# Patient Record
Sex: Male | Born: 1945 | Race: Black or African American | Hispanic: No | Marital: Single | Smoking: Former smoker
Health system: Southern US, Community
[De-identification: ages and names within clinical notes are randomized; demographics above are authoritative.]

## PROBLEM LIST (undated history)

## (undated) DIAGNOSIS — K219 Gastro-esophageal reflux disease without esophagitis: Secondary | ICD-10-CM

## (undated) DIAGNOSIS — N400 Enlarged prostate without lower urinary tract symptoms: Secondary | ICD-10-CM

## (undated) DIAGNOSIS — I10 Essential (primary) hypertension: Secondary | ICD-10-CM

## (undated) DIAGNOSIS — Z860100 Personal history of colon polyps, unspecified: Secondary | ICD-10-CM

## (undated) DIAGNOSIS — J449 Chronic obstructive pulmonary disease, unspecified: Secondary | ICD-10-CM

## (undated) DIAGNOSIS — E7800 Pure hypercholesterolemia, unspecified: Secondary | ICD-10-CM

## (undated) DIAGNOSIS — Z809 Family history of malignant neoplasm, unspecified: Secondary | ICD-10-CM

## (undated) HISTORY — PX: CHOLECYSTECTOMY: SHX55

## (undated) HISTORY — DX: Personal history of colon polyps, unspecified: Z86.0100

## (undated) HISTORY — DX: Personal history of colonic polyps: Z86.010

## (undated) HISTORY — DX: Family history of malignant neoplasm, unspecified: Z80.9

---

## 2005-02-22 ENCOUNTER — Inpatient Hospital Stay: Payer: Self-pay

## 2005-02-22 ENCOUNTER — Other Ambulatory Visit: Payer: Self-pay

## 2005-05-20 ENCOUNTER — Ambulatory Visit: Payer: Self-pay | Admitting: General Practice

## 2013-05-01 ENCOUNTER — Ambulatory Visit: Payer: Self-pay | Admitting: Family Medicine

## 2014-08-12 DIAGNOSIS — N400 Enlarged prostate without lower urinary tract symptoms: Secondary | ICD-10-CM | POA: Insufficient documentation

## 2014-08-12 DIAGNOSIS — N401 Enlarged prostate with lower urinary tract symptoms: Secondary | ICD-10-CM | POA: Insufficient documentation

## 2017-10-03 ENCOUNTER — Other Ambulatory Visit: Payer: Self-pay | Admitting: Nurse Practitioner

## 2017-10-03 DIAGNOSIS — R05 Cough: Secondary | ICD-10-CM

## 2017-10-03 DIAGNOSIS — R053 Chronic cough: Secondary | ICD-10-CM

## 2017-10-10 ENCOUNTER — Ambulatory Visit
Admission: RE | Admit: 2017-10-10 | Discharge: 2017-10-10 | Disposition: A | Discharge status: Home / Self Care | Payer: Commercial Managed Care - HMO | Source: Ambulatory Visit | Attending: Nurse Practitioner | Admitting: Nurse Practitioner

## 2017-10-10 DIAGNOSIS — R05 Cough: Secondary | ICD-10-CM | POA: Diagnosis present

## 2017-10-10 DIAGNOSIS — J449 Chronic obstructive pulmonary disease, unspecified: Secondary | ICD-10-CM | POA: Diagnosis not present

## 2017-10-10 DIAGNOSIS — R053 Chronic cough: Secondary | ICD-10-CM

## 2017-10-10 DIAGNOSIS — I700 Atherosclerosis of aorta: Secondary | ICD-10-CM | POA: Insufficient documentation

## 2018-05-17 ENCOUNTER — Encounter: Payer: Self-pay | Admitting: *Deleted

## 2018-05-17 ENCOUNTER — Telehealth: Payer: Self-pay | Admitting: *Deleted

## 2018-05-17 DIAGNOSIS — Z122 Encounter for screening for malignant neoplasm of respiratory organs: Secondary | ICD-10-CM

## 2018-05-17 NOTE — Telephone Encounter (Signed)
Received a referral for initial lung cancer screening scan.  Contacted the patient and obtained their smoking history, former smoker quit 3 months ago with 42.75pkyr history  as well as answering questions related to screening process.  Patient denies signs of lung cancer such as weight loss or hemoptysis at this time.  Patient denies comorbidity that would prevent curative treatment if lung cancer were found.  Patient is scheduled for the Shared Decision Making Visit and CT scan on 06-15-18@1400 .

## 2018-05-19 ENCOUNTER — Emergency Department
Admission: EM | Admit: 2018-05-19 | Discharge: 2018-05-19 | Disposition: A | Discharge status: Home / Self Care | Payer: Medicare Other | Attending: Emergency Medicine | Admitting: Emergency Medicine

## 2018-05-19 ENCOUNTER — Other Ambulatory Visit: Payer: Self-pay

## 2018-05-19 ENCOUNTER — Emergency Department: Payer: Medicare Other

## 2018-05-19 ENCOUNTER — Encounter: Payer: Self-pay | Admitting: Emergency Medicine

## 2018-05-19 DIAGNOSIS — R0789 Other chest pain: Secondary | ICD-10-CM | POA: Insufficient documentation

## 2018-05-19 DIAGNOSIS — Z87891 Personal history of nicotine dependence: Secondary | ICD-10-CM | POA: Insufficient documentation

## 2018-05-19 DIAGNOSIS — I10 Essential (primary) hypertension: Secondary | ICD-10-CM | POA: Diagnosis not present

## 2018-05-19 HISTORY — DX: Essential (primary) hypertension: I10

## 2018-05-19 HISTORY — DX: Gastro-esophageal reflux disease without esophagitis: K21.9

## 2018-05-19 HISTORY — DX: Pure hypercholesterolemia, unspecified: E78.00

## 2018-05-19 LAB — CBC
HCT: 40.7 % (ref 39.0–52.0)
Hemoglobin: 12.6 g/dL — ABNORMAL LOW (ref 13.0–17.0)
MCH: 23.4 pg — AB (ref 26.0–34.0)
MCHC: 31 g/dL (ref 30.0–36.0)
MCV: 75.5 fL — ABNORMAL LOW (ref 80.0–100.0)
NRBC: 0 % (ref 0.0–0.2)
Platelets: 247 10*3/uL (ref 150–400)
RBC: 5.39 MIL/uL (ref 4.22–5.81)
RDW: 21.3 % — ABNORMAL HIGH (ref 11.5–15.5)
WBC: 4.2 10*3/uL (ref 4.0–10.5)

## 2018-05-19 LAB — BASIC METABOLIC PANEL
ANION GAP: 7 (ref 5–15)
BUN: 22 mg/dL (ref 8–23)
CO2: 24 mmol/L (ref 22–32)
Calcium: 9.1 mg/dL (ref 8.9–10.3)
Chloride: 109 mmol/L (ref 98–111)
Creatinine, Ser: 1.74 mg/dL — ABNORMAL HIGH (ref 0.61–1.24)
GFR calc Af Amer: 44 mL/min — ABNORMAL LOW (ref >60)
GFR, EST NON AFRICAN AMERICAN: 38 mL/min — AB (ref >60)
GLUCOSE: 144 mg/dL — AB (ref 70–99)
POTASSIUM: 3.8 mmol/L (ref 3.5–5.1)
Sodium: 140 mmol/L (ref 135–145)

## 2018-05-19 LAB — TROPONIN I

## 2018-05-19 MED ORDER — HYOSCYAMINE SULFATE 0.125 MG SL SUBL
0.2500 mg | SUBLINGUAL_TABLET | Freq: Once | SUBLINGUAL | Status: AC
  Administered 2018-05-19: 0.25 mg via SUBLINGUAL
  Filled 2018-05-19: qty 2

## 2018-05-19 MED ORDER — ALUM HYDROXIDE-MAG CARBONATE 95-358 MG/15ML PO SUSP: 15.0000 mL | Freq: Three times a day (TID) | ORAL | 2 refills | Status: AC

## 2018-05-19 MED ORDER — LIDOCAINE VISCOUS HCL 2 % MT SOLN
15.0000 mL | Freq: Once | OROMUCOSAL | Status: AC
  Administered 2018-05-19: 15 mL via ORAL
  Filled 2018-05-19: qty 15

## 2018-05-19 MED ORDER — SUCRALFATE 1 GM/10ML PO SUSP: 1.0000 g | Freq: Four times a day (QID) | ORAL | 1 refills | Status: DC

## 2018-05-19 MED ORDER — ALUM & MAG HYDROXIDE-SIMETH 200-200-20 MG/5ML PO SUSP
30.0000 mL | Freq: Once | ORAL | Status: AC
  Administered 2018-05-19: 30 mL via ORAL
  Filled 2018-05-19: qty 30

## 2018-05-19 NOTE — ED Provider Notes (Signed)
[facility] Emergency Department Provider Note   ____________________________________________   First MD Initiated Contact with Patient 05/19/18 [phone]     (approximate)  I have reviewed the triage vital signs and the nursing notes.   HISTORY  Chief Complaint Chest Pain   HPI Donald Foster is a 72 y.o. male patient reports center chest pain which is sharp and burning.  It is woken him up at night several times in the last few nights.  He gets better if he sits up.  It was getting better if he belches before but it is not anymore.  It is not worse or brought on at all by exercise or deep breathing.  In fact deep breathing makes the pain better.  Pain was moderately severe when it was there.  It went away with GI cocktail.  Is not associated with nausea vomiting sweatiness or shortness of breath.  It does not radiate.   Past Medical History:  Diagnosis Date  . GERD (gastroesophageal reflux disease)   . Hypercholesteremia   . Hypertension     There are no active problems to display for this patient.   Past Surgical History:  Procedure Laterality Date  . CHOLECYSTECTOMY      Prior to Admission medications   Medication Sig Start Date End Date Taking? Authorizing Provider  aluminum hydroxide-magnesium carbonate (GAVISCON) 95-358 MG/15ML SUSP Take 15 mLs by mouth 4 (four) times daily - after meals and at bedtime. 05/19/18   [name], MD  sucralfate (CARAFATE) 1 GM/10ML suspension Take 10 mLs (1 g total) by mouth 4 (four) times daily. 05/19/18 05/19/19  [name], MD    Allergies Patient has no known allergies.  No family history on file.  Social History Social History   Tobacco Use  . Smoking status: Former Smoker    Packs/day: 0.75    Years: 57.00    Pack years: 42.75    Types: Cigarettes    Start date: 05/03/2018  . Smokeless tobacco: Never Used  Substance Use Topics  . Alcohol use: Not Currently  . Drug use: Not Currently     Review of Systems  Constitutional: No fever/chills Eyes: No visual changes. ENT: No sore throat. Cardiovascular: See HPI Respiratory: Denies shortness of breath. Gastrointestinal: No abdominal pain.  No nausea, no vomiting.  No diarrhea.  No constipation. Genitourinary: Negative for dysuria. Musculoskeletal: Negative for back pain. Skin: Negative for rash. Neurological: Negative for headaches, focal weakness  ____________________________________________   PHYSICAL EXAM:  VITAL SIGNS: ED Triage Vitals  Enc Vitals Group     BP 05/19/18 0914 (!) 144/76     Pulse Rate 05/19/18 0914 95     Resp 05/19/18 0914 16     Temp 05/19/18 0914 98.1 F (36.7 C)     Temp Source 05/19/18 0914 Oral     SpO2 05/19/18 0914 95 %     Weight 05/19/18 0915 144 lb (65.3 kg)     Height 05/19/18 0915 5\' 8"  (1.727 m)     Head Circumference --      Peak Flow --      Pain Score 05/19/18 0914 8     Pain Loc --      Pain Edu? --      Excl. in [place]? --     Constitutional: Alert and oriented. Well appearing and in no acute distress. Eyes: Conjunctivae are normal.  Head: Atraumatic. Nose: No congestion/rhinnorhea. Mouth/Throat: Mucous membranes are moist.  Oropharynx non-erythematous.  Neck: No stridor.   Cardiovascular: Normal rate, regular rhythm. Grossly normal heart sounds.  Good peripheral circulation.  Pain is partly reproduced by palpation over the costochondral junctions in the mid chest Respiratory: Normal respiratory effort.  No retractions. Lungs CTAB. Gastrointestinal: Soft and nontender. No distention. No abdominal bruits. No CVA tenderness. Musculoskeletal: No lower extremity tenderness nor edema.   Neurologic:  Normal speech and language. No gross focal neurologic deficits are appreciated. No gait instability. Skin:  Skin is warm, dry and intact. No rash noted. Psychiatric: Mood and affect are normal. Speech and behavior are normal.  ____________________________________________    LABS (all labs ordered are listed, but only abnormal results are displayed)  Labs Reviewed  BASIC METABOLIC PANEL - Abnormal; Notable for the following components:      Result Value   Glucose, Bld 144 (*)    Creatinine, Ser 1.74 (*)    GFR calc non Af Amer 38 (*)    GFR calc Af Amer 44 (*)    All other components within normal limits  CBC - Abnormal; Notable for the following components:   Hemoglobin 12.6 (*)    MCV 75.5 (*)    MCH 23.4 (*)    RDW 21.3 (*)    All other components within normal limits  TROPONIN I  HEPATIC FUNCTION PANEL  DIFFERENTIAL   ____________________________________________  EKG  EKG read and interpreted by me shows normal sinus rhythm rate of 100 normal axis no acute ST-T wave changes are 4 PVCs present. ____________________________________________  RADIOLOGY  ED MD interpretation: Chest x-ray read by radiology reviewed by me shows minimal atelectasis  Official radiology report(s): Dg Chest Portable 1 View  Result Date: 05/19/2018 CLINICAL DATA:  Chest pain.  Chronic shortness of breath. EXAM: PORTABLE CHEST 1 VIEW COMPARISON:  10/10/2017 and 05/01/2013 FINDINGS: Heart size and pulmonary vascularity are normal. There is tortuosity and calcification of the brachiocephalic vessels and thoracic aorta. There is slight atelectasis at both lung bases. No effusions. No acute bone abnormality. IMPRESSION: 1. Minimal atelectasis at the lung bases. 2.  Aortic Atherosclerosis (ICD10-I70.0). 3. No other significant abnormalities. Electronically Signed   By: Lorriane Shire M.D.   On: 05/19/2018 12:12    ____________________________________________   PROCEDURES  Procedure(s) performed:   Procedures  Critical Care performed:   ____________________________________________   INITIAL IMPRESSION / ASSESSMENT AND PLAN / ED COURSE  Patient's pain wakes him up at night but it does not come on with any sort of exertion or deep breathing.  It is burning in  nature often sounds like reflux.  He is already being treated for.  I will add some Gaviscon and if that does not work then some Carafate to it.  He will follow-up with his doctor and the GI doctor.  Return if he is worse.      ____________________________________________   FINAL CLINICAL IMPRESSION(S) / ED DIAGNOSES  Final diagnoses:  Other chest pain     ED Discharge Orders         Ordered    sucralfate (CARAFATE) 1 GM/10ML suspension  4 times daily     05/19/18 1144    aluminum hydroxide-magnesium carbonate (GAVISCON) 95-358 MG/15ML SUSP  3 times daily after meals & bedtime     05/19/18 1148           Note:  This document was prepared using Dragon voice recognition software and may include unintentional dictation errors.    [name], MD 05/19/18 [phone]

## 2018-05-19 NOTE — Discharge Instructions (Addendum)
I think your pain is from reflux. Please continue taking the protonix once a day. Please try Gaviscon after meals and especially before bed. You can also take it once a night if the pain wakes you up from sleep.If this doesn't work after 2-3 days try the Carafate instead. Please make sure to take the carafate 2 hours before or after any other medicine because the carafate will absorb other medications and keep them from working. Please follow  up with your doctor and with the gastroenterologist, Dr Marius Ditch. Please return here if you are worse.

## 2018-05-19 NOTE — ED Triage Notes (Signed)
Pt to ED via POV c/o chest pain on and off x 2 weeks. Pt states that the pain has progressively gotten worse to where it is waking him up at night. Pt denies N/V. Pt states that he is short of breath but that this is baseline for him due to COPD. Pt does not feel like his ShOB has gotten any worse. Pt states that the chest pain is located in the center to right of his chest. Pt denies radiation of the pain. Pt is in NAD at this time.

## 2018-05-23 ENCOUNTER — Telehealth: Payer: Self-pay | Admitting: Gastroenterology

## 2018-05-23 NOTE — Telephone Encounter (Signed)
Left vm for pt to call office and schedule ed f/u with Dr. Marius Ditch

## 2018-06-15 ENCOUNTER — Ambulatory Visit: Admission: RE | Admit: 2018-06-15 | Payer: Medicare Other | Source: Ambulatory Visit

## 2018-06-15 ENCOUNTER — Inpatient Hospital Stay: Payer: Medicare Other | Attending: Nurse Practitioner | Admitting: Nurse Practitioner

## 2018-06-20 ENCOUNTER — Encounter: Payer: Self-pay | Admitting: *Deleted

## 2018-07-21 ENCOUNTER — Ambulatory Visit (INDEPENDENT_AMBULATORY_CARE_PROVIDER_SITE_OTHER): Payer: Medicare HMO | Admitting: Gastroenterology

## 2018-07-21 ENCOUNTER — Other Ambulatory Visit
Admission: RE | Admit: 2018-07-21 | Discharge: 2018-07-21 | Disposition: A | Discharge status: Home / Self Care | Payer: Medicare HMO | Source: Ambulatory Visit | Attending: Gastroenterology | Admitting: Gastroenterology

## 2018-07-21 ENCOUNTER — Other Ambulatory Visit: Payer: Self-pay

## 2018-07-21 ENCOUNTER — Encounter: Payer: Self-pay | Admitting: Gastroenterology

## 2018-07-21 VITALS — BP 161/92 | HR 84 | Resp 17 | Wt 145.0 lb

## 2018-07-21 DIAGNOSIS — K219 Gastro-esophageal reflux disease without esophagitis: Secondary | ICD-10-CM

## 2018-07-21 DIAGNOSIS — R0789 Other chest pain: Secondary | ICD-10-CM | POA: Diagnosis not present

## 2018-07-21 DIAGNOSIS — D509 Iron deficiency anemia, unspecified: Secondary | ICD-10-CM

## 2018-07-21 LAB — CBC
HCT: 43 % (ref 39.0–52.0)
Hemoglobin: 13.3 g/dL (ref 13.0–17.0)
MCH: 23.7 pg — ABNORMAL LOW (ref 26.0–34.0)
MCHC: 30.9 g/dL (ref 30.0–36.0)
MCV: 76.6 fL — ABNORMAL LOW (ref 80.0–100.0)
Platelets: 225 10*3/uL (ref 150–400)
RBC: 5.61 MIL/uL (ref 4.22–5.81)
RDW: 19.3 % — ABNORMAL HIGH (ref 11.5–15.5)
WBC: 5 10*3/uL (ref 4.0–10.5)
nRBC: 0 % (ref 0.0–0.2)

## 2018-07-21 LAB — COMPREHENSIVE METABOLIC PANEL
ALT: 24 U/L (ref 0–44)
AST: 24 U/L (ref 15–41)
Albumin: 4.3 g/dL (ref 3.5–5.0)
Alkaline Phosphatase: 58 U/L (ref 38–126)
Anion gap: 7 (ref 5–15)
BUN: 22 mg/dL (ref 8–23)
CO2: 24 mmol/L (ref 22–32)
Calcium: 9.2 mg/dL (ref 8.9–10.3)
Chloride: 107 mmol/L (ref 98–111)
Creatinine, Ser: 1.64 mg/dL — ABNORMAL HIGH (ref 0.61–1.24)
GFR calc Af Amer: 48 mL/min — ABNORMAL LOW (ref >60)
GFR calc non Af Amer: 42 mL/min — ABNORMAL LOW (ref >60)
Glucose, Bld: 97 mg/dL (ref 70–99)
Potassium: 4.2 mmol/L (ref 3.5–5.1)
Sodium: 138 mmol/L (ref 135–145)
Total Bilirubin: 0.7 mg/dL (ref 0.3–1.2)
Total Protein: 7.8 g/dL (ref 6.5–8.1)

## 2018-07-21 LAB — IRON AND TIBC
Iron: 36 ug/dL — ABNORMAL LOW (ref 45–182)
Saturation Ratios: 10 % — ABNORMAL LOW (ref 17.9–39.5)
TIBC: 371 ug/dL (ref 250–450)
UIBC: 335 ug/dL

## 2018-07-21 LAB — VITAMIN B12: Vitamin B-12: 305 pg/mL (ref 180–914)

## 2018-07-21 LAB — FERRITIN: Ferritin: 14 ng/mL — ABNORMAL LOW (ref 24–336)

## 2018-07-21 LAB — FOLATE: Folate: 17.2 ng/mL (ref >5.9)

## 2018-07-21 NOTE — Progress Notes (Signed)
[name], MD [address]  [place]  [place], [place] [address]  Main: [phone]  Fax: [phone]    Gastroenterology Consultation  Referring Provider:     Elisabeth Cara, NP Primary Care Physician:  Elisabeth Cara, NP Primary Gastroenterologist:  Dr. [name] Reason for Consultation:     Atypical chest pain, heartburn        HPI:   BACH ROCCHI is a 73 y.o. male referred by Dr. Elisabeth Cara, NP  for consultation & management of atypical chest pain and heartburn.  Patient was recently seen in the ER in 05/2018 secondary to chest pain.  EKG, troponins were negative.  Therefore, he was referred to GI for further evaluation.  He was given Gaviscon in the ER. Patient is currently taking omeprazole 20 mg daily.  He also reports heartburn and regurgitation which is relieved by taking PPI daily.  He denies dysphagia, weight loss.  He denies any other GI symptoms  NSAIDs: None  Antiplts/Anticoagulants/Anti thrombotics: None  GI Procedures:  Reports having stool card test annually and has been negative to date  Colonoscopy in 08/1990 at [facility] The procedure impressions are; 6 polyps, 1 mm in diameter to 10 mm in diameter, from the transverse colon, the splenic flexure, and the sigmoid colon. DIAGNOSIS COLON, PROXIMAL TRANSVERSE, BIOPSY - ADENOMATOUS POLYP COLON, SPLENIC FLEXURE, BIOPSY - ADENOMATOUS POLYPS COLON, SIGMOID, BIOPSY - ADENOMATOUS POLYPS  Colonoscopy 09/1991 The patient is a 73 year old black male who undergoes colonoscopy. Snare polypectomy of a 4x4 mm polyp at the hepatic flexure is performed.  DIAGNOSIS COLON, ENDOSCOPIC BIOPSY - ADENOMATOUS POLYP, 4 x 4 millimeter   Past Medical History:  Diagnosis Date  . GERD (gastroesophageal reflux disease)   . Hypercholesteremia   . Hypertension     Past Surgical History:  Procedure Laterality Date  . CHOLECYSTECTOMY      Current Outpatient Medications:  .  aluminum  hydroxide-magnesium carbonate (GAVISCON) 95-358 MG/15ML SUSP, Take 15 mLs by mouth 4 (four) times daily - after meals and at bedtime., Disp: 1 Bottle, Rfl: 2 .  amLODipine (NORVASC) 10 MG tablet, , Disp: , Rfl:  .  atorvastatin (LIPITOR) 20 MG tablet, , Disp: , Rfl:  .  lisinopril (PRINIVIL,ZESTRIL) 20 MG tablet, , Disp: , Rfl:  .  losartan (COZAAR) 50 MG tablet, , Disp: , Rfl:  .  omeprazole (PRILOSEC) 20 MG capsule, , Disp: , Rfl:  .  pantoprazole (PROTONIX) 40 MG tablet, , Disp: , Rfl:  .  SPIRIVA HANDIHALER 18 MCG inhalation capsule, , Disp: , Rfl:  .  tamsulosin (FLOMAX) 0.4 MG CAPS capsule, , Disp: , Rfl:  .  amLODipine (NORVASC) 5 MG tablet, , Disp: , Rfl:  .  sucralfate (CARAFATE) 1 GM/10ML suspension, Take 10 mLs (1 g total) by mouth 4 (four) times daily. (Patient not taking: Reported on 07/21/2018), Disp: 420 mL, Rfl: 1 .  tamsulosin (FLOMAX) 0.4 MG CAPS capsule, Take by mouth., Disp: , Rfl:   No family history on file.   Social History   Tobacco Use  . Smoking status: Former Smoker    Packs/day: 0.75    Years: 57.00    Pack years: 42.75    Types: Cigarettes    Start date: 05/03/2018  . Smokeless tobacco: Never Used  Substance Use Topics  . Alcohol use: Not Currently  . Drug use: Not Currently    Allergies as of 07/21/2018  . (No Known Allergies)    Review of Systems:  All systems reviewed and negative except where noted in HPI.   Physical Exam:  BP (!) 161/92 (BP Location: Left Arm, Patient Position: Sitting, Cuff Size: Large)   Pulse 84   Resp 17   Wt 145 lb (65.8 kg)   BMI 22.05 kg/m  No LMP for male patient.  General:   Alert,  Well-developed, well-nourished, pleasant and cooperative in NAD Head:  Normocephalic and atraumatic. Eyes:  Sclera clear, no icterus.   Conjunctiva pink. Ears:  Normal auditory acuity. Nose:  No deformity, discharge, or lesions. Mouth:  No deformity or lesions,oropharynx pink & moist. Neck:  Supple; no masses or  thyromegaly. Lungs:  Respirations even and unlabored.  Clear throughout to auscultation.   No wheezes, crackles, or rhonchi. No acute distress. Heart:  Regular rate and rhythm; no murmurs, clicks, rubs, or gallops. Abdomen:  Normal bowel sounds. Soft, non-tender and non-distended without masses, hepatosplenomegaly or hernias noted.  No guarding or rebound tenderness.   Rectal: Not performed Msk:  Symmetrical without gross deformities. Good, equal movement & strength bilaterally. Pulses:  Normal pulses noted. Extremities:  No clubbing or edema.  No cyanosis. Neurologic:  Alert and oriented x3;  grossly normal neurologically. Skin:  Intact without significant lesions or rashes. No jaundice. Psych:  Alert and cooperative. Normal mood and affect.  Imaging Studies: No abdominal imaging  Assessment and Plan:   BUBBER ROTHERT is a 73 y.o. African-American male with history of hypertension, BPH, adenomatous polyps of the colon presents with atypical chest pain, heartburn and regurgitation, maintained on daily PPI.  Patient is referred from ER due to atypical chest pain which is ongoing despite being on PPI  Atypical chest pain with GERD: Recommend EGD for further evaluation Continue omeprazole 20mg  daily  Adenomatous polyps of the colon based on colonoscopy from [place] in 1993 at [facility] Patient reports that he has been receiving annual stool card test by mail and has been negative to date.  He just performed it about 1-2 months ago.  I do not have results of the stool card test with me today  I will discuss with patient about surveillance colonoscopy given his history of adenomatous polyps in the past  Microcytic anemia CBC, iron studies, B12 and folate levels He will need colonoscopy along with EGD if he has iron deficiency  Follow up in 2 months   [name], MD

## 2018-07-27 ENCOUNTER — Telehealth: Payer: Self-pay | Admitting: Gastroenterology

## 2018-07-27 NOTE — Telephone Encounter (Signed)
Pt sister is calling to set up colonoscopy for pt while having another procedure  Please call her back

## 2018-08-01 ENCOUNTER — Encounter
Admission: RE | Disposition: A | Discharge status: Home / Self Care | Payer: Self-pay | Source: Home / Self Care | Attending: Gastroenterology

## 2018-08-01 ENCOUNTER — Ambulatory Visit
Admission: RE | Admit: 2018-08-01 | Discharge: 2018-08-01 | Disposition: A | Discharge status: Home / Self Care | Payer: Medicare HMO | Attending: Gastroenterology | Admitting: Gastroenterology

## 2018-08-01 ENCOUNTER — Ambulatory Visit: Payer: Medicare HMO | Admitting: Anesthesiology

## 2018-08-01 ENCOUNTER — Other Ambulatory Visit: Payer: Self-pay

## 2018-08-01 ENCOUNTER — Encounter: Payer: Self-pay | Admitting: *Deleted

## 2018-08-01 DIAGNOSIS — K449 Diaphragmatic hernia without obstruction or gangrene: Secondary | ICD-10-CM | POA: Insufficient documentation

## 2018-08-01 DIAGNOSIS — I10 Essential (primary) hypertension: Secondary | ICD-10-CM | POA: Insufficient documentation

## 2018-08-01 DIAGNOSIS — K31811 Angiodysplasia of stomach and duodenum with bleeding: Secondary | ICD-10-CM | POA: Diagnosis not present

## 2018-08-01 DIAGNOSIS — K219 Gastro-esophageal reflux disease without esophagitis: Secondary | ICD-10-CM | POA: Insufficient documentation

## 2018-08-01 DIAGNOSIS — D509 Iron deficiency anemia, unspecified: Secondary | ICD-10-CM | POA: Diagnosis not present

## 2018-08-01 DIAGNOSIS — E7800 Pure hypercholesterolemia, unspecified: Secondary | ICD-10-CM | POA: Diagnosis not present

## 2018-08-01 DIAGNOSIS — Z79899 Other long term (current) drug therapy: Secondary | ICD-10-CM | POA: Diagnosis not present

## 2018-08-01 DIAGNOSIS — R0789 Other chest pain: Secondary | ICD-10-CM | POA: Insufficient documentation

## 2018-08-01 DIAGNOSIS — K31819 Angiodysplasia of stomach and duodenum without bleeding: Secondary | ICD-10-CM | POA: Diagnosis present

## 2018-08-01 DIAGNOSIS — K222 Esophageal obstruction: Secondary | ICD-10-CM | POA: Insufficient documentation

## 2018-08-01 DIAGNOSIS — Z87891 Personal history of nicotine dependence: Secondary | ICD-10-CM | POA: Insufficient documentation

## 2018-08-01 DIAGNOSIS — D500 Iron deficiency anemia secondary to blood loss (chronic): Secondary | ICD-10-CM | POA: Diagnosis not present

## 2018-08-01 HISTORY — PX: ESOPHAGOGASTRODUODENOSCOPY (EGD) WITH PROPOFOL: SHX5813

## 2018-08-01 SURGERY — ESOPHAGOGASTRODUODENOSCOPY (EGD) WITH PROPOFOL
Anesthesia: General

## 2018-08-01 MED ORDER — LIDOCAINE HCL (CARDIAC) PF 100 MG/5ML IV SOSY
PREFILLED_SYRINGE | INTRAVENOUS | Status: DC | PRN
  Administered 2018-08-01: 50 mg via INTRAVENOUS

## 2018-08-01 MED ORDER — PROPOFOL 10 MG/ML IV BOLUS
INTRAVENOUS | Status: DC | PRN
  Administered 2018-08-01: 80 mg via INTRAVENOUS

## 2018-08-01 MED ORDER — PROPOFOL 500 MG/50ML IV EMUL
INTRAVENOUS | Status: DC | PRN
  Administered 2018-08-01: 130 ug/kg/min via INTRAVENOUS

## 2018-08-01 MED ORDER — SODIUM CHLORIDE 0.9 % IV SOLN
INTRAVENOUS | Status: DC
  Administered 2018-08-01: 13:00:00 via INTRAVENOUS

## 2018-08-01 MED ORDER — MIDAZOLAM HCL 5 MG/5ML IJ SOLN
INTRAMUSCULAR | Status: AC
  Filled 2018-08-01: qty 5

## 2018-08-01 NOTE — Anesthesia Post-op Follow-up Note (Signed)
Anesthesia QCDR form completed.        

## 2018-08-01 NOTE — H&P (Signed)
Donald Darby, MD [address]  [place]  [place], [place] [address]  Main: [phone]  Fax: [phone] Pager: [phone]  Primary Care Physician:  Elisabeth Cara, NP Primary Gastroenterologist:  Dr. Cephas Foster  Pre-Procedure History & Physical: HPI:  Donald Foster is a 73 y.o. male is here for an endoscopy.   Past Medical History:  Diagnosis Date  . GERD (gastroesophageal reflux disease)   . Hypercholesteremia   . Hypertension     Past Surgical History:  Procedure Laterality Date  . CHOLECYSTECTOMY      Prior to Admission medications   Medication Sig Start Date End Date Taking? Authorizing Provider  aluminum hydroxide-magnesium carbonate (GAVISCON) 95-358 MG/15ML SUSP Take 15 mLs by mouth 4 (four) times daily - after meals and at bedtime. 05/19/18  Yes [name], MD  atorvastatin (LIPITOR) 20 MG tablet  05/03/18  Yes [provider]  lisinopril (PRINIVIL,ZESTRIL) 20 MG tablet  05/11/13  Yes [provider]  losartan (COZAAR) 50 MG tablet  05/03/18  Yes [provider]  omeprazole (PRILOSEC) 20 MG capsule  05/03/18  Yes [provider]  SPIRIVA HANDIHALER 18 MCG inhalation capsule  05/05/18  Yes [provider]  tamsulosin (FLOMAX) 0.4 MG CAPS capsule Take by mouth. 09/23/14  Yes [provider]  amLODipine (NORVASC) 10 MG tablet  05/03/18   [provider]  amLODipine (NORVASC) 5 MG tablet  06/04/14   [provider]  pantoprazole (PROTONIX) 40 MG tablet  05/11/13   [provider]  sucralfate (CARAFATE) 1 GM/10ML suspension Take 10 mLs (1 g total) by mouth 4 (four) times daily. Patient not taking: Reported on 07/21/2018 05/19/18 05/19/19  [name], MD  tamsulosin [facility]) 0.4 MG CAPS capsule  05/03/18   [provider]    Allergies as of 07/21/2018  . (No Known Allergies)    History reviewed. No pertinent family history.  Social History    Socioeconomic History  . Marital status: Single    Spouse name: Not on file  . Number of children: Not on file  . Years of education: Not on file  . Highest education level: Not on file  Occupational History  . Not on file  Social Needs  . Financial resource strain: Not on file  . Food insecurity:    Worry: Not on file    Inability: Not on file  . Transportation needs:    Medical: Not on file    Non-medical: Not on file  Tobacco Use  . Smoking status: Former Smoker    Packs/day: 0.75    Years: 57.00    Pack years: 42.75    Types: Cigarettes    Start date: 05/03/2018  . Smokeless tobacco: Never Used  Substance and Sexual Activity  . Alcohol use: Not Currently  . Drug use: Not Currently  . Sexual activity: Not on file  Lifestyle  . Physical activity:    Days per week: Not on file    Minutes per session: Not on file  . Stress: Not on file  Relationships  . Social connections:    Talks on phone: Not on file    Gets together: Not on file    Attends religious service: Not on file    Active member of club or organization: Not on file    Attends meetings of clubs or organizations: Not on file    Relationship status: Not on file  . Intimate partner violence:    Fear  of current or ex partner: Not on file    Emotionally abused: Not on file    Physically abused: Not on file    Forced sexual activity: Not on file  Other Topics Concern  . Not on file  Social History Narrative  . Not on file    Review of Systems: See HPI, otherwise negative ROS  Physical Exam: BP (!) 165/81   Pulse 75   Temp (!) 97.4 F (36.3 C) (Tympanic)   Resp 17   Ht 5\' 8"  (1.727 m)   Wt 65.3 kg   SpO2 98%   BMI 21.90 kg/m  General:   Alert,  pleasant and cooperative in NAD Head:  Normocephalic and atraumatic. Neck:  Supple; no masses or thyromegaly. Lungs:  Clear throughout to auscultation.    Heart:  Regular rate and rhythm. Abdomen:  Soft, nontender and nondistended. Normal bowel  sounds, without guarding, and without rebound.   Neurologic:  Alert and  oriented x4;  grossly normal neurologically.  Impression/Plan: Donald Foster is here for an endoscopy to be performed for atypical chest pain  Risks, benefits, limitations, and alternatives regarding  endoscopy have been reviewed with the patient.  Questions have been answered.  All parties agreeable.   Sherri Sear, MD  08/01/2018, 1:32 PM

## 2018-08-01 NOTE — Anesthesia Postprocedure Evaluation (Signed)
Anesthesia Post Note  Patient: Donald Foster [facility]  Procedure(s) Performed: ESOPHAGOGASTRODUODENOSCOPY (EGD) WITH PROPOFOL (N/A )  Patient location during evaluation: Endoscopy Anesthesia Type: General Level of consciousness: awake and alert and oriented Pain management: pain level controlled Vital Signs Assessment: post-procedure vital signs reviewed and stable Respiratory status: spontaneous breathing, nonlabored ventilation and respiratory function stable Cardiovascular status: blood pressure returned to baseline and stable Postop Assessment: no signs of nausea or vomiting Anesthetic complications: no     Last Vitals:  Vitals:   08/01/18 1419 08/01/18 1429  BP: 130/75 (!) 146/79  Pulse: 62 66  Resp: 17 19  Temp:    SpO2: 95% 97%    Last Pain:  Vitals:   08/01/18 1429  TempSrc:   PainSc: 0-No pain                 [name]

## 2018-08-01 NOTE — Transfer of Care (Signed)
Immediate Anesthesia Transfer of Care Note  Patient: Donald Foster [facility]  Procedure(s) Performed: ESOPHAGOGASTRODUODENOSCOPY (EGD) WITH PROPOFOL (N/A )  Patient Location: PACU and Endoscopy Unit  Anesthesia Type:General  Level of Consciousness: drowsy  Airway & Oxygen Therapy: Patient Spontanous Breathing  Post-op Assessment: Report given to RN and Post -op Vital signs reviewed and stable  Post vital signs: Reviewed and stable  Last Vitals:  Vitals Value Taken Time  BP 98/49 08/01/2018  2:00 PM  Temp 36.2 C 08/01/2018  1:59 PM  Pulse 61 08/01/2018  2:00 PM  Resp 15 08/01/2018  2:00 PM  SpO2 95 % 08/01/2018  2:00 PM  Vitals shown include unvalidated device data.  Last Pain:  Vitals:   08/01/18 1359  TempSrc: Tympanic  PainSc: Asleep         Complications: No apparent anesthesia complications

## 2018-08-01 NOTE — Op Note (Signed)
[facility] Gastroenterology Patient Name: Donald Foster Procedure Date: 08/01/2018 1:33 PM MRN: [id number] Account #: [id number] Date of Birth: 03-19-1946 Admit Type: Outpatient Age: 73 Room: [facility] ENDO ROOM 4 Gender: Male Note Status: Finalized Procedure:            Upper GI endoscopy Indications:          Unexplained iron deficiency anemia, Chest pain (non                        cardiac) Providers:            Lin Landsman MD, MD Medicines:            Monitored Anesthesia Care Complications:        No immediate complications. Estimated blood loss: None. Procedure:            Pre-Anesthesia Assessment:                       - Prior to the procedure, a History and Physical was                        performed, and patient medications and allergies were                        reviewed. The patient is competent. The risks and                        benefits of the procedure and the sedation options and                        risks were discussed with the patient. All questions                        were answered and informed consent was obtained.                        Patient identification and proposed procedure were                        verified by the physician, the nurse, the                        anesthesiologist, the anesthetist and the technician in                        the pre-procedure area in the procedure room in the                        endoscopy suite. Mental Status Examination: alert and                        oriented. Airway Examination: normal oropharyngeal                        airway and neck mobility. Respiratory Examination:                        clear to auscultation. CV Examination: normal.  Prophylactic Antibiotics: The patient does not require                        prophylactic antibiotics. Prior Anticoagulants: The                        patient has taken no previous anticoagulant or        antiplatelet agents. ASA Grade Assessment: II - A                        patient with mild systemic disease. After reviewing the                        risks and benefits, the patient was deemed in                        satisfactory condition to undergo the procedure. The                        anesthesia plan was to use monitored anesthesia care                        (MAC). Immediately prior to administration of                        medications, the patient was re-assessed for adequacy                        to receive sedatives. The heart rate, respiratory rate,                        oxygen saturations, blood pressure, adequacy of                        pulmonary ventilation, and response to care were                        monitored throughout the procedure. The physical status                        of the patient was re-assessed after the procedure.                       After obtaining informed consent, the endoscope was                        passed under direct vision. Throughout the procedure,                        the patient's blood pressure, pulse, and oxygen                        saturations were monitored continuously. The Endoscope                        was introduced through the mouth, and advanced to the                        second part of duodenum. The upper GI endoscopy was  accomplished without difficulty. The patient tolerated                        the procedure well. Findings:      A single 6 mm angioectasia with bleeding on contact was found in the       second portion of the duodenum. Coagulation for hemostasis using argon       plasma was successful. For hemostasis, two hemostatic clips were       successfully placed (MR conditional). There was no bleeding at the end       of the procedure.      The entire examined stomach was normal.      The cardia and gastric fundus were normal on retroflexion.      A 1 cm hiatal hernia was  present.      A widely patent Schatzki ring was found at the gastroesophageal junction.      The examined esophagus was normal. Impression:           - A single angioectasia in the duodenum. Treated with                        argon plasma coagulation (APC). Clips (MR conditional)                        were placed.                       - Normal stomach.                       - 1 cm hiatal hernia.                       - Widely patent Schatzki ring.                       - Normal esophagus.                       - No specimens collected. Recommendation:       - Discharge patient to home (with escort).                       - Resume previous diet today.                       - Continue present medications.                       - Perform a colonoscopy at appointment to be scheduled.                       - Ferrous sulfate at 325 mg orally daily for 6 months.                       - Return to my office as previously scheduled. Procedure Code(s):    --- Professional ---                       [phone], Esophagogastroduodenoscopy, flexible, transoral;                        with control of bleeding, any  method Diagnosis Code(s):    --- Professional ---                       M75.449, Angiodysplasia of stomach and duodenum without                        bleeding                       K44.9, Diaphragmatic hernia without obstruction or                        gangrene                       K22.2, Esophageal obstruction                       D50.9, Iron deficiency anemia, unspecified                       R07.89, Other chest pain CPT copyright 2018 American Medical Association. All rights reserved. The codes documented in this report are preliminary and upon coder review may  be revised to meet current compliance requirements. Dr. Ulyess Mort Lin Landsman MD, MD 08/01/2018 2:00:36 PM This report has been signed electronically. Number of Addenda: 0 Note Initiated On: 08/01/2018 1:33 PM       [facility]

## 2018-08-01 NOTE — Anesthesia Preprocedure Evaluation (Signed)
Anesthesia Evaluation  Patient identified by MRN, date of birth, ID band Patient awake    Reviewed: Allergy & Precautions, NPO status , Patient's Chart, lab work & pertinent test results  History of Anesthesia Complications Negative for: history of anesthetic complications  Airway Mallampati: II  TM Distance: >3 FB Neck ROM: Full    Dental  (+) Lower Dentures, Upper Dentures   Pulmonary neg sleep apnea, COPD,  COPD inhaler, former smoker,    breath sounds clear to auscultation- rhonchi (-) wheezing      Cardiovascular Exercise Tolerance: Good hypertension, Pt. on medications (-) CAD, (-) Past MI, (-) Cardiac Stents and (-) CABG  Rhythm:Regular Rate:Normal - Systolic murmurs and - Diastolic murmurs    Neuro/Psych neg Seizures negative neurological ROS  negative psych ROS   GI/Hepatic Neg liver ROS, GERD  ,  Endo/Other  negative endocrine ROSneg diabetes  Renal/GU Renal InsufficiencyRenal disease     Musculoskeletal negative musculoskeletal ROS (+)   Abdominal (+) - obese,   Peds  Hematology negative hematology ROS (+)   Anesthesia Other Findings Past Medical History: No date: GERD (gastroesophageal reflux disease) No date: Hypercholesteremia No date: Hypertension   Reproductive/Obstetrics                             Anesthesia Physical Anesthesia Plan  ASA: III  Anesthesia Plan: General   Post-op Pain Management:    Induction: Intravenous  PONV Risk Score and Plan: 1 and Propofol infusion  Airway Management Planned: Natural Airway  Additional Equipment:   Intra-op Plan:   Post-operative Plan:   Informed Consent: I have reviewed the patients History and Physical, chart, labs and discussed the procedure including the risks, benefits and alternatives for the proposed anesthesia with the patient or authorized representative who has indicated his/her understanding and  acceptance.     Dental advisory given  Plan Discussed with: CRNA and Anesthesiologist  Anesthesia Plan Comments:         Anesthesia Quick Evaluation

## 2018-08-02 ENCOUNTER — Encounter: Payer: Self-pay | Admitting: Gastroenterology

## 2018-08-02 ENCOUNTER — Other Ambulatory Visit: Payer: Self-pay

## 2018-08-02 DIAGNOSIS — D509 Iron deficiency anemia, unspecified: Secondary | ICD-10-CM

## 2018-08-02 NOTE — Telephone Encounter (Signed)
Procedure has been scheduled for 08/17/2018 and pt has been notified and verbalized understanding

## 2018-08-17 ENCOUNTER — Encounter
Admission: RE | Disposition: A | Discharge status: Home / Self Care | Payer: Self-pay | Source: Home / Self Care | Attending: Gastroenterology

## 2018-08-17 ENCOUNTER — Ambulatory Visit
Admission: RE | Admit: 2018-08-17 | Discharge: 2018-08-17 | Disposition: A | Discharge status: Home / Self Care | Payer: Medicare HMO | Attending: Gastroenterology | Admitting: Gastroenterology

## 2018-08-17 ENCOUNTER — Encounter: Payer: Self-pay | Admitting: Certified Registered Nurse Anesthetist

## 2018-08-17 ENCOUNTER — Other Ambulatory Visit: Payer: Self-pay

## 2018-08-17 ENCOUNTER — Ambulatory Visit: Payer: Medicare HMO | Admitting: Certified Registered Nurse Anesthetist

## 2018-08-17 DIAGNOSIS — K219 Gastro-esophageal reflux disease without esophagitis: Secondary | ICD-10-CM | POA: Diagnosis not present

## 2018-08-17 DIAGNOSIS — K644 Residual hemorrhoidal skin tags: Secondary | ICD-10-CM | POA: Diagnosis not present

## 2018-08-17 DIAGNOSIS — K6289 Other specified diseases of anus and rectum: Secondary | ICD-10-CM

## 2018-08-17 DIAGNOSIS — K3189 Other diseases of stomach and duodenum: Secondary | ICD-10-CM | POA: Insufficient documentation

## 2018-08-17 DIAGNOSIS — K629 Disease of anus and rectum, unspecified: Secondary | ICD-10-CM

## 2018-08-17 DIAGNOSIS — I10 Essential (primary) hypertension: Secondary | ICD-10-CM | POA: Insufficient documentation

## 2018-08-17 DIAGNOSIS — K449 Diaphragmatic hernia without obstruction or gangrene: Secondary | ICD-10-CM | POA: Insufficient documentation

## 2018-08-17 DIAGNOSIS — K5730 Diverticulosis of large intestine without perforation or abscess without bleeding: Secondary | ICD-10-CM | POA: Insufficient documentation

## 2018-08-17 DIAGNOSIS — Z79899 Other long term (current) drug therapy: Secondary | ICD-10-CM | POA: Diagnosis not present

## 2018-08-17 DIAGNOSIS — D125 Benign neoplasm of sigmoid colon: Secondary | ICD-10-CM | POA: Insufficient documentation

## 2018-08-17 DIAGNOSIS — Z87891 Personal history of nicotine dependence: Secondary | ICD-10-CM | POA: Diagnosis not present

## 2018-08-17 DIAGNOSIS — E7800 Pure hypercholesterolemia, unspecified: Secondary | ICD-10-CM | POA: Insufficient documentation

## 2018-08-17 DIAGNOSIS — D123 Benign neoplasm of transverse colon: Secondary | ICD-10-CM | POA: Diagnosis not present

## 2018-08-17 DIAGNOSIS — D509 Iron deficiency anemia, unspecified: Secondary | ICD-10-CM | POA: Insufficient documentation

## 2018-08-17 DIAGNOSIS — R0789 Other chest pain: Secondary | ICD-10-CM | POA: Diagnosis not present

## 2018-08-17 DIAGNOSIS — D122 Benign neoplasm of ascending colon: Secondary | ICD-10-CM | POA: Insufficient documentation

## 2018-08-17 DIAGNOSIS — K269 Duodenal ulcer, unspecified as acute or chronic, without hemorrhage or perforation: Secondary | ICD-10-CM | POA: Diagnosis not present

## 2018-08-17 DIAGNOSIS — K2980 Duodenitis without bleeding: Secondary | ICD-10-CM | POA: Diagnosis not present

## 2018-08-17 DIAGNOSIS — R12 Heartburn: Secondary | ICD-10-CM

## 2018-08-17 HISTORY — PX: COLONOSCOPY WITH PROPOFOL: SHX5780

## 2018-08-17 HISTORY — PX: UPPER GI ENDOSCOPY: SHX6162

## 2018-08-17 SURGERY — COLONOSCOPY WITH PROPOFOL
Anesthesia: General

## 2018-08-17 MED ORDER — LIDOCAINE HCL (CARDIAC) PF 100 MG/5ML IV SOSY
PREFILLED_SYRINGE | INTRAVENOUS | Status: DC | PRN
  Administered 2018-08-17: 50 mg via INTRAVENOUS

## 2018-08-17 MED ORDER — PROPOFOL 500 MG/50ML IV EMUL
INTRAVENOUS | Status: AC
  Filled 2018-08-17: qty 50

## 2018-08-17 MED ORDER — SODIUM CHLORIDE 0.9 % IV SOLN
INTRAVENOUS | Status: DC
  Administered 2018-08-17: 1000 mL via INTRAVENOUS

## 2018-08-17 MED ORDER — OMEPRAZOLE 40 MG PO CPDR: 40.0000 mg | DELAYED_RELEASE_CAPSULE | Freq: Two times a day (BID) | ORAL | 0 refills | Status: DC

## 2018-08-17 MED ORDER — PROPOFOL 500 MG/50ML IV EMUL
INTRAVENOUS | Status: DC | PRN
  Administered 2018-08-17: 135 ug/kg/min via INTRAVENOUS

## 2018-08-17 MED ORDER — LIDOCAINE HCL (PF) 2 % IJ SOLN
INTRAMUSCULAR | Status: AC
  Filled 2018-08-17: qty 10

## 2018-08-17 MED ORDER — PROPOFOL 10 MG/ML IV BOLUS
INTRAVENOUS | Status: DC | PRN
  Administered 2018-08-17: 20 mg via INTRAVENOUS
  Administered 2018-08-17: 80 mg via INTRAVENOUS

## 2018-08-17 MED ORDER — PHENYLEPHRINE HCL 10 MG/ML IJ SOLN
INTRAMUSCULAR | Status: DC | PRN
  Administered 2018-08-17 (×2): 100 ug via INTRAVENOUS

## 2018-08-17 NOTE — Progress Notes (Signed)
Referral to general surgery has been placed

## 2018-08-17 NOTE — Anesthesia Preprocedure Evaluation (Signed)
Anesthesia Evaluation  Patient identified by MRN, date of birth, ID band Patient awake    Reviewed: Allergy & Precautions, H&P , NPO status , Patient's Chart, lab work & pertinent test results, reviewed documented beta blocker date and time   Airway Mallampati: II   Neck ROM: full    Dental  (+) Poor Dentition   Pulmonary neg pulmonary ROS, former smoker,    Pulmonary exam normal        Cardiovascular Exercise Tolerance: Good hypertension, On Medications negative cardio ROS Normal cardiovascular exam Rhythm:regular Rate:Normal     Neuro/Psych negative neurological ROS  negative psych ROS   GI/Hepatic Neg liver ROS, GERD  Medicated,  Endo/Other  negative endocrine ROS  Renal/GU negative Renal ROS  negative genitourinary   Musculoskeletal   Abdominal   Peds  Hematology negative hematology ROS (+)   Anesthesia Other Findings Past Medical History: No date: GERD (gastroesophageal reflux disease) No date: Hypercholesteremia No date: Hypertension Past Surgical History: No date: CHOLECYSTECTOMY 08/01/2018: ESOPHAGOGASTRODUODENOSCOPY (EGD) WITH PROPOFOL; N/A     Comment:  Procedure: ESOPHAGOGASTRODUODENOSCOPY (EGD) WITH               PROPOFOL;  Surgeon: Lin Landsman, MD;  Location:               [facility] ENDOSCOPY;  Service: Gastroenterology;  Laterality:               N/A; BMI    Body Mass Index:  22.05 kg/m     Reproductive/Obstetrics negative OB ROS                             Anesthesia Physical Anesthesia Plan  ASA: II  Anesthesia Plan: General   Post-op Pain Management:    Induction:   PONV Risk Score and Plan:   Airway Management Planned:   Additional Equipment:   Intra-op Plan:   Post-operative Plan:   Informed Consent: I have reviewed the patients History and Physical, chart, labs and discussed the procedure including the risks, benefits and alternatives for  the proposed anesthesia with the patient or authorized representative who has indicated his/her understanding and acceptance.     Dental Advisory Given  Plan Discussed with: CRNA  Anesthesia Plan Comments:         Anesthesia Quick Evaluation

## 2018-08-17 NOTE — Op Note (Signed)
[facility] Gastroenterology Patient Name: Donald Foster Procedure Date: 08/17/2018 10:15 AM MRN: [id number] Account #: [id number] Date of Birth: [birth year] Admit Type: Outpatient Age: 73 Room: [facility] ENDO ROOM 1 Gender: Male Note Status: Finalized Procedure:            Upper GI endoscopy Indications:          Unexplained iron deficiency anemia, Heartburn, Chest                        pain (non cardiac) Providers:            Lin Landsman MD, MD Referring MD:         Elisabeth Cara Medicines:            Monitored Anesthesia Care Complications:        No immediate complications. Estimated blood loss:                        Minimal. Procedure:            Pre-Anesthesia Assessment:                       - Prior to the procedure, a History and Physical was                        performed, and patient medications and allergies were                        reviewed. The patient is competent. The risks and                        benefits of the procedure and the sedation options and                        risks were discussed with the patient. All questions                        were answered and informed consent was obtained.                        Patient identification and proposed procedure were                        verified by the physician, the nurse, the                        anesthesiologist, the anesthetist and the technician in                        the pre-procedure area in the procedure room in the                        endoscopy suite. Mental Status Examination: alert and                        oriented. Airway Examination: normal oropharyngeal                        airway and neck mobility. Respiratory Examination:  clear to auscultation. CV Examination: normal.                        Prophylactic Antibiotics: The patient does not require                        prophylactic antibiotics. Prior Anticoagulants: The                         patient has taken no previous anticoagulant or                        antiplatelet agents. ASA Grade Assessment: II - A                        patient with mild systemic disease. After reviewing the                        risks and benefits, the patient was deemed in                        satisfactory condition to undergo the procedure. The                        anesthesia plan was to use monitored anesthesia care                        (MAC). Immediately prior to administration of                        medications, the patient was re-assessed for adequacy                        to receive sedatives. The heart rate, respiratory rate,                        oxygen saturations, blood pressure, adequacy of                        pulmonary ventilation, and response to care were                        monitored throughout the procedure. The physical status                        of the patient was re-assessed after the procedure.                       After obtaining informed consent, the endoscope was                        passed under direct vision. Throughout the procedure,                        the patient's blood pressure, pulse, and oxygen                        saturations were monitored continuously. The Endoscope  was introduced through the mouth, and advanced to the                        second part of duodenum. The upper GI endoscopy was                        accomplished without difficulty. The patient tolerated                        the procedure well. Findings:      One non-bleeding cratered duodenal ulcer with a clean ulcer base       (Forrest Class III) was found in the second portion of the duodenum. The       lesion was 6 mm in largest dimension. Biopsies were taken with a cold       forceps for histology.      The duodenal bulb was normal.      Diffuse mildly erythematous mucosa without bleeding was found in the       gastric body.  Biopsies were taken with a cold forceps for Helicobacter       pylori testing.      The gastric antrum was normal. Biopsies were taken with a cold forceps       for Helicobacter pylori testing.      A 1 cm hiatal hernia was present.      The gastroesophageal junction and examined esophagus were normal. Impression:           - One non-bleeding duodenal ulcer with a clean ulcer                        base (Forrest Class III), likely source of iron                        deficiency anemia. Biopsied.                       - Normal duodenal bulb.                       - Erythematous mucosa in the gastric body. Biopsied.                       - Normal antrum. Biopsied.                       - 1 cm hiatal hernia.                       - Normal gastroesophageal junction and esophagus. Recommendation:       - Await pathology results.                       - Use Prilosec (omeprazole) 40 mg PO BID for 3 months.                       - Proceed with colonoscopy as scheduled                       See colonoscopy report Procedure Code(s):    --- Professional ---  67672, Esophagogastroduodenoscopy, flexible, transoral;                        with biopsy, single or multiple Diagnosis Code(s):    --- Professional ---                       K26.9, Duodenal ulcer, unspecified as acute or chronic,                        without hemorrhage or perforation                       K31.89, Other diseases of stomach and duodenum                       K44.9, Diaphragmatic hernia without obstruction or                        gangrene                       D50.9, Iron deficiency anemia, unspecified                       R12, Heartburn                       R07.89, Other chest pain CPT copyright 2018 American Medical Association. All rights reserved. The codes documented in this report are preliminary and upon coder review may  be revised to meet current compliance requirements. Dr. Ulyess Mort Lin Landsman MD, MD 08/17/2018 10:49:52 AM This report has been signed electronically. Number of Addenda: 0 Note Initiated On: 08/17/2018 10:15 AM      [facility] [place]

## 2018-08-17 NOTE — Transfer of Care (Signed)
Immediate Anesthesia Transfer of Care Note  Patient: Donald Foster [facility]  Procedure(s) Performed: COLONOSCOPY WITH PROPOFOL (N/A ) UPPER GI ENDOSCOPY  Patient Location: PACU and Endoscopy Unit  Anesthesia Type:General  Level of Consciousness: drowsy  Airway & Oxygen Therapy: Patient Spontanous Breathing and Patient connected to nasal cannula oxygen  Post-op Assessment: Report given to RN and Post -op Vital signs reviewed and stable  Post vital signs: Reviewed and stable  Last Vitals:  Vitals Value Taken Time  BP 108/63 08/17/2018 11:24 AM  Temp 36.1 C 08/17/2018 11:23 AM  Pulse 74 08/17/2018 11:25 AM  Resp 24 08/17/2018 11:25 AM  SpO2 97 % 08/17/2018 11:25 AM  Vitals shown include unvalidated device data.  Last Pain:  Vitals:   08/17/18 1123  TempSrc: Tympanic  PainSc: Asleep         Complications: No apparent anesthesia complications

## 2018-08-17 NOTE — Anesthesia Post-op Follow-up Note (Signed)
Anesthesia QCDR form completed.        

## 2018-08-17 NOTE — H&P (Addendum)
[name], MD [address]  [place]  [place], [place] [address]  Main: [phone]  Fax: [phone] Pager: [phone]  Primary Care Physician:  Elisabeth Cara, NP Primary Gastroenterologist:  Dr. [name]  Pre-Procedure History & Physical: HPI:  Donald Foster is a 73 y.o. male is here for an endoscopy and colonoscopy.   Past Medical History:  Diagnosis Date  . GERD (gastroesophageal reflux disease)   . Hypercholesteremia   . Hypertension     Past Surgical History:  Procedure Laterality Date  . CHOLECYSTECTOMY    . ESOPHAGOGASTRODUODENOSCOPY (EGD) WITH PROPOFOL N/A 08/01/2018   Procedure: ESOPHAGOGASTRODUODENOSCOPY (EGD) WITH PROPOFOL;  Surgeon: Lin Landsman, MD;  Location: [place];  Service: Gastroenterology;  Laterality: N/A;    Prior to Admission medications   Medication Sig Start Date End Date Taking? Authorizing Provider  aluminum hydroxide-magnesium carbonate (GAVISCON) 95-358 MG/15ML SUSP Take 15 mLs by mouth 4 (four) times daily - after meals and at bedtime. 05/19/18   [name], MD  amLODipine (NORVASC) 10 MG tablet  05/03/18   [provider]  amLODipine (NORVASC) 5 MG tablet  06/04/14   [provider]  atorvastatin (LIPITOR) 20 MG tablet  05/03/18   [provider]  lisinopril (PRINIVIL,ZESTRIL) 20 MG tablet  05/11/13   [provider]  losartan (COZAAR) 50 MG tablet  05/03/18   [provider]  omeprazole (PRILOSEC) 20 MG capsule  05/03/18   [provider]  SPIRIVA HANDIHALER 18 MCG inhalation capsule  05/05/18   [provider]  tamsulosin (FLOMAX) 0.4 MG CAPS capsule Take by mouth. 09/23/14   [provider]  tamsulosin (FLOMAX) 0.4 MG CAPS capsule  05/03/18   [provider]    Allergies as of 08/03/2018  . (No Known Allergies)    History reviewed. No pertinent family history.  Social History   Socioeconomic History  . Marital  status: Single    Spouse name: Not on file  . Number of children: Not on file  . Years of education: Not on file  . Highest education level: Not on file  Occupational History  . Not on file  Social Needs  . Financial resource strain: Not on file  . Food insecurity:    Worry: Not on file    Inability: Not on file  . Transportation needs:    Medical: Not on file    Non-medical: Not on file  Tobacco Use  . Smoking status: Former Smoker    Packs/day: 0.75    Years: 57.00    Pack years: 42.75    Types: Cigarettes    Start date: 05/03/2018  . Smokeless tobacco: Never Used  Substance and Sexual Activity  . Alcohol use: Not Currently  . Drug use: Not Currently  . Sexual activity: Not on file  Lifestyle  . Physical activity:    Days per week: Not on file    Minutes per session: Not on file  . Stress: Not on file  Relationships  . Social connections:    Talks on phone: Not on file    Gets together: Not on file    Attends religious service: Not on file    Active member of club or organization: Not on file    Attends meetings of clubs or organizations: Not on file    Relationship status: Not on file  . Intimate partner violence:    Fear of current or ex partner: Not on file  Emotionally abused: Not on file    Physically abused: Not on file    Forced sexual activity: Not on file  Other Topics Concern  . Not on file  Social History Narrative  . Not on file    Review of Systems: See HPI, otherwise negative ROS  Physical Exam: BP 140/88   Pulse 99   Temp (!) 97 F (36.1 C) (Tympanic)   Resp 20   Ht 5\' 8"  (1.727 m)   Wt 65.8 kg   SpO2 96%   BMI 22.05 kg/m  General:   Alert,  pleasant and cooperative in NAD Head:  Normocephalic and atraumatic. Neck:  Supple; no masses or thyromegaly. Lungs:  Clear throughout to auscultation.    Heart:  Regular rate and rhythm. Abdomen:  Soft, nontender and nondistended. Normal bowel sounds, without guarding, and without rebound.    Neurologic:  Alert and  oriented x4;  grossly normal neurologically.  Impression/Plan: [name] is here for an endoscopy and colonoscopy to be performed for IDA, atypical chest pain, heart burn, h/o colon adenomas  Risks, benefits, limitations, and alternatives regarding  endoscopy and colonoscopy have been reviewed with the patient.  Questions have been answered.  All parties agreeable.   Sherri Sear, MD  08/17/2018, 10:05 AM

## 2018-08-17 NOTE — Op Note (Signed)
[facility] Patient Name: Donald Foster Procedure Date: 08/17/2018 10:13 AM MRN: [id number] Account #: [id number] Date of Birth: [birth year] Admit Type: Outpatient Age: 73 Room: [facility] ENDO ROOM 1 Gender: Male Note Status: Finalized Procedure:            Colonoscopy Indications:          Iron deficiency anemia Providers:            Lin Landsman MD, MD Referring MD:         Elisabeth Cara Medicines:            Monitored Anesthesia Care Complications:        No immediate complications. Estimated blood loss: None. Procedure:            Pre-Anesthesia Assessment:                       - Prior to the procedure, a History and Physical was                        performed, and patient medications and allergies were                        reviewed. The patient is competent. The risks and                        benefits of the procedure and the sedation options and                        risks were discussed with the patient. All questions                        were answered and informed consent was obtained.                        Patient identification and proposed procedure were                        verified by the physician, the nurse, the                        anesthesiologist, the anesthetist and the technician in                        the pre-procedure area in the procedure room in the                        endoscopy suite. Mental Status Examination: alert and                        oriented. Airway Examination: normal oropharyngeal                        airway and neck mobility. Respiratory Examination:                        clear to auscultation. CV Examination: normal.                        Prophylactic Antibiotics: The patient does not require  prophylactic antibiotics. Prior Anticoagulants: The                        patient has taken no previous anticoagulant or                        antiplatelet agents. ASA  Grade Assessment: II - A                        patient with mild systemic disease. After reviewing the                        risks and benefits, the patient was deemed in                        satisfactory condition to undergo the procedure. The                        anesthesia plan was to use monitored anesthesia care                        (MAC). Immediately prior to administration of                        medications, the patient was re-assessed for adequacy                        to receive sedatives. The heart rate, respiratory rate,                        oxygen saturations, blood pressure, adequacy of                        pulmonary ventilation, and response to care were                        monitored throughout the procedure. The physical status                        of the patient was re-assessed after the procedure.                       After obtaining informed consent, the colonoscope was                        passed under direct vision. Throughout the procedure,                        the patient's blood pressure, pulse, and oxygen                        saturations were monitored continuously. The                        Colonoscope was introduced through the anus and                        advanced to the the cecum, identified by appendiceal  orifice and ileocecal valve. The colonoscopy was                        performed without difficulty. The patient tolerated the                        procedure well. The quality of the bowel preparation                        was evaluated using the BBPS [facility] Bowel Preparation                        Scale) with scores of: Right Colon = 3, Transverse                        Colon = 3 and Left Colon = 3 (entire mucosa seen well                        with no residual staining, small fragments of stool or                        opaque liquid). The total BBPS score equals 9. Findings:      The digital  rectal exam revealed a 2 cm (diameter) soft and mobile anal       mass palpated at the anal verge. The mass was non-circumferential.      Hemorrhoids were found on perianal exam.      Ten sessile polyps were found in the sigmoid colon, transverse colon and       ascending colon. The polyps were 6 to 9 mm in size. These polyps were       removed with a hot snare. Resection and retrieval were complete.      Non-bleeding external hemorrhoids were found during retroflexion and       during perianal exam. The hemorrhoids were large.      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 There was no       evidence of diverticular bleeding. Impression:           - Anal mass at the anal verge from the anal verge.                       - Hemorrhoids found on perianal exam.                       - Ten 6 to 9 mm polyps in the sigmoid colon, in the                        transverse colon and in the ascending colon, removed                        with a hot snare. Resected and retrieved.                       - Non-bleeding external hemorrhoids. Recommendation:       - Discharge patient to home (with escort).                       - Resume regular diet today.                       -  Continue present medications.                       - Await pathology results.                       - Repeat colonoscopy in 1 year for surveillance of                        multiple polyps.                       - Refer to a surgeon at appointment to be scheduled for                        excision of anal lesion                       - Return to my office as previously scheduled. Procedure Code(s):    --- Professional ---                       [phone], Colonoscopy, flexible; with removal of tumor(s),                        polyp(s), or other lesion(s) by snare technique Diagnosis Code(s):    --- Professional ---                       K62.89, Other specified diseases of anus and rectum                       K64.4,  Residual hemorrhoidal skin tags                       D12.5, Benign neoplasm of sigmoid colon                       D12.3, Benign neoplasm of transverse colon (hepatic                        flexure or splenic flexure)                       D12.2, Benign neoplasm of ascending colon                       D50.9, Iron deficiency anemia, unspecified CPT copyright 2018 American Medical Association. All rights reserved. The codes documented in this report are preliminary and upon coder review may  be revised to meet current compliance requirements. Dr. Ulyess Mort Lin Landsman MD, MD 08/17/2018 11:25:36 AM This report has been signed electronically. Number of Addenda: 0 Note Initiated On: 08/17/2018 10:13 AM Scope Withdrawal Time: 0 hours 23 minutes 31 seconds  Total Procedure Duration: 0 hours 27 minutes 21 seconds       [facility]

## 2018-08-18 ENCOUNTER — Encounter: Payer: Self-pay | Admitting: Gastroenterology

## 2018-08-18 NOTE — Anesthesia Postprocedure Evaluation (Signed)
Anesthesia Post Note  Patient: Donald Foster [facility]  Procedure(s) Performed: COLONOSCOPY WITH PROPOFOL (N/A ) UPPER GI ENDOSCOPY  Anesthesia Type: General     Last Vitals:  Vitals:   08/17/18 1133 08/17/18 1143  BP: 127/76 101/77  Pulse: 71 70  Resp: 20 13  Temp:    SpO2: 98% 97%    Last Pain:  Vitals:   08/18/18 0745  TempSrc:   PainSc: 0-No pain                 Molli Barrows

## 2018-08-22 LAB — SURGICAL PATHOLOGY

## 2018-08-28 ENCOUNTER — Encounter: Payer: Self-pay | Admitting: *Deleted

## 2018-08-28 ENCOUNTER — Telehealth: Payer: Self-pay | Admitting: *Deleted

## 2018-08-28 ENCOUNTER — Encounter: Payer: Self-pay | Admitting: Surgery

## 2018-08-28 ENCOUNTER — Ambulatory Visit (INDEPENDENT_AMBULATORY_CARE_PROVIDER_SITE_OTHER): Payer: Medicare HMO | Admitting: Surgery

## 2018-08-28 ENCOUNTER — Encounter: Payer: Self-pay | Admitting: Gastroenterology

## 2018-08-28 ENCOUNTER — Other Ambulatory Visit: Payer: Self-pay

## 2018-08-28 VITALS — BP 145/72 | HR 82 | Temp 97.7°F | Ht 68.0 in | Wt 147.2 lb

## 2018-08-28 DIAGNOSIS — K6289 Other specified diseases of anus and rectum: Secondary | ICD-10-CM

## 2018-08-28 NOTE — Telephone Encounter (Signed)
Patient's sister, Hassan Rowan, called the office back and was notified of Pre-admission appointment scheduled for 09-04-18 at 8 am. She verbalizes understanding.

## 2018-08-28 NOTE — Telephone Encounter (Signed)
I did call and try to speak with patient's sister, Hassan Rowan who was unavailable.   I did speak with the patient's other sister who asked that I call back in 30 minutes.   We need to inform patient or sister, Hassan Rowan of patient's Pre-admission Testing appointment that has been scheduled for 09-04-18 at 8 am. Patient states his phone is out of service at present and gave [place] verbal permission to speak with sister, Hassan Rowan about this appointment.

## 2018-08-28 NOTE — Progress Notes (Signed)
Patient's surgery to be scheduled for 09-07-18 at [facility] with Dr. Dahlia Byes.  The patient is aware he will need to Pre-Admit. Patient will check in at the [place], [address] (first floor). Patient to be contacted once date/time arranged.  The patient is aware to call the office should he have further questions.

## 2018-08-28 NOTE — Patient Instructions (Addendum)
Patient is to scheduled for a EUA with Dr.Pabon.  Call the office with any questions or concerns.

## 2018-08-28 NOTE — Progress Notes (Signed)
Patient ID: Donald Foster, male   DOB: 12-24-1945, 73 y.o.   MRN: [id number]  HPI Donald Foster is a 73 y.o. male consultation at the request of Dr. Marius Ditch.  Case discussed with her in detail.  Donald Foster 32-year-old male with a recent colonoscopy showing evidence of a rectal mass protruding through the anus.  No biopsies were obtained.  Please note that I have personally reviewed the images for the colonoscopy.  He reports some intermittent mild hematochezia especially after having a bowel movement.  No fevers no chills no weight loss.  No family history of colon cancer. Polyps c/w TA.  He is able to perform more than 4 METS of activity without any shortness of breath or chest pain.  BC and CMP were unremarkable except some increase in the creatinine up to 1.6  HPI  Past Medical History:  Diagnosis Date  . GERD (gastroesophageal reflux disease)   . Hypercholesteremia   . Hypertension     Past Surgical History:  Procedure Laterality Date  . CHOLECYSTECTOMY    . COLONOSCOPY WITH PROPOFOL N/A 08/17/2018   Procedure: COLONOSCOPY WITH PROPOFOL;  Surgeon: Lin Landsman, MD;  Location: [facility] ENDOSCOPY;  Service: Gastroenterology;  Laterality: N/A;  . ESOPHAGOGASTRODUODENOSCOPY (EGD) WITH PROPOFOL N/A 08/01/2018   Procedure: ESOPHAGOGASTRODUODENOSCOPY (EGD) WITH PROPOFOL;  Surgeon: Lin Landsman, MD;  Location: [facility] ENDOSCOPY;  Service: Gastroenterology;  Laterality: N/A;  . UPPER GI ENDOSCOPY  08/17/2018   Procedure: UPPER GI ENDOSCOPY;  Surgeon: Lin Landsman, MD;  Location: [facility] ENDOSCOPY;  Service: Gastroenterology;;    Family History  Problem Relation Age of Onset  . Cancer Father     Social History Social History   Tobacco Use  . Smoking status: Former Smoker    Packs/day: 0.75    Years: 57.00    Pack years: 42.75    Types: Cigarettes    Start date: 05/03/2018  . Smokeless tobacco: Never Used  Substance Use Topics  . Alcohol use: Not Currently  . Drug use: Not  Currently    No Known Allergies  Current Outpatient Medications  Medication Sig Dispense Refill  . aluminum hydroxide-magnesium carbonate (GAVISCON) 95-358 MG/15ML SUSP Take 15 mLs by mouth 4 (four) times daily - after meals and at bedtime. 1 Bottle 2  . amLODipine (NORVASC) 10 MG tablet     . amLODipine (NORVASC) 5 MG tablet     . atorvastatin (LIPITOR) 20 MG tablet     . lisinopril (PRINIVIL,ZESTRIL) 20 MG tablet     . losartan (COZAAR) 50 MG tablet     . omeprazole (PRILOSEC) 40 MG capsule Take 1 capsule (40 mg total) by mouth 2 (two) times daily before a meal. 180 capsule 0  . SPIRIVA HANDIHALER 18 MCG inhalation capsule     . tamsulosin (FLOMAX) 0.4 MG CAPS capsule Take by mouth.    . tamsulosin (FLOMAX) 0.4 MG CAPS capsule      No current facility-administered medications for this visit.      Review of Systems Full ROS  was asked and was negative except for the information on the HPI  Physical Exam Blood pressure (!) 145/72, pulse 82, temperature 97.7 F (36.5 C), temperature source Temporal, height 5\' 8"  (1.727 m), weight 147 lb 3.2 oz (66.8 kg), SpO2 93 %. CONSTITUTIONAL: NAD EYES: Pupils are equal, round, and reactive to light, Sclera are non-icteric. EARS, NOSE, MOUTH AND THROAT: The oropharynx is clear. The oral mucosa is pink and moist. Hearing is intact  to voice. LYMPH NODES:  Lymph nodes in the neck are normal. RESPIRATORY:  Lungs are clear. There is normal respiratory effort, with equal breath sounds bilaterally, and without pathologic use of accessory muscles. CARDIOVASCULAR: Heart is regular without murmurs, gallops, or rubs. GI: The abdomen is soft, nontender, and nondistended. There are no palpable masses. There is no hepatosplenomegaly. There are normal bowel sounds in all quadrants.  Rectal There is a rectal lesion extending into the anus on the right anterolateral aspect. Mucosa without ulceration. Likely hemorrhoid but can not exclude  malignancy MUSCULOSKELETAL: Normal muscle strength and tone. No cyanosis or edema.   SKIN: Turgor is good and there are no pathologic skin lesions or ulcers. NEUROLOGIC: Motor and sensation is grossly normal. Cranial nerves are grossly intact. PSYCH:  Oriented to person, place and time. Affect is normal.  Data Reviewed  I have personally reviewed the patient's imaging, laboratory findings and medical records.    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  Given the uncertainty of this lesion I do recommend exam under anesthesia and biopsy.  I do Think that the first order of business is to obtain a tissue diagnosis since there is a low but real probability that this may be cancer.  I have discussed with the patient in detail and have also discussed the case with Dr. Marius Ditch.  We will plan for exam under anesthesia and biopsy  .     Caroleen Hamman, MD FACS General Surgeon 08/28/2018, 1:19 PM

## 2018-08-29 ENCOUNTER — Other Ambulatory Visit: Payer: Self-pay

## 2018-08-29 DIAGNOSIS — K635 Polyp of colon: Secondary | ICD-10-CM

## 2018-09-04 ENCOUNTER — Encounter
Admission: RE | Admit: 2018-09-04 | Discharge: 2018-09-04 | Disposition: A | Discharge status: Home / Self Care | Payer: Medicare HMO | Source: Ambulatory Visit | Attending: Surgery | Admitting: Surgery

## 2018-09-04 ENCOUNTER — Other Ambulatory Visit: Payer: Self-pay

## 2018-09-04 DIAGNOSIS — Z01812 Encounter for preprocedural laboratory examination: Secondary | ICD-10-CM

## 2018-09-04 DIAGNOSIS — Z538 Procedure and treatment not carried out for other reasons: Secondary | ICD-10-CM | POA: Diagnosis not present

## 2018-09-04 HISTORY — DX: Chronic obstructive pulmonary disease, unspecified: J44.9

## 2018-09-04 NOTE — Patient Instructions (Signed)
Your procedure is scheduled on: Thursday 09/07/2018 Report to [place]. To find out your arrival time please call [phone] between 1PM - 3PM on Wednesday 09/06/2018.  Remember: Instructions that are not followed completely may result in serious medical risk, up to and including death, or upon the discretion of your surgeon and anesthesiologist your surgery may need to be rescheduled.     _X__ 1. Do not eat food after midnight the night before your procedure.                 No gum chewing or hard candies. You may drink clear liquids up to 2 hours                 before you are scheduled to arrive for your surgery- DO not drink clear                 liquids within 2 hours of the start of your surgery.                 Clear Liquids include:  water, apple juice without pulp, clear carbohydrate                 drink such as Clearfast or Gatorade, Black Coffee or Tea (Do not add                 anything to coffee or tea).  __X__2.  On the morning of surgery brush your teeth with toothpaste and water, you                 may rinse your mouth with mouthwash if you wish.  Do not swallow any              toothpaste of mouthwash.     _X__ 3.  No Alcohol for 24 hours before or after surgery.   _X__ 4.  Do Not Smoke or use e-cigarettes For 24 Hours Prior to Your Surgery.                 Do not use any chewable tobacco products for at least 6 hours prior to                 surgery.  ____  5.  Bring all medications with you on the day of surgery if instructed.   __X__  6.  Notify your doctor if there is any change in your medical condition      (cold, fever, infections).     Do not wear jewelry, make-up, hairpins, clips or nail polish. Do not wear lotions, powders, or perfumes.  Do not shave 48 hours prior to surgery. Men may shave face and neck. Do not bring valuables to the hospital.    [facility] is not responsible for any belongings  or valuables.  Contacts, dentures/partials or body piercings may not be worn into surgery. Bring a case for your contacts, glasses or hearing aids, a denture cup will be supplied. Leave your suitcase in the car. After surgery it may be brought to your room. For patients admitted to the hospital, discharge time is determined by your treatment team.   Patients discharged the day of surgery will not be allowed to drive home.   Please read over the following fact sheets that you were given:   MRSA Information  __X__ Take these medicines the morning of surgery with A SIP OF WATER:  1. amLODipine (NORVASC)  2. omeprazole (PRILOSEC)  3. tamsulosin (FLOMAX)  4.  5.  6.  ____ Fleet Enema (as directed)   __X__ Use CHG Soap/SAGE wipes as directed  __X__ Use inhalers on the day of surgery   USE YOUR SPIRIVA INHALER BEFORE YOU ARRIVE FOR YOUR PROCEDURE  ____ Stop metformin/Janumet/Farxiga 2 days prior to surgery    ____ Take 1/2 of usual insulin dose the night before surgery. No insulin the morning          of surgery.   ____ Stop Blood Thinners Coumadin/Plavix/Xarelto/Pleta/Pradaxa/Eliquis/Effient/Aspirin  on   Or contact your Surgeon, Cardiologist or Medical Doctor regarding  ability to stop your blood thinners  __X__ Stop Anti-inflammatories 7 days before surgery such as Advil, Ibuprofen, Motrin,  BC or Goodies Powder, Naprosyn, Naproxen, Aleve, Aspirin   TYLENOL OK  __X__ Stop all herbal supplements, fish oil or vitamin E until after surgery.    ____ Bring C-Pap to the hospital.

## 2018-09-07 ENCOUNTER — Other Ambulatory Visit: Payer: Self-pay

## 2018-09-07 ENCOUNTER — Ambulatory Visit
Admission: RE | Admit: 2018-09-07 | Discharge: 2018-09-07 | Disposition: A | Discharge status: Home / Self Care | Payer: Medicare HMO | Source: Ambulatory Visit | Attending: Surgery | Admitting: Surgery

## 2018-09-07 ENCOUNTER — Encounter
Admission: RE | Disposition: A | Discharge status: Home / Self Care | Payer: Self-pay | Source: Ambulatory Visit | Attending: Surgery

## 2018-09-07 DIAGNOSIS — K6289 Other specified diseases of anus and rectum: Secondary | ICD-10-CM

## 2018-09-07 DIAGNOSIS — Z538 Procedure and treatment not carried out for other reasons: Secondary | ICD-10-CM | POA: Insufficient documentation

## 2018-09-07 SURGERY — EXAM UNDER ANESTHESIA
Anesthesia: Choice

## 2018-09-07 MED ORDER — ONDANSETRON HCL 4 MG/2ML IJ SOLN
INTRAMUSCULAR | Status: AC
  Filled 2018-09-07: qty 2

## 2018-09-07 MED ORDER — CHLORHEXIDINE GLUCONATE CLOTH 2 % EX PADS: 6.0000 | MEDICATED_PAD | Freq: Once | CUTANEOUS | Status: DC

## 2018-09-07 MED ORDER — LACTATED RINGERS IV SOLN
INTRAVENOUS | Status: DC
  Administered 2018-09-07: 12:00:00 via INTRAVENOUS

## 2018-09-07 MED ORDER — MIDAZOLAM HCL 2 MG/2ML IJ SOLN
INTRAMUSCULAR | Status: AC
  Filled 2018-09-07: qty 2

## 2018-09-07 MED ORDER — PROPOFOL 10 MG/ML IV BOLUS
INTRAVENOUS | Status: AC
  Filled 2018-09-07: qty 20

## 2018-09-07 MED ORDER — FENTANYL CITRATE (PF) 100 MCG/2ML IJ SOLN
INTRAMUSCULAR | Status: AC
  Filled 2018-09-07: qty 2

## 2018-09-07 MED ORDER — LIDOCAINE HCL (PF) 2 % IJ SOLN
INTRAMUSCULAR | Status: AC
  Filled 2018-09-07: qty 10

## 2018-09-07 MED ORDER — SUGAMMADEX SODIUM 200 MG/2ML IV SOLN
INTRAVENOUS | Status: AC
  Filled 2018-09-07: qty 2

## 2018-09-07 SURGICAL SUPPLY — 15 items
CANISTER SUCT 3000ML PPV (MISCELLANEOUS) ×3 IMPLANT
COVER WAND RF STERILE (DRAPES) ×3 IMPLANT
DRAPE LEGGINS SURG 28X43 STRL (DRAPES) ×3 IMPLANT
DRAPE SHEET LG 3/4 BI-LAMINATE (DRAPES) ×3 IMPLANT
DRAPE UNDER BUTTOCK W/FLU (DRAPES) ×3 IMPLANT
ELECT REM PT RETURN 9FT ADLT (ELECTROSURGICAL) ×3
ELECTRODE REM PT RTRN 9FT ADLT (ELECTROSURGICAL) ×1 IMPLANT
GLOVE BIO SURGEON STRL SZ7 (GLOVE) ×3 IMPLANT
GOWN STRL REUS W/ TWL LRG LVL3 (GOWN DISPOSABLE) ×2 IMPLANT
GOWN STRL REUS W/TWL LRG LVL3 (GOWN DISPOSABLE) ×4
NS IRRIG 1000ML POUR BTL (IV SOLUTION) ×3 IMPLANT
PACK BASIN MINOR ARMC (MISCELLANEOUS) ×3 IMPLANT
SOL PREP PVP 2OZ (MISCELLANEOUS) ×3
SOLUTION PREP PVP 2OZ (MISCELLANEOUS) ×1 IMPLANT
SURGILUBE 2OZ TUBE FLIPTOP (MISCELLANEOUS) ×3 IMPLANT

## 2018-09-07 NOTE — OR Nursing (Signed)
Patient opted to cancel surgery and reschedule. Didn't have a ride home after 5pm and surgery was delayed. Was instructed to call Dr Corlis Leak office to reschedule.

## 2018-09-07 NOTE — Anesthesia Preprocedure Evaluation (Deleted)
Anesthesia Evaluation  Patient identified by MRN, date of birth, ID band Patient awake    Reviewed: Allergy & Precautions, H&P , NPO status , Patient's Chart, lab work & pertinent test results  Airway Mallampati: III       Dental  (+) Upper Dentures, Lower Dentures   Pulmonary COPD,  COPD inhaler, neg recent URI, former smoker,           Cardiovascular hypertension, (-) angina(-) Past MI      Neuro/Psych negative neurological ROS  negative psych ROS   GI/Hepatic Neg liver ROS, PUD, GERD  Controlled,Anal mass   Endo/Other  negative endocrine ROS  Renal/GU negative Renal ROS  negative genitourinary   Musculoskeletal   Abdominal   Peds  Hematology  (+) Blood dyscrasia, anemia ,   Anesthesia Other Findings Past Medical History: No date: COPD (chronic obstructive pulmonary disease) (HCC) No date: GERD (gastroesophageal reflux disease) No date: Hypercholesteremia No date: Hypertension  Past Surgical History: No date: CHOLECYSTECTOMY 08/17/2018: COLONOSCOPY WITH PROPOFOL; N/A     Comment:  Procedure: COLONOSCOPY WITH PROPOFOL;  Surgeon: Lin Landsman, MD;  Location: [facility] ENDOSCOPY;  Service:               Gastroenterology;  Laterality: N/A; 08/01/2018: ESOPHAGOGASTRODUODENOSCOPY (EGD) WITH PROPOFOL; N/A     Comment:  Procedure: ESOPHAGOGASTRODUODENOSCOPY (EGD) WITH               PROPOFOL;  Surgeon: Lin Landsman, MD;  Location:               [facility] ENDOSCOPY;  Service: Gastroenterology;  Laterality:               N/A; 08/17/2018: UPPER GI ENDOSCOPY     Comment:  Procedure: UPPER GI ENDOSCOPY;  Surgeon: Lin Landsman, MD;  Location: [facility] ENDOSCOPY;  Service:               Gastroenterology;;     Reproductive/Obstetrics negative OB ROS                            Anesthesia Physical Anesthesia Plan  ASA: III  Anesthesia Plan: General   Post-op  Pain Management:    Induction:   PONV Risk Score and Plan:   Airway Management Planned:   Additional Equipment:   Intra-op Plan:   Post-operative Plan:   Informed Consent: I have reviewed the patients History and Physical, chart, labs and discussed the procedure including the risks, benefits and alternatives for the proposed anesthesia with the patient or authorized representative who has indicated his/her understanding and acceptance.     Dental Advisory Given  Plan Discussed with: Anesthesiologist and CRNA  Anesthesia Plan Comments:        Anesthesia Quick Evaluation

## 2018-09-08 ENCOUNTER — Telehealth: Payer: Self-pay

## 2018-09-08 NOTE — Telephone Encounter (Signed)
Patient called and had his surgery scheduled for yesterday(09/07/18) canceled due to the OR running over. He would like to go ahead and get this rescheduled for a morning start time. The patient is rescheduled for surgery at [facility] with Dr Dahlia Byes on 09/28/18. He is aware to call the day before to get his arrival time.

## 2018-09-19 ENCOUNTER — Encounter: Payer: Self-pay | Admitting: Gastroenterology

## 2018-09-19 ENCOUNTER — Ambulatory Visit (INDEPENDENT_AMBULATORY_CARE_PROVIDER_SITE_OTHER): Payer: Medicare HMO | Admitting: Gastroenterology

## 2018-09-19 ENCOUNTER — Other Ambulatory Visit: Payer: Self-pay

## 2018-09-19 VITALS — BP 146/88 | HR 92 | Resp 17 | Ht 68.0 in | Wt 147.6 lb

## 2018-09-19 DIAGNOSIS — K629 Disease of anus and rectum, unspecified: Secondary | ICD-10-CM

## 2018-09-19 DIAGNOSIS — K269 Duodenal ulcer, unspecified as acute or chronic, without hemorrhage or perforation: Secondary | ICD-10-CM | POA: Diagnosis not present

## 2018-09-19 DIAGNOSIS — D500 Iron deficiency anemia secondary to blood loss (chronic): Secondary | ICD-10-CM | POA: Diagnosis not present

## 2018-09-19 NOTE — Progress Notes (Signed)
[name], MD [address]  [place]  [place],  [address]  Main: [phone]  Fax: [phone]    Gastroenterology Consultation  Referring Provider:     Elisabeth Cara, NP Primary Care Physician:  Elisabeth Cara, NP Primary Gastroenterologist:  Dr. [name] Reason for Consultation:    Peptic ulcer disease, iron deficiency       HPI:   BRANDLEY ALDRETE is a 73 y.o. male referred by Dr. Elisabeth Cara, NP  for consultation & management of atypical chest pain and heartburn.  Patient was recently seen in the ER in 05/2018 secondary to chest pain.  EKG, troponins were negative.  Therefore, he was referred to GI for further evaluation.  He was given Gaviscon in the ER. Patient is currently taking omeprazole 20 mg daily.  He also reports heartburn and regurgitation which is relieved by taking PPI daily.  He denies dysphagia, weight loss.  He denies any other GI symptoms  Follow-up visit 09/19/2018 Patient underwent EGD and colonoscopy, found to have clean-based duodenal ulcer.  He is also found to have several polyps in the colon which were tubular adenomas and an anal lesion, currently being evaluated by Dr. Perrin Maltese.  He is scheduled for exam under anesthesia on 09/28/2018.  He had severe iron deficiency, started him on oral iron.  He is currently taking omeprazole 40 mg 2 pills in the morning although the prescription said to take 1 pill before each meal.  He denies any GI symptoms today.  He did not receive a call from genetic counselor yet.  NSAIDs: None  Antiplts/Anticoagulants/Anti thrombotics: None  GI Procedures:  Reports having stool card test annually and has been negative to date  Colonoscopy in 08/1990 at [facility] The procedure impressions are; 6 polyps, 1 mm in diameter to 10 mm in diameter, from the transverse colon, the splenic flexure, and the sigmoid colon. DIAGNOSIS COLON, PROXIMAL TRANSVERSE, BIOPSY - ADENOMATOUS POLYP COLON, SPLENIC FLEXURE,  BIOPSY - ADENOMATOUS POLYPS COLON, SIGMOID, BIOPSY - ADENOMATOUS POLYPS  Colonoscopy 09/1991 The patient is a 73 year old black male who undergoes colonoscopy. Snare polypectomy of a 4x4 mm polyp at the hepatic flexure is performed.  DIAGNOSIS COLON, ENDOSCOPIC BIOPSY - ADENOMATOUS POLYP, 4 x 4 millimeter  EGD and colonoscopy 08/17/2018 - One non-bleeding duodenal ulcer with a clean ulcer base (Forrest Class III), likely source of iron deficiency anemia. Biopsied. - Normal duodenal bulb. - Erythematous mucosa in the gastric body. Biopsied. - Normal antrum. Biopsied. - 1 cm hiatal hernia. - Normal gastroesophageal junction and esophagus.   - Anal mass at the anal verge from the anal verge. - Hemorrhoids found on perianal exam. - Ten 6 to 9 mm polyps in the sigmoid colon, in the transverse colon and in the ascending colon, removed with a hot snare. Resected and retrieved. - Non-bleeding external hemorrhoids.  DIAGNOSIS:  A. DUODENUM; COLD BIOPSY:  - MILD REACTIVE DUODENITIS AND CHANGES CONSISTENT WITH HEALING MUCOSAL  INJURY.  - NEGATIVE FOR VIRAL CYTOPATHIC EFFECT, DYSPLASIA, AND MALIGNANCY.   B. STOMACH; COLD BIOPSY:  - ANTRAL AND OXYNTIC MUCOSA WITH PATCHY MODERATE CHRONIC GASTRITIS.  - CHANGES CONSISTENT WITH PROTON PUMP INHIBITOR USE.  - NEGATIVE FOR DYSPLASIA AND MALIGNANCY.  - IHC [place]. PYLORI WILL BE RESULTED IN AN ADDENDUM.   C. COLON POLYP X4, ASCENDING; HOT SNARE:  -TUBULAR ADENOMA (MULTIPLE FRAGMENTS).  - NEGATIVE FOR HIGH-GRADE DYSPLASIA AND MALIGNANCY.   D. COLON POLYP X5, TRANSVERSE; HOT SNARE:  - TUBULAR ADENOMA (MULTIPLE  FRAGMENTS).  - NEGATIVE FOR HIGH-GRADE DYSPLASIA AND MALIGNANCY.   E. COLON POLYP, SIGMOID; HOT SNARE:  - TUBULAR ADENOMA.  - NEGATIVE FOR HIGH-GRADE DYSPLASIA AND MALIGNANCY.    Past Medical History:  Diagnosis Date  . COPD (chronic obstructive pulmonary disease) ([place])   . GERD (gastroesophageal reflux disease)   .  Hypercholesteremia   . Hypertension     Past Surgical History:  Procedure Laterality Date  . CHOLECYSTECTOMY    . COLONOSCOPY WITH PROPOFOL N/A 08/17/2018   Procedure: COLONOSCOPY WITH PROPOFOL;  Surgeon: Lin Landsman, MD;  Location: [facility] ENDOSCOPY;  Service: Gastroenterology;  Laterality: N/A;  . ESOPHAGOGASTRODUODENOSCOPY (EGD) WITH PROPOFOL N/A 08/01/2018   Procedure: ESOPHAGOGASTRODUODENOSCOPY (EGD) WITH PROPOFOL;  Surgeon: Lin Landsman, MD;  [facility] END[facility]COPY;  Service: Gastroenterology;  Laterality: N/A;  . UPPER GI ENDOSCOPY  08/17/2018   Procedure: UPPER GI ENDOSCOPY;  Surgeon: Lin Landsman, MD;  Location: [facility] ENDOSCOPY;  Service: Gastroenterology;;    Current Outpatient Medications:  .  aluminum hydroxide-magnesium carbonate (GAVISCON) 95-358 MG/15ML SUSP, Take 15 mLs by mouth 4 (four) times daily - after meals and at bedtime., Disp: 1 Bottle, Rfl: 2 .  amLODipine (NORVASC) 10 MG tablet, Take by mouth daily. , Disp: , Rfl:  .  atorvastatin (LIPITOR) 20 MG tablet, Take 20 mg by mouth at bedtime. , Disp: , Rfl:  .  ferrous sulfate 325 (65 FE) MG tablet, Take 325 mg by mouth daily with breakfast., Disp: , Rfl:  .  lisinopril (PRINIVIL,ZESTRIL) 20 MG tablet, , Disp: , Rfl:  .  losartan (COZAAR) 50 MG tablet, Take 50 mg by mouth daily. , Disp: , Rfl:  .  omeprazole (PRILOSEC) 40 MG capsule, Take 1 capsule (40 mg total) by mouth 2 (two) times daily before a meal., Disp: 180 capsule, Rfl: 0 .  SPIRIVA HANDIHALER 18 MCG inhalation capsule, , Disp: , Rfl:  .  tamsulosin (FLOMAX) 0.4 MG CAPS capsule, Take 0.4 mg by mouth daily. , Disp: , Rfl:  .  amLODipine (NORVASC) 5 MG tablet, , Disp: , Rfl:   Family History  Problem Relation Age of Onset  . Cancer Father      Social History   Tobacco Use  . Smoking status: Former Smoker    Packs/day: 0.75    Years: 57.00    Pack years: 42.75    Types: Cigarettes    Start date: 05/03/2018  . Smokeless tobacco: Never  Used  Substance Use Topics  . Alcohol use: Not Currently  . Drug use: Not Currently    Allergies as of 09/19/2018  . (No Known Allergies)    Review of Systems:    All systems reviewed and negative except where noted in HPI.   Physical Exam:  BP (!) 146/88 (BP Location: Left Arm, Patient Position: Sitting, Cuff Size: Normal)   Pulse 92   Resp 17   Ht 5\' 8"  (1.727 m)   Wt 147 lb 9.6 oz (67 kg)   BMI 22.44 kg/m  No LMP for male patient.  General:   Alert,  Well-developed, well-nourished, pleasant and cooperative in NAD Head:  Normocephalic and atraumatic. Eyes:  Sclera clear, no icterus.   Conjunctiva pink. Ears:  Normal auditory acuity. Nose:  No deformity, discharge, or lesions. Mouth:  No deformity or lesions,oropharynx pink & moist. Neck:  Supple; no masses or thyromegaly. Lungs:  Respirations even and unlabored.  Clear throughout to auscultation.   No wheezes, crackles, or rhonchi. No acute distress. Heart:  Regular rate and rhythm; no murmurs, clicks, rubs, or gallops. Abdomen:  Normal bowel sounds. Soft, non-tender and non-distended without masses, hepatosplenomegaly or hernias noted.  No guarding or rebound tenderness.   Rectal: Not performed Msk:  Symmetrical without gross deformities. Good, equal movement & strength bilaterally. Pulses:  Normal pulses noted. Extremities:  No clubbing or edema.  No cyanosis. Neurologic:  Alert and oriented x3;  grossly normal neurologically. Skin:  Intact without significant lesions or rashes. No jaundice. Psych:  Alert and cooperative. Normal mood and affect.  Imaging Studies: No abdominal imaging  Assessment and Plan:   GARNER DULLEA is a 73 y.o. African-American male with history of hypertension, BPH, adenomatous polyps of the colon here for follow-up of iron deficiency, peptic ulcer disease, atypical chest pain.  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 pylori infection He does not have anemia at present and has normal B12 and folate levels Recheck labs in 3 months  History of adenomatous polyps of the colon  Surveillance colonoscopy in 08/2018 revealed 10 adenomatous polyps without evidence of high-grade dysplasia.  Recommend surveillance colonoscopy in 08/2019.  He is also referred to genetic counselor at [place] [facility] He is scheduled for exam under anesthesia on 09/28/2018   Follow up in 3 months   [name], MD

## 2018-09-27 ENCOUNTER — Telehealth: Payer: Self-pay | Admitting: *Deleted

## 2018-09-27 NOTE — Telephone Encounter (Signed)
Patient's sister, Hassan Rowan, called the office back and was notified per previous note.   They would like to get surgery rescheduled to 11-02-18 at [facility].

## 2018-09-27 NOTE — Telephone Encounter (Signed)
Message left for the patient and for his sister, Hassan Rowan, to have patient call the office.   Patient was scheduled for elective surgery on 09-28-18. Due to COVID-19 patient will need to be rescheduled for at least 4 weeks out per Dr. Dahlia Byes. Patient needs to be informed that the reschedule date will be a tentative date based upon daily recommendations that we are receiving daily.

## 2018-09-28 ENCOUNTER — Encounter: Admission: RE | Payer: Self-pay | Source: Home / Self Care

## 2018-09-28 ENCOUNTER — Ambulatory Visit: Admission: RE | Admit: 2018-09-28 | Payer: Medicare HMO | Source: Home / Self Care | Admitting: Surgery

## 2018-09-28 SURGERY — EXAM UNDER ANESTHESIA
Anesthesia: Choice

## 2018-10-23 ENCOUNTER — Telehealth: Payer: Self-pay | Admitting: *Deleted

## 2018-10-23 NOTE — Telephone Encounter (Signed)
Message left for patient to call the office.   Patient's surgery that was scheduled for 11-02-18 with Dr. Dahlia Byes needs to be cancelled at this time due to COVID-19 restrictions.   The patient will be contacted once restrictions have been lifted in regards to rescheduling.

## 2018-10-23 NOTE — Telephone Encounter (Signed)
Hassan Rowan called the office back in regards to this patient today.   She is aware per previous message and verbalizes understanding.

## 2018-11-02 ENCOUNTER — Ambulatory Visit: Admit: 2018-11-02 | Payer: Medicare HMO | Admitting: Surgery

## 2018-11-02 SURGERY — EXAM UNDER ANESTHESIA
Anesthesia: Choice

## 2018-11-16 ENCOUNTER — Telehealth: Payer: Self-pay | Admitting: *Deleted

## 2018-11-16 NOTE — Telephone Encounter (Signed)
Spoke with patient's sister, Hassan Rowan today.   I called wanting to see if patient would be willing to have surgery done in [place] due to COVID-19 restrictions.   She will talk with the patient and call the office back with a decision.

## 2018-12-19 ENCOUNTER — Ambulatory Visit: Payer: Medicare HMO | Admitting: Gastroenterology

## 2019-01-01 NOTE — Telephone Encounter (Signed)
Message left for patient's sister, Hassan Rowan, to call the office.   We need to get patient's surgery with Dr. Dahlia Byes rescheduled. This was previously postponed due to COVID-19.

## 2019-01-17 ENCOUNTER — Telehealth: Payer: Self-pay | Admitting: *Deleted

## 2019-01-17 NOTE — Telephone Encounter (Signed)
Spoke with the patient's sister, Hassan Rowan,  today since we have not heard back from the patient. We would like to see if he would like to get his surgery rescheduled with Dr. Dahlia Byes which was previously postponed due to [place].   She will get in touch with the patient and have him call our office back.

## 2019-01-19 NOTE — Telephone Encounter (Signed)
Patient's sister Darryll Capers called and would like to reschedule patient's surgery with Dr Dicie Beam. Please call sister Hassan Rowan at [phone].

## 2019-01-23 NOTE — Telephone Encounter (Signed)
Message left for patient's sister, Hassan Rowan to call the office.

## 2019-01-25 NOTE — Telephone Encounter (Signed)
I did call and was able to speak with patient's sister, Hassan Rowan today.   Sister states patient does want to get surgery rescheduled.   A pre-op visit was scheduled for 01-31-19 at 9:45 am with Dr. Dahlia Byes.   Per sister, she does not want to schedule a date for surgery for patient and he was not with her at the time of the call for me to speak to directly. Patient to discuss a surgery date when he comes in the office to see Dr. Dahlia Byes next week.

## 2019-01-31 ENCOUNTER — Ambulatory Visit: Payer: Medicaid Other | Admitting: Surgery

## 2019-02-05 ENCOUNTER — Ambulatory Visit (INDEPENDENT_AMBULATORY_CARE_PROVIDER_SITE_OTHER): Payer: Medicare HMO | Admitting: Surgery

## 2019-02-05 ENCOUNTER — Encounter: Payer: Self-pay | Admitting: Surgery

## 2019-02-05 ENCOUNTER — Other Ambulatory Visit: Payer: Self-pay

## 2019-02-05 VITALS — BP 138/77 | HR 84 | Temp 98.1°F | Ht 67.0 in | Wt 141.0 lb

## 2019-02-05 DIAGNOSIS — K6289 Other specified diseases of anus and rectum: Secondary | ICD-10-CM | POA: Diagnosis not present

## 2019-02-05 NOTE — Patient Instructions (Signed)
Surgical Procedures for Hemorrhoids, Care After This sheet gives you information about how to care for yourself after your procedure. Your health care provider may also give you more specific instructions. If you have problems or questions, contact your health care provider. What can I expect after the procedure? After the procedure, it is common to have:  Rectal pain.  Pain when you are having a bowel movement.  Slight rectal bleeding. This is more likely to happen with the first bowel movement after surgery. Follow these instructions at home: Medicines  Take over-the-counter and prescription medicines only as told by your health care provider.  If you were prescribed an antibiotic medicine, use it as told by your health care provider. Do not stop using the antibiotic even if your condition improves.  Ask your health care provider if the medicine prescribed to you requires you to avoid driving or using heavy machinery.  Use a stool softener or a bulk laxative as told by your health care provider. Eating and drinking  Follow instructions from your health care provider about what to eat or drink after your procedure.  You may need to take actions to prevent or treat constipation, such as: ? Drink enough fluid to keep your urine pale yellow. ? Take over-the-counter or prescription medicines. ? Eat foods that are high in fiber, such as beans, whole grains, and fresh fruits and vegetables. ? Limit foods that are high in fat and processed sugars, such as fried or sweet foods. Activity   Rest as told by your health care provider.  Avoid sitting for a long time without moving. Get up to take short walks every 1-2 hours. This is important to improve blood flow and breathing. Ask for help if you feel weak or unsteady.  Return to your normal activities as told by your health care provider. Ask your health care provider what activities are safe for you.  Do not lift anything that is  heavier than 10 lb (4.5 kg), or the limit that you are told, until your health care provider says that it is safe.  Do not strain to have a bowel movement.  Do not spend a long time sitting on the toilet. General instructions   Take warm sitz baths for 15-20 minutes, 2-3 times a day to relieve soreness or itching and to keep the rectal area clean.  Apply ice packs to the area to reduce swelling and pain.  Do not drive for 24 hours if you were given a sedative during your procedure.  Keep all follow-up visits as told by your health care provider. This is important. Contact a health care provider if:  Your pain medicine is not helping.  You have a fever or chills.  You have bad smelling drainage.  You have a lot of swelling.  You become constipated.  You have trouble passing urine. Get help right away if:  You have very bad rectal pain.  You have heavy bleeding from your rectum. Summary  After the procedure, it is common to have pain and slight rectal bleeding.  Take warm sitz baths for 15-20 minutes, 2-3 times a day to relieve soreness or itching and to keep the rectal area clean.  Avoid straining when having a bowel movement.  Eat foods that are high in fiber, such as beans, whole grains, and fresh fruits and vegetables.  Take over-the-counter and prescription medicines only as told by your health care provider. This information is not intended to replace advice given to  you by your health care provider. Make sure you discuss any questions you have with your health care provider. Document Released: 09/18/2003 Document Revised: 05/16/2018 Document Reviewed: 05/16/2018 Elsevier Patient Education  2020 [employer].

## 2019-02-06 ENCOUNTER — Telehealth: Payer: Self-pay | Admitting: *Deleted

## 2019-02-06 ENCOUNTER — Encounter: Payer: Self-pay | Admitting: Surgery

## 2019-02-06 NOTE — Progress Notes (Signed)
Outpatient Surgical Follow Up  02/06/2019  Donald Foster is an 73 y.o. male.   Chief Complaint  Patient presents with  . Other    Discuss surgery     HPI: 74 year old male well-known to our practice with intermittent hematochezia and anorectal pain.  He reports that he continues to have some seepage in his underwear and some intermittent hematochezia when he wipes.  He does feel a lump on the right lateral side.  No fevers no chills.   Past Medical History:  Diagnosis Date  . COPD (chronic obstructive pulmonary disease) ([place])   . GERD (gastroesophageal reflux disease)   . Hypercholesteremia   . Hypertension     Past Surgical History:  Procedure Laterality Date  . CHOLECYSTECTOMY    . COLONOSCOPY WITH PROPOFOL N/A 08/17/2018   Procedure: COLONOSCOPY WITH PROPOFOL;  Surgeon: Lin Landsman, MD;  Location: [facility] ENDOSCOPY;  Service: Gastroenterology;  Laterality: N/A;  . ESOPHAGOGASTRODUODENOSCOPY (EGD) WITH PROPOFOL N/A 08/01/2018   Procedure: ESOPHAGOGASTRODUODENOSCOPY (EGD) WITH PROPOFOL;  Surgeon: Lin Landsman, MD;  Location[facility] ENDOSCOPY;  Service: Gastroenterology;  Laterality: N/A;  . UPPER GI ENDOSCOPY  08/17/2018   Procedure: UPPER GI ENDOSCOPY;  Surgeon: Lin Landsman, MD;  Location: [facility] ENDOSCOPY;  Service: Gastroenterology;;    Family History  Problem Relation Age of Onset  . Cancer Father     Social History:  reports that he has quit smoking. His smoking use included cigarettes. He started smoking about 9 months ago. He has a 42.75 pack-year smoking history. He has never used smokeless tobacco. He reports previous alcohol use. He reports previous drug use.  Allergies: No Known Allergies  Medications reviewed.    ROS Full ROS performed and is otherwise negative other than what is stated in HPI   BP 138/77   Pulse 84   Temp 98.1 F (36.7 C) (Skin)   Ht 5\' 7"  (1.702 m)   Wt 141 lb (64 kg)   SpO2 94%   BMI 22.08 kg/m   Physical  Exam Vitals signs and nursing note reviewed.  Constitutional:      Appearance: Normal appearance. He is normal weight.  Cardiovascular:     Rate and Rhythm: Normal rate and regular rhythm.     Heart sounds: No murmur.  Pulmonary:     Effort: Pulmonary effort is normal. No respiratory distress.     Breath sounds: Normal breath sounds.  Abdominal:     General: Abdomen is flat. There is no distension.     Tenderness: There is no abdominal tenderness. There is no guarding.  Genitourinary:    Comments: Rectal: There is a rectal lesion extending into the anus on the right anterolateral aspect. No ulceration. Likely Grade IV hemorrhoid, it has not change in size when compared to previous exam Skin:    General: Skin is warm and dry.     Capillary Refill: Capillary refill takes less than 2 seconds.  Neurological:     General: No focal deficit present.     Mental Status: He is alert and oriented to person, place, and time.  Psychiatric:        Mood and Affect: Mood normal.        Thought Content: Thought content normal.        Judgment: Judgment normal.      Assessment/Plan: 73 yo  grade 4 symptomatic hemorrhoid.  Although the lesion is consistent with hemorrhoid , malignancy can not be completely excluded.  Have discussed with the  patient in detail about the procedure.  Risk benefit and possible implications including but not limited to: Bleeding, infection, pain, anal stenosis.  He understands and wishes to proceed.  Greater than 50% of the 25 minutes  visit was spent in counseling/coordination of care   Caroleen Hamman, MD [place] Surgeon

## 2019-02-06 NOTE — H&P (View-Only) (Signed)
Outpatient Surgical Follow Up  02/06/2019  Donald Foster is an 73 y.o. male.   Chief Complaint  Patient presents with  . Other    Discuss surgery     HPI: 73 year old male well-known to our practice with intermittent hematochezia and anorectal pain.  He reports that he continues to have some seepage in his underwear and some intermittent hematochezia when he wipes.  He does feel a lump on the right lateral side.  No fevers no chills.   Past Medical History:  Diagnosis Date  . COPD (chronic obstructive pulmonary disease) ([place])   . GERD (gastroesophageal reflux disease)   . Hypercholesteremia   . Hypertension     Past Surgical History:  Procedure Laterality Date  . CHOLECYSTECTOMY    . COLONOSCOPY WITH PROPOFOL N/A 08/17/2018   Procedure: COLONOSCOPY WITH PROPOFOL;  Surgeon: Lin Landsman, MD;  Location: [facility] ENDOSCOPY;  Service: Gastroenterology;  Laterality: N/A;  . ESOPHAGOGASTRODUODENOSCOPY (EGD) WITH PROPOFOL N/A 08/01/2018   Procedure: ESOPHAGOGASTRODUODENOSCOPY (EGD) WITH PROPOFOL;  Surgeon: Lin Landsman, MD;  Location: [facility] ENDOSCOPY;  Service: Gastroenterology;  Laterality: N/A;  . UPPER GI ENDOSCOPY  08/17/2018   Procedure: UPPER GI ENDOSCOPY;  Surgeon: Lin Landsman, MD;  Location: [facility] ENDOSCOPY;  Service: Gastroenterology;;    Family History  Problem Relation Age of Onset  . Cancer Father     Social History:  reports that he has quit smoking. His smoking use included cigarettes. He started smoking about 9 months ago. He has a 42.75 pack-year smoking history. He has never used smokeless tobacco. He reports previous alcohol use. He reports previous drug use.  Allergies: No Known Allergies  Medications reviewed.    ROS Full ROS performed and is otherwise negative other than what is stated in HPI   BP 138/77   Pulse 84   Temp 98.1 F (36.7 C) (Skin)   Ht 5\' 7"  (1.702 m)   Wt 141 lb (64 kg)   SpO2 94%   BMI 22.08 kg/m   Physical  Exam Vitals signs and nursing note reviewed.  Constitutional:      Appearance: Normal appearance. He is normal weight.  Cardiovascular:     Rate and Rhythm: Normal rate and regular rhythm.     Heart sounds: No murmur.  Pulmonary:     Effort: Pulmonary effort is normal. No respiratory distress.     Breath sounds: Normal breath sounds.  Abdominal:     General: Abdomen is flat. There is no distension.     Tenderness: There is no abdominal tenderness. There is no guarding.  Genitourinary:    Comments: Rectal: There is a rectal lesion extending into the anus on the right anterolateral aspect. No ulceration. Likely Grade IV hemorrhoid, it has not change in size when compared to previous exam Skin:    General: Skin is warm and dry.     Capillary Refill: Capillary refill takes less than 2 seconds.  Neurological:     General: No focal deficit present.     Mental Status: He is alert and oriented to person, place, and time.  Psychiatric:        Mood and Affect: Mood normal.        Thought Content: Thought content normal.        Judgment: Judgment normal.      Assessment/Plan: 73 yo  grade 4 symptomatic hemorrhoid.  Although the lesion is consistent with hemorrhoid , malignancy can not be completely excluded.  Have discussed with the  patient in detail about the procedure.  Risk benefit and possible implications including but not limited to: Bleeding, infection, pain, anal stenosis.  He understands and wishes to proceed.  Greater than 50% of the 25 minutes  visit was spent in counseling/coordination of care   Donald Hamman, MD [place] Surgeon

## 2019-02-06 NOTE — Telephone Encounter (Signed)
I did call and speak with the patient's sister, Hassan Rowan today.   She will get in touch with the patient and ask him to call the office.   We need to get patient's hemorrhoidectomy scheduled with Dr. Dahlia Byes.   Patient will need to Pre-admit and have COVID testing done prior.

## 2019-02-08 ENCOUNTER — Telehealth: Payer: Self-pay | Admitting: *Deleted

## 2019-02-08 NOTE — Telephone Encounter (Signed)
Patient's sister, Hassan Rowan, was contacted again today since the patient has not called to arrange surgery yet with Dr. Dahlia Byes.   Sister states the patient said he was going to call our office today to arrange surgery.   Will await patient's call.

## 2019-02-08 NOTE — Telephone Encounter (Signed)
Message left on patient's sister, Brenda's phone for the patient to call the office back.   We need to notify either Hassan Rowan or the patient that he will need to Pre-admit and have COVID testing done on 02-19-19 at 11 am.   Instructions have also been mailed to the patient today.

## 2019-02-08 NOTE — Telephone Encounter (Signed)
Patient called the office back today.   The patient wishes to get surgery scheduled with Dr. Dahlia Byes for 02-22-19.   We will try and coordinate Pre-admit appointment and COVID testing for 02-19-19 per patient's request.   Patient has asked that we call his sister, Hassan Rowan with the appointment time for Pre-admit and COVID testing since he works from 7 am-5 pm.

## 2019-02-13 NOTE — Telephone Encounter (Signed)
Another message left for patient to call the office.

## 2019-02-14 NOTE — Telephone Encounter (Signed)
Patient has called back and was informed of all surgery/pre admission testing/COVID testing information. Patient understands all instructions.

## 2019-02-16 ENCOUNTER — Telehealth: Payer: Self-pay | Admitting: *Deleted

## 2019-02-16 NOTE — Telephone Encounter (Signed)
Received rereferral for lung screening scan. Attempted to contact patient to schedule at both numbers noted in EMR. There is no voicemail or answer at either number.

## 2019-02-19 ENCOUNTER — Other Ambulatory Visit: Payer: Self-pay

## 2019-02-19 ENCOUNTER — Encounter
Admission: RE | Admit: 2019-02-19 | Discharge: 2019-02-19 | Disposition: A | Discharge status: Home / Self Care | Payer: Medicare HMO | Source: Ambulatory Visit | Attending: Surgery | Admitting: Surgery

## 2019-02-19 DIAGNOSIS — Z79899 Other long term (current) drug therapy: Secondary | ICD-10-CM | POA: Diagnosis not present

## 2019-02-19 DIAGNOSIS — J449 Chronic obstructive pulmonary disease, unspecified: Secondary | ICD-10-CM | POA: Diagnosis not present

## 2019-02-19 DIAGNOSIS — Z7952 Long term (current) use of systemic steroids: Secondary | ICD-10-CM | POA: Diagnosis not present

## 2019-02-19 DIAGNOSIS — I10 Essential (primary) hypertension: Secondary | ICD-10-CM | POA: Diagnosis not present

## 2019-02-19 DIAGNOSIS — E7800 Pure hypercholesterolemia, unspecified: Secondary | ICD-10-CM | POA: Diagnosis not present

## 2019-02-19 DIAGNOSIS — Z87891 Personal history of nicotine dependence: Secondary | ICD-10-CM | POA: Diagnosis not present

## 2019-02-19 DIAGNOSIS — Z20828 Contact with and (suspected) exposure to other viral communicable diseases: Secondary | ICD-10-CM | POA: Diagnosis not present

## 2019-02-19 DIAGNOSIS — K219 Gastro-esophageal reflux disease without esophagitis: Secondary | ICD-10-CM | POA: Diagnosis not present

## 2019-02-19 DIAGNOSIS — K648 Other hemorrhoids: Secondary | ICD-10-CM | POA: Diagnosis not present

## 2019-02-19 NOTE — Patient Instructions (Addendum)
Your procedure is scheduled on: Thursday 02/22/19 Report to [place]. To find out your arrival time please call [phone] between 1PM - 3PM on Wednesday 02/21/19.  Remember: Instructions that are not followed completely may result in serious medical risk, up to and including death, or upon the discretion of your surgeon and anesthesiologist your surgery may need to be rescheduled.     _X__ 1. Do not eat food after midnight the night before your procedure.                 No gum chewing or hard candies. You may drink clear liquids up to 2 hours                 before you are scheduled to arrive for your surgery- DO not drink clear                 liquids within 2 hours of the start of your surgery.                 Clear Liquids include:  water, apple juice without pulp, clear carbohydrate                 drink such as Clearfast or Gatorade, Black Coffee or Tea (Do not add                 anything to coffee or tea). Diabetics water only  __X__2.  On the morning of surgery brush your teeth with toothpaste and water, you                 may rinse your mouth with mouthwash if you wish.  Do not swallow any              toothpaste of mouthwash.     _X__ 3.  No Alcohol for 24 hours before or after surgery.   _X__ 4.  Do Not Smoke or use e-cigarettes For 24 Hours Prior to Your Surgery.                 Do not use any chewable tobacco products for at least 6 hours prior to                 surgery.  ____  5.  Bring all medications with you on the day of surgery if instructed.   __X__  6.  Notify your doctor if there is any change in your medical condition      (cold, fever, infections).     Do not wear jewelry, make-up, hairpins, clips or nail polish. Do not wear lotions, powders, or perfumes.  Do not shave 48 hours prior to surgery. Men may shave face and neck. Do not bring valuables to the hospital.    [facility] is not responsible  for any belongings or valuables.  Contacts, dentures/partials or body piercings may not be worn into surgery. Bring a case for your contacts, glasses or hearing aids, a denture cup will be supplied. Leave your suitcase in the car. After surgery it may be brought to your room. For patients admitted to the hospital, discharge time is determined by your treatment team.   Patients discharged the day of surgery will not be allowed to drive home.   Please read over the following fact sheets that you were given:   MRSA Information  __X__ Take these medicines the morning of surgery with A SIP OF WATER:  1. amLODipine (NORVASC  2. omeprazole (PRILOSEC  3. tamsulosin (FLOMAX  4.  5.  6.  __x__ Fleet Enema (as directed) Enema the night before and morning of surgery  __X__ Use CHG Soap/SAGE wipes as directed  __X__ Use inhalers on the day of surgery   Albuterol, Spiriva, Advair (albuterol (VENTOLIN HFA, Fluticasone-Salmeterol (ADVAIR, SPIRIVA HANDIHALER)  ____ Stop metformin/Janumet/Farxiga 2 days prior to surgery    ____ Take 1/2 of usual insulin dose the night before surgery. No insulin the morning          of surgery.   ____ Stop Blood Thinners Coumadin/Plavix/Xarelto/Pleta/Pradaxa/Eliquis/Effient/Aspirin  on   Or contact your Surgeon, Cardiologist or Medical Doctor regarding  ability to stop your blood thinners  __X__ Stop Anti-inflammatories 7 days before surgery such as Advil, Ibuprofen, Motrin,  BC or Goodies Powder, Naprosyn, Naproxen, Aleve, Aspirin    __X__ Stop all herbal supplements, fish oil or vitamin E until after surgery.    ____ Bring C-Pap to the hospital.

## 2019-02-20 LAB — SARS CORONAVIRUS 2 (TAT 6-24 HRS): SARS Coronavirus 2: NEGATIVE

## 2019-02-20 NOTE — Pre-Procedure Instructions (Signed)
Was able to reach patients sister. Informed her of need for Donald Foster to either come here to pick up 2 fleet enemas or he could get them at the pharmacy of his choice. She called back and stated her sister could pick them up for him.

## 2019-02-21 ENCOUNTER — Telehealth: Payer: Self-pay | Admitting: *Deleted

## 2019-02-21 NOTE — Telephone Encounter (Signed)
Patient called the office and states he missed the time frame to call to find out arrival time for surgery tomorrow, 02-22-19.   I did call and speak with [facility] Day Surgery and they need patient to arrive at 10 am tomorrow.   The patient was instructed of arrival time. He was reminded to be NPO after midnight and have a driver. He verbalizes understanding of the above.

## 2019-02-22 ENCOUNTER — Ambulatory Visit
Admission: RE | Admit: 2019-02-22 | Discharge: 2019-02-22 | Disposition: A | Discharge status: Home / Self Care | Payer: Medicare HMO | Source: Ambulatory Visit | Attending: Surgery | Admitting: Surgery

## 2019-02-22 ENCOUNTER — Other Ambulatory Visit: Payer: Self-pay

## 2019-02-22 ENCOUNTER — Encounter
Admission: RE | Disposition: A | Discharge status: Home / Self Care | Payer: Self-pay | Source: Ambulatory Visit | Attending: Surgery

## 2019-02-22 ENCOUNTER — Ambulatory Visit: Payer: Medicare HMO | Admitting: Certified Registered Nurse Anesthetist

## 2019-02-22 ENCOUNTER — Encounter: Payer: Self-pay | Admitting: *Deleted

## 2019-02-22 DIAGNOSIS — K6289 Other specified diseases of anus and rectum: Secondary | ICD-10-CM

## 2019-02-22 DIAGNOSIS — Z87891 Personal history of nicotine dependence: Secondary | ICD-10-CM | POA: Insufficient documentation

## 2019-02-22 DIAGNOSIS — I10 Essential (primary) hypertension: Secondary | ICD-10-CM | POA: Insufficient documentation

## 2019-02-22 DIAGNOSIS — Z20828 Contact with and (suspected) exposure to other viral communicable diseases: Secondary | ICD-10-CM | POA: Insufficient documentation

## 2019-02-22 DIAGNOSIS — K648 Other hemorrhoids: Secondary | ICD-10-CM | POA: Diagnosis not present

## 2019-02-22 DIAGNOSIS — E7800 Pure hypercholesterolemia, unspecified: Secondary | ICD-10-CM | POA: Insufficient documentation

## 2019-02-22 DIAGNOSIS — K219 Gastro-esophageal reflux disease without esophagitis: Secondary | ICD-10-CM | POA: Insufficient documentation

## 2019-02-22 DIAGNOSIS — J449 Chronic obstructive pulmonary disease, unspecified: Secondary | ICD-10-CM | POA: Insufficient documentation

## 2019-02-22 DIAGNOSIS — Z7952 Long term (current) use of systemic steroids: Secondary | ICD-10-CM | POA: Insufficient documentation

## 2019-02-22 DIAGNOSIS — Z79899 Other long term (current) drug therapy: Secondary | ICD-10-CM | POA: Insufficient documentation

## 2019-02-22 HISTORY — PX: HEMORRHOID SURGERY: SHX153

## 2019-02-22 SURGERY — HEMORRHOIDECTOMY
Anesthesia: General

## 2019-02-22 MED ORDER — GABAPENTIN 300 MG PO CAPS
300.0000 mg | ORAL_CAPSULE | ORAL | Status: AC
  Administered 2019-02-22: 10:00:00 300 mg via ORAL

## 2019-02-22 MED ORDER — ONDANSETRON HCL 4 MG/2ML IJ SOLN
INTRAMUSCULAR | Status: DC | PRN
  Administered 2019-02-22: 4 mg via INTRAVENOUS

## 2019-02-22 MED ORDER — LACTATED RINGERS IV SOLN
INTRAVENOUS | Status: DC
  Administered 2019-02-22: 10:00:00 via INTRAVENOUS

## 2019-02-22 MED ORDER — ACETAMINOPHEN 500 MG PO TABS
ORAL_TABLET | ORAL | Status: AC
  Administered 2019-02-22: 10:00:00 1000 mg via ORAL
  Filled 2019-02-22: qty 2

## 2019-02-22 MED ORDER — SEVOFLURANE IN SOLN
RESPIRATORY_TRACT | Status: AC
  Filled 2019-02-22: qty 250

## 2019-02-22 MED ORDER — ACETAMINOPHEN 500 MG PO TABS
1000.0000 mg | ORAL_TABLET | ORAL | Status: AC
  Administered 2019-02-22: 10:00:00 1000 mg via ORAL

## 2019-02-22 MED ORDER — CHLORHEXIDINE GLUCONATE CLOTH 2 % EX PADS: 6.0000 | MEDICATED_PAD | Freq: Once | CUTANEOUS | Status: DC

## 2019-02-22 MED ORDER — BUPIVACAINE LIPOSOME 1.3 % IJ SUSP
INTRAMUSCULAR | Status: DC | PRN
  Administered 2019-02-22: 20 mL

## 2019-02-22 MED ORDER — FENTANYL CITRATE (PF) 100 MCG/2ML IJ SOLN
INTRAMUSCULAR | Status: AC
  Administered 2019-02-22: 14:00:00 25 ug via INTRAVENOUS
  Filled 2019-02-22: qty 2

## 2019-02-22 MED ORDER — PHENYLEPHRINE HCL (PRESSORS) 10 MG/ML IV SOLN
INTRAVENOUS | Status: DC | PRN
  Administered 2019-02-22: 100 ug via INTRAVENOUS
  Administered 2019-02-22: 200 ug via INTRAVENOUS

## 2019-02-22 MED ORDER — BUPIVACAINE LIPOSOME 1.3 % IJ SUSP
INTRAMUSCULAR | Status: AC
  Filled 2019-02-22: qty 20

## 2019-02-22 MED ORDER — LIDOCAINE HCL (CARDIAC) PF 100 MG/5ML IV SOSY
PREFILLED_SYRINGE | INTRAVENOUS | Status: DC | PRN
  Administered 2019-02-22: 100 mg via INTRAVENOUS

## 2019-02-22 MED ORDER — HYDROCODONE-ACETAMINOPHEN 5-325 MG PO TABS
1.0000 | ORAL_TABLET | Freq: Once | ORAL | Status: AC
  Administered 2019-02-22: 1 via ORAL

## 2019-02-22 MED ORDER — EPHEDRINE SULFATE 50 MG/ML IJ SOLN
INTRAMUSCULAR | Status: DC | PRN
  Administered 2019-02-22: 10 mg via INTRAVENOUS

## 2019-02-22 MED ORDER — PROMETHAZINE HCL 25 MG/ML IJ SOLN
12.5000 mg | INTRAMUSCULAR | Status: DC | PRN
  Administered 2019-02-22: 14:00:00 12.5 mg via INTRAVENOUS

## 2019-02-22 MED ORDER — PROMETHAZINE HCL 25 MG/ML IJ SOLN
INTRAMUSCULAR | Status: AC
  Administered 2019-02-22: 14:00:00 12.5 mg via INTRAVENOUS
  Filled 2019-02-22: qty 1

## 2019-02-22 MED ORDER — ONDANSETRON HCL 4 MG/2ML IJ SOLN
INTRAMUSCULAR | Status: AC
  Administered 2019-02-22: 14:00:00 4 mg via INTRAVENOUS
  Filled 2019-02-22: qty 2

## 2019-02-22 MED ORDER — HYDROCODONE-ACETAMINOPHEN 5-325 MG PO TABS
ORAL_TABLET | ORAL | Status: AC
  Filled 2019-02-22: qty 1

## 2019-02-22 MED ORDER — SODIUM CHLORIDE FLUSH 0.9 % IV SOLN
INTRAVENOUS | Status: AC
  Filled 2019-02-22: qty 10

## 2019-02-22 MED ORDER — FENTANYL CITRATE (PF) 100 MCG/2ML IJ SOLN
INTRAMUSCULAR | Status: DC | PRN
  Administered 2019-02-22: 50 ug via INTRAVENOUS

## 2019-02-22 MED ORDER — PROPOFOL 10 MG/ML IV BOLUS
INTRAVENOUS | Status: DC | PRN
  Administered 2019-02-22: 130 mg via INTRAVENOUS

## 2019-02-22 MED ORDER — HYDROCODONE-ACETAMINOPHEN 5-325 MG PO TABS: 1.0000 | ORAL_TABLET | ORAL | 0 refills | Status: DC | PRN

## 2019-02-22 MED ORDER — GABAPENTIN 300 MG PO CAPS
ORAL_CAPSULE | ORAL | Status: AC
  Administered 2019-02-22: 10:00:00 300 mg via ORAL
  Filled 2019-02-22: qty 1

## 2019-02-22 MED ORDER — ONDANSETRON HCL 4 MG/2ML IJ SOLN
4.0000 mg | Freq: Once | INTRAMUSCULAR | Status: AC | PRN
  Administered 2019-02-22: 14:00:00 4 mg via INTRAVENOUS

## 2019-02-22 MED ORDER — FENTANYL CITRATE (PF) 100 MCG/2ML IJ SOLN
INTRAMUSCULAR | Status: AC
  Filled 2019-02-22: qty 2

## 2019-02-22 MED ORDER — FAMOTIDINE 20 MG PO TABS
20.0000 mg | ORAL_TABLET | Freq: Once | ORAL | Status: AC
  Administered 2019-02-22: 20 mg via ORAL

## 2019-02-22 MED ORDER — FAMOTIDINE 20 MG PO TABS
ORAL_TABLET | ORAL | Status: AC
  Filled 2019-02-22: qty 1

## 2019-02-22 MED ORDER — FENTANYL CITRATE (PF) 100 MCG/2ML IJ SOLN
25.0000 ug | INTRAMUSCULAR | Status: DC | PRN
  Administered 2019-02-22 (×4): 25 ug via INTRAVENOUS

## 2019-02-22 SURGICAL SUPPLY — 27 items
BLADE CLIPPER SURG (BLADE) ×3 IMPLANT
BLADE SURG 15 STRL LF DISP TIS (BLADE) ×1 IMPLANT
BLADE SURG 15 STRL SS (BLADE) ×2
BRIEF STRETCH MATERNITY 2XLG (MISCELLANEOUS) ×3 IMPLANT
CANISTER SUCT 1200ML W/VALVE (MISCELLANEOUS) ×3 IMPLANT
COVER WAND RF STERILE (DRAPES) ×3 IMPLANT
DRAPE 3/4 80X56 (DRAPES) ×3 IMPLANT
DRAPE LEGGINS SURG 28X43 STRL (DRAPES) ×3 IMPLANT
DRAPE UNDER BUTTOCK W/FLU (DRAPES) ×3 IMPLANT
DRSG GAUZE FLUFF 36X18 (GAUZE/BANDAGES/DRESSINGS) ×3 IMPLANT
ELECT CAUTERY BLADE 6.4 (BLADE) ×3 IMPLANT
ELECT REM PT RETURN 9FT ADLT (ELECTROSURGICAL) ×3
ELECTRODE REM PT RTRN 9FT ADLT (ELECTROSURGICAL) ×1 IMPLANT
GLOVE BIO SURGEON STRL SZ7 (GLOVE) ×3 IMPLANT
GOWN STRL REUS W/ TWL LRG LVL3 (GOWN DISPOSABLE) ×2 IMPLANT
GOWN STRL REUS W/TWL LRG LVL3 (GOWN DISPOSABLE) ×4
HARMONIC SCALPEL FOCUS (MISCELLANEOUS) ×2 IMPLANT
NEEDLE HYPO 22GX1.5 SAFETY (NEEDLE) ×3 IMPLANT
NS IRRIG 500ML POUR BTL (IV SOLUTION) ×3 IMPLANT
PACK BASIN MINOR ARMC (MISCELLANEOUS) ×3 IMPLANT
PAD PREP 24X41 OB/GYN DISP (PERSONAL CARE ITEMS) ×3 IMPLANT
SHEARS HARMONIC 9CM CVD (BLADE) ×3 IMPLANT
SOL PREP PVP 2OZ (MISCELLANEOUS) ×3
SOLUTION PREP PVP 2OZ (MISCELLANEOUS) ×1 IMPLANT
SPONGE LAP 18X18 RF (DISPOSABLE) ×3 IMPLANT
SURGILUBE 2OZ TUBE FLIPTOP (MISCELLANEOUS) ×3 IMPLANT
SYR 20ML LL LF (SYRINGE) ×3 IMPLANT

## 2019-02-22 NOTE — Anesthesia Post-op Follow-up Note (Signed)
Anesthesia QCDR form completed.        

## 2019-02-22 NOTE — Interval H&P Note (Signed)
History and Physical Interval Note:  02/22/2019 11:55 AM  Donald Foster  has presented today for surgery, with the diagnosis of Hemorrhoids.  The various methods of treatment have been discussed with the patient and family. After consideration of risks, benefits and other options for treatment, the patient has consented to  Procedure(s): HEMORRHOIDECTOMY (N/A) as a surgical intervention.  The patient's history has been reviewed, patient examined, no change in status, stable for surgery.  I have reviewed the patient's chart and labs.  Questions were answered to the patient's satisfaction.     [place]

## 2019-02-22 NOTE — Anesthesia Procedure Notes (Signed)
Procedure Name: LMA Insertion Date/Time: 02/22/2019 12:43 PM Performed by: Nelda Marseille, CRNA Pre-anesthesia Checklist: Patient identified, Patient being monitored, Timeout performed, Emergency Drugs available and Suction available Patient Re-evaluated:Patient Re-evaluated prior to induction Oxygen Delivery Method: Circle system utilized Preoxygenation: Pre-oxygenation with 100% oxygen Induction Type: IV induction Ventilation: Mask ventilation without difficulty LMA: LMA inserted LMA Size: 4.0 Tube type: Oral Number of attempts: 1 Placement Confirmation: positive ETCO2 and breath sounds checked- equal and bilateral Tube secured with: Tape Dental Injury: Teeth and Oropharynx as per pre-operative assessment

## 2019-02-22 NOTE — Anesthesia Preprocedure Evaluation (Signed)
Anesthesia Evaluation  Patient identified by MRN, date of birth, ID band Patient awake    Reviewed: Allergy & Precautions, H&P , NPO status , Patient's Chart, lab work & pertinent test results, reviewed documented beta blocker date and time   Airway Mallampati: II  TM Distance: >3 FB Neck ROM: full    Dental  (+) Teeth Intact   Pulmonary COPD,  COPD inhaler, former smoker,    Pulmonary exam normal        Cardiovascular Exercise Tolerance: Good hypertension, On Medications negative cardio ROS Normal cardiovascular exam Rate:Normal     Neuro/Psych negative neurological ROS  negative psych ROS   GI/Hepatic Neg liver ROS, PUD, GERD  ,  Endo/Other  negative endocrine ROS  Renal/GU negative Renal ROS  negative genitourinary   Musculoskeletal   Abdominal   Peds  Hematology  (+) Blood dyscrasia, anemia ,   Anesthesia Other Findings   Reproductive/Obstetrics negative OB ROS                             Anesthesia Physical Anesthesia Plan  ASA: III  Anesthesia Plan: General LMA   Post-op Pain Management:    Induction:   PONV Risk Score and Plan:   Airway Management Planned:   Additional Equipment:   Intra-op Plan:   Post-operative Plan:   Informed Consent: I have reviewed the patients History and Physical, chart, labs and discussed the procedure including the risks, benefits and alternatives for the proposed anesthesia with the patient or authorized representative who has indicated his/her understanding and acceptance.       Plan Discussed with: CRNA  Anesthesia Plan Comments:         Anesthesia Quick Evaluation

## 2019-02-22 NOTE — Transfer of Care (Signed)
Immediate Anesthesia Transfer of Care Note  Patient: [facility]  Procedure(s) Performed: HEMORRHOIDECTOMY (N/A )  Patient Location: PACU  Anesthesia Type:General  Level of Consciousness: awake, alert  and oriented  Airway & Oxygen Therapy: Patient Spontanous Breathing and Patient connected to nasal cannula oxygen  Post-op Assessment: Report given to RN and Post -op Vital signs reviewed and stable  Post vital signs: Reviewed and stable  Last Vitals:  Vitals Value Taken Time  BP 134/74 02/22/19 1307  Temp    Pulse 69 02/22/19 1308  Resp 12 02/22/19 1308  SpO2 98 % 02/22/19 1308  Vitals shown include unvalidated device data.  Last Pain:  Vitals:   02/22/19 1004  TempSrc: Temporal  PainSc: 0-No pain         Complications: No apparent anesthesia complications

## 2019-02-22 NOTE — Discharge Instructions (Addendum)

## 2019-02-22 NOTE — Op Note (Signed)
  02/22/2019  12:59 PM  PATIENT:  [name]  73 y.o. male  PRE-OPERATIVE DIAGNOSIS:  Internal hemorrhoids  POST-OPERATIVE DIAGNOSIS:  Same  PROCEDURE: 1. Exam under anesthesia 2. 2 column open hemorrhoidectomy ( Anterior midline and Left posterolateral.  SURGEON:  Surgeon(s) and Role:    * Dasie Chancellor F, MD - Primary  FINDINGS: Left posterolateral grade IV hemorrhoid Anterior midline grade IV hemorrhoid Right posterolateral grade III hemorrrhoid  ANESTHESIA: GETA   DICTATION:  Patient was explained about the procedure in detail.  Risks, benefits and possible complications and a consent was obtained.  Patient was taken to the operating room and placed in the lithotomy position with all the pressure points padded.  Exam under anesthesia revealed evidence of 3 internal hemorrhoidal columns: The biggest one being on the anterior midline and left posterolateral.  The other smaller one was located in the right posterolateral.  We went ahead and perform a 2: Hemorrhoidectomy of the largest hemorrhoidal cushions using the harmonic scalpel in the standard fashion.  There was good sealing and good hemostasis.  We avoided a third hemorrhoidectomy due to the chance of anal stenosis.  Liposomal Marcaine was injected around the anorectal region for postoperative analgesia  Needle and laparotomy counts were correct and there were no immediate complications  Jules Husbands, MD

## 2019-02-23 ENCOUNTER — Telehealth: Payer: Self-pay | Admitting: *Deleted

## 2019-02-23 ENCOUNTER — Telehealth: Payer: Self-pay

## 2019-02-23 LAB — SURGICAL PATHOLOGY

## 2019-02-23 NOTE — Telephone Encounter (Signed)
Received referral for low dose lung cancer screening CT scan. Message left at phone number listed in EMR for patient to call me back to facilitate scheduling scan.  

## 2019-02-23 NOTE — Telephone Encounter (Signed)
Left message for patient to call office regarding pathology results.

## 2019-02-28 NOTE — Anesthesia Postprocedure Evaluation (Signed)
Anesthesia Post Note  Patient: Donald Foster [facility]  Procedure(s) Performed: HEMORRHOIDECTOMY (N/A )  Patient location during evaluation: PACU Anesthesia Type: General Level of consciousness: awake and alert Pain management: pain level controlled Vital Signs Assessment: post-procedure vital signs reviewed and stable Respiratory status: spontaneous breathing, nonlabored ventilation, respiratory function stable and patient connected to nasal cannula oxygen Cardiovascular status: blood pressure returned to baseline and stable Postop Assessment: no apparent nausea or vomiting Anesthetic complications: no     Last Vitals:  Vitals:   02/22/19 1437 02/22/19 1449  BP: 138/73 (!) 149/72  Pulse: 63 (!) 56  Resp: 12 16  Temp: (!) 36.3 C (!) 35.8 C  SpO2: 96% 94%    Last Pain:  Vitals:   02/22/19 1515  TempSrc:   PainSc: 7                  Molli Barrows

## 2019-03-07 ENCOUNTER — Encounter: Payer: Medicare HMO | Admitting: Physician Assistant

## 2019-03-14 ENCOUNTER — Ambulatory Visit (INDEPENDENT_AMBULATORY_CARE_PROVIDER_SITE_OTHER): Payer: Medicare HMO | Admitting: Physician Assistant

## 2019-03-14 ENCOUNTER — Other Ambulatory Visit: Payer: Self-pay

## 2019-03-14 ENCOUNTER — Encounter: Payer: Self-pay | Admitting: Physician Assistant

## 2019-03-14 VITALS — BP 159/83 | HR 94 | Temp 97.5°F | Resp 16 | Ht 63.0 in | Wt 139.4 lb

## 2019-03-14 DIAGNOSIS — Z09 Encounter for follow-up examination after completed treatment for conditions other than malignant neoplasm: Secondary | ICD-10-CM

## 2019-03-14 DIAGNOSIS — K629 Disease of anus and rectum, unspecified: Secondary | ICD-10-CM

## 2019-03-14 NOTE — Progress Notes (Signed)
[facility] POST-OP OFFICE VISIT  03/14/2019  HPI: Donald Foster is a 73 y.o. male 20 days s/p hemorrhoidectomy with Dr Dahlia Byes.   Overall, he reports that he is doing much better. No longer having any drainage or staining of his underwear. No issues with pain. He does have intermittent constipation despite taking stool softeners. Intermittent itching as well. Still doing sitz baths. No other issues.   Vital signs: BP (!) 159/83   Pulse 94   Temp (!) 97.5 F (36.4 C) (Temporal)   Resp 16   Ht 5\' 3"  (1.6 m)   Wt 139 lb 6.4 oz (63.2 kg)   SpO2 91%   BMI 24.69 kg/m    Physical Exam: Constitutional: Well appearing male, NAD GU: Hemorrhoidectomy site well healed, no erythema or drainage appreciable, non-tender  Assessment/Plan: This is a 73 y.o. male overall doing well 20 days s/p hemorrhoidectomy.   - Add Miralax daily prn to aid with constipation management  - continue sitz baths as needed  - follow up prn, advised to call with any new questions or concerns  -- Edison Simon, PA-C [facility] 03/14/2019, 9:07 AM [phone] M-F: 7am - 4pm

## 2019-03-14 NOTE — Patient Instructions (Signed)
Use Miralax when needed to avoid constipation.

## 2019-03-21 ENCOUNTER — Encounter: Payer: Self-pay | Admitting: *Deleted

## 2019-04-03 ENCOUNTER — Telehealth: Payer: Self-pay | Admitting: *Deleted

## 2019-04-03 DIAGNOSIS — Z122 Encounter for screening for malignant neoplasm of respiratory organs: Secondary | ICD-10-CM

## 2019-04-03 DIAGNOSIS — Z87891 Personal history of nicotine dependence: Secondary | ICD-10-CM

## 2019-04-03 NOTE — Telephone Encounter (Signed)
Received referral for initial lung cancer screening scan. Contacted patient and obtained smoking history,(former, quit 03/12/18, 42.75 pack year) as well as answering questions related to screening process. Patient denies signs of lung cancer such as weight loss or hemoptysis. Patient denies comorbidity that would prevent curative treatment if lung cancer were found. Patient is scheduled for shared decision making visit and CT scan on 04/17/19 at 130pm.

## 2019-04-16 ENCOUNTER — Encounter: Payer: Self-pay | Admitting: Nurse Practitioner

## 2019-04-17 ENCOUNTER — Ambulatory Visit
Admission: RE | Admit: 2019-04-17 | Discharge: 2019-04-17 | Disposition: A | Discharge status: Home / Self Care | Payer: Medicare HMO | Source: Ambulatory Visit | Attending: Oncology | Admitting: Oncology

## 2019-04-17 ENCOUNTER — Inpatient Hospital Stay: Payer: Medicare HMO | Attending: Nurse Practitioner | Admitting: Nurse Practitioner

## 2019-04-17 ENCOUNTER — Other Ambulatory Visit: Payer: Self-pay

## 2019-04-17 DIAGNOSIS — Z87891 Personal history of nicotine dependence: Secondary | ICD-10-CM

## 2019-04-17 DIAGNOSIS — Z122 Encounter for screening for malignant neoplasm of respiratory organs: Secondary | ICD-10-CM | POA: Insufficient documentation

## 2019-04-17 NOTE — Progress Notes (Signed)
Virtual Visit via Video Enabled Telemedicine Note   I connected with Donald Foster on 04/17/19 at 1:00 EST by video enabled telemedicine visit and verified that I am speaking with the correct person using two identifiers.   I discussed the limitations, risks, security and privacy concerns of performing an evaluation and management service by telemedicine and the availability of in-person appointments. I also discussed with the patient that there may be a patient responsible charge related to this service. The patient expressed understanding and agreed to proceed.   Other persons participating in the visit and their role in the encounter: Burgess Estelle, RN- checking in patient & navigation  Patient's location: imaging center  Provider's location: clinic  Chief Complaint: Low Dose CT Screening  Patient agreed to evaluation by telemedicine to discuss shared decision making for consideration of low dose CT lung cancer screening.    In accordance with CMS guidelines, patient has met eligibility criteria including age, absence of signs or symptoms of lung cancer.  Social History   Tobacco Use  . Smoking status: Former Smoker    Packs/day: 0.75    Years: 57.00    Pack years: 42.75    Types: Cigarettes    Start date: 03/12/2018  . Smokeless tobacco: Never Used  Substance Use Topics  . Alcohol use: Not Currently     A shared decision-making session was conducted prior to the performance of CT scan. This includes one or more decision aids, includes benefits and harms of screening, follow-up diagnostic testing, over-diagnosis, false positive rate, and total radiation exposure.   Counseling on the importance of adherence to annual lung cancer LDCT screening, impact of co-morbidities, and ability or willingness to undergo diagnosis and treatment is imperative for compliance of the program.   Counseling on the importance of continued smoking cessation for former smokers; the importance of  smoking cessation for current smokers, and information about tobacco cessation interventions have been given to patient including [place] and [employer] [place] programs.   Written order for lung cancer screening with LDCT has been given to the patient and any and all questions have been answered to the best of my abilities.    Yearly follow up will be coordinated by Burgess Estelle, Thoracic Navigator.  I discussed the assessment and treatment plan with the patient. The patient was provided an opportunity to ask questions and all were answered. The patient agreed with the plan and demonstrated an understanding of the instructions.   The patient was advised to call back or seek an in-person evaluation if the symptoms worsen or if the condition fails to improve as anticipated.   I provided 15 minutes of face-to-face video visit time during this encounter, and > 50% was spent counseling as documented under my assessment & plan.   Beckey Rutter, DNP, AGNP-C [place] [facility] [phone] (work cell) [phone] (office)

## 2019-04-19 ENCOUNTER — Encounter: Payer: Self-pay | Admitting: *Deleted

## 2019-08-06 ENCOUNTER — Encounter: Payer: Self-pay | Admitting: Emergency Medicine

## 2019-08-06 ENCOUNTER — Emergency Department: Payer: Medicare HMO

## 2019-08-06 ENCOUNTER — Emergency Department
Admission: EM | Admit: 2019-08-06 | Discharge: 2019-08-06 | Disposition: A | Discharge status: Home / Self Care | Payer: Medicare HMO | Attending: Emergency Medicine | Admitting: Emergency Medicine

## 2019-08-06 ENCOUNTER — Other Ambulatory Visit: Payer: Self-pay

## 2019-08-06 DIAGNOSIS — J441 Chronic obstructive pulmonary disease with (acute) exacerbation: Secondary | ICD-10-CM

## 2019-08-06 DIAGNOSIS — I10 Essential (primary) hypertension: Secondary | ICD-10-CM | POA: Insufficient documentation

## 2019-08-06 DIAGNOSIS — J449 Chronic obstructive pulmonary disease, unspecified: Secondary | ICD-10-CM | POA: Diagnosis not present

## 2019-08-06 DIAGNOSIS — Z79899 Other long term (current) drug therapy: Secondary | ICD-10-CM | POA: Diagnosis not present

## 2019-08-06 DIAGNOSIS — G44209 Tension-type headache, unspecified, not intractable: Secondary | ICD-10-CM | POA: Insufficient documentation

## 2019-08-06 DIAGNOSIS — R5383 Other fatigue: Secondary | ICD-10-CM | POA: Insufficient documentation

## 2019-08-06 DIAGNOSIS — R531 Weakness: Secondary | ICD-10-CM | POA: Insufficient documentation

## 2019-08-06 DIAGNOSIS — Z87891 Personal history of nicotine dependence: Secondary | ICD-10-CM | POA: Insufficient documentation

## 2019-08-06 DIAGNOSIS — R0789 Other chest pain: Secondary | ICD-10-CM | POA: Diagnosis not present

## 2019-08-06 DIAGNOSIS — R519 Headache, unspecified: Secondary | ICD-10-CM | POA: Diagnosis present

## 2019-08-06 DIAGNOSIS — Z20822 Contact with and (suspected) exposure to covid-19: Secondary | ICD-10-CM | POA: Diagnosis not present

## 2019-08-06 LAB — BASIC METABOLIC PANEL
Anion gap: 12 (ref 5–15)
BUN: 16 mg/dL (ref 8–23)
CO2: 23 mmol/L (ref 22–32)
Calcium: 8.7 mg/dL — ABNORMAL LOW (ref 8.9–10.3)
Chloride: 106 mmol/L (ref 98–111)
Creatinine, Ser: 1.37 mg/dL — ABNORMAL HIGH (ref 0.61–1.24)
GFR calc Af Amer: 60 mL/min — ABNORMAL LOW (ref >60)
GFR calc non Af Amer: 52 mL/min — ABNORMAL LOW (ref >60)
Glucose, Bld: 103 mg/dL — ABNORMAL HIGH (ref 70–99)
Potassium: 3.1 mmol/L — ABNORMAL LOW (ref 3.5–5.1)
Sodium: 141 mmol/L (ref 135–145)

## 2019-08-06 LAB — BLOOD GAS, VENOUS
Acid-Base Excess: 3.9 mmol/L — ABNORMAL HIGH (ref 0.0–2.0)
Bicarbonate: 28.5 mmol/L — ABNORMAL HIGH (ref 20.0–28.0)
O2 Saturation: 87.8 %
Patient temperature: 37
pCO2, Ven: 42 mmHg — ABNORMAL LOW (ref 44.0–60.0)
pH, Ven: 7.44 — ABNORMAL HIGH (ref 7.250–7.430)
pO2, Ven: 52 mmHg — ABNORMAL HIGH (ref 32.0–45.0)

## 2019-08-06 LAB — CBC
HCT: 40.1 % (ref 39.0–52.0)
Hemoglobin: 12.9 g/dL — ABNORMAL LOW (ref 13.0–17.0)
MCH: 24 pg — ABNORMAL LOW (ref 26.0–34.0)
MCHC: 32.2 g/dL (ref 30.0–36.0)
MCV: 74.7 fL — ABNORMAL LOW (ref 80.0–100.0)
Platelets: 210 10*3/uL (ref 150–400)
RBC: 5.37 MIL/uL (ref 4.22–5.81)
RDW: 17.2 % — ABNORMAL HIGH (ref 11.5–15.5)
WBC: 3.3 10*3/uL — ABNORMAL LOW (ref 4.0–10.5)
nRBC: 0 % (ref 0.0–0.2)

## 2019-08-06 LAB — TROPONIN I (HIGH SENSITIVITY)
Troponin I (High Sensitivity): 9 ng/L (ref <18)
Troponin I (High Sensitivity): 9 ng/L (ref <18)

## 2019-08-06 LAB — MAGNESIUM: Magnesium: 1.7 mg/dL (ref 1.7–2.4)

## 2019-08-06 MED ORDER — IPRATROPIUM-ALBUTEROL 0.5-2.5 (3) MG/3ML IN SOLN
3.0000 mL | Freq: Once | RESPIRATORY_TRACT | Status: AC
  Administered 2019-08-06: 3 mL via RESPIRATORY_TRACT
  Filled 2019-08-06: qty 3

## 2019-08-06 MED ORDER — POTASSIUM CHLORIDE CRYS ER 20 MEQ PO TBCR
40.0000 meq | EXTENDED_RELEASE_TABLET | Freq: Once | ORAL | Status: AC
  Administered 2019-08-06: 40 meq via ORAL
  Filled 2019-08-06: qty 2

## 2019-08-06 MED ORDER — DIPHENHYDRAMINE HCL 50 MG/ML IJ SOLN
25.0000 mg | Freq: Once | INTRAMUSCULAR | Status: AC
  Administered 2019-08-06: 25 mg via INTRAVENOUS
  Filled 2019-08-06: qty 1

## 2019-08-06 MED ORDER — KETOROLAC TROMETHAMINE 30 MG/ML IJ SOLN
15.0000 mg | Freq: Once | INTRAMUSCULAR | Status: AC
  Administered 2019-08-06: 15 mg via INTRAVENOUS
  Filled 2019-08-06: qty 1

## 2019-08-06 MED ORDER — PREDNISONE 20 MG PO TABS
60.0000 mg | ORAL_TABLET | Freq: Once | ORAL | Status: AC
  Administered 2019-08-06: 60 mg via ORAL
  Filled 2019-08-06: qty 3

## 2019-08-06 MED ORDER — TRAMADOL HCL 50 MG PO TABS: 50.0000 mg | ORAL_TABLET | Freq: Two times a day (BID) | ORAL | 0 refills | Status: DC | PRN

## 2019-08-06 MED ORDER — ACETAMINOPHEN 500 MG PO TABS
1000.0000 mg | ORAL_TABLET | Freq: Once | ORAL | Status: AC
  Administered 2019-08-06: 1000 mg via ORAL
  Filled 2019-08-06: qty 2

## 2019-08-06 MED ORDER — PROCHLORPERAZINE EDISYLATE 10 MG/2ML IJ SOLN
10.0000 mg | Freq: Once | INTRAMUSCULAR | Status: AC
  Administered 2019-08-06: 10 mg via INTRAVENOUS
  Filled 2019-08-06: qty 2

## 2019-08-06 MED ORDER — PREDNISONE 20 MG PO TABS: 40.0000 mg | ORAL_TABLET | Freq: Every day | ORAL | 0 refills | Status: AC

## 2019-08-06 NOTE — Consult Note (Signed)
Reason for Consult:weakness  Referring Physician: Dr. Ellender Hose   CC: weakness   HPI: Donald Foster is an 74 y.o. male with hx of COPD, HTN, GERD, HLD presents with 1 week hx of on and off symptoms that include SOB, generalized weakness. Patient states symptoms wax/wayne through the out the day. States symptoms fluctuates and last few minutes then resolve. Do not progress as the day progresses.   Past Medical History:  Diagnosis Date  . COPD (chronic obstructive pulmonary disease) ([place])   . GERD (gastroesophageal reflux disease)   . Hypercholesteremia   . Hypertension     Past Surgical History:  Procedure Laterality Date  . CHOLECYSTECTOMY    . COLONOSCOPY WITH PROPOFOL N/A 08/17/2018   Procedure: COLONOSCOPY WITH PROPOFOL;  Surgeon: Lin Landsman, MD;  Location: [facility] ENDOSCOPY;  Service: Gastroenterology;  Laterality: N/A;  . ESOPHAGOGASTRODUODENOSCOPY (EGD) WITH PROPOFOL N/A 08/01/2018   Procedure: ESOPHAGOGASTRODUODENOSCOPY (EGD) WITH PROPOFOL;  Surgeon: Lin Landsman, MD;  Location: [facility] ENDOSCOPY;  Service: Gastroenterology;  Laterality: N/A;  . HEMORRHOID SURGERY N/A 02/22/2019   Procedure: HEMORRHOIDECTOMY;  Surgeon: Jules Husbands, MD;  Location: [facility] ORS;  Service: General;  Laterality: N/A;  . UPPER GI ENDOSCOPY  08/17/2018   Procedure: UPPER GI ENDOSCOPY;  Surgeon: Lin Landsman, MD;  Location: [facility] ENDOSCOPY;  Service: Gastroenterology;;    Family History  Problem Relation Age of Onset  . Cancer Father     Social History:  reports that he has quit smoking. His smoking use included cigarettes. He started smoking about 16 months ago. He has a 42.75 pack-year smoking history. He has never used smokeless tobacco. He reports previous alcohol use. He reports previous drug use.  No Known Allergies  Medications: I have reviewed the patient's current medications.  ROS: History obtained from the patient  General ROS: negative for - chills, fatigue, fever, night  sweats, weight gain or weight loss Psychological ROS: negative for - behavioral disorder, hallucinations, memory difficulties, mood swings or suicidal ideation Ophthalmic ROS: negative for - blurry vision, double vision, eye pain or loss of vision ENT ROS: negative for - epistaxis, nasal discharge, oral lesions, sore throat, tinnitus or vertigo Allergy and Immunology ROS: negative for - hives or itchy/watery eyes Hematological and Lymphatic ROS: negative for - bleeding problems, bruising or swollen lymph nodes Endocrine ROS: negative for - galactorrhea, hair pattern changes, polydipsia/polyuria or temperature intolerance Respiratory ROS: negative for -SOB Cardiovascular ROS: negative for - chest pain, dyspnea on exertion, edema or irregular heartbeat Gastrointestinal ROS: negative for - abdominal pain, diarrhea, hematemesis, nausea/vomiting or stool incontinence Genito-Urinary ROS: negative for - dysuria, hematuria, incontinence or urinary frequency/urgency Musculoskeletal ROS: negative for - joint swelling or muscular weakness Neurological ROS: as noted in HPI Dermatological ROS: negative for rash and skin lesion changes  Physical Examination: Blood pressure (!) 164/100, pulse 95, temperature 98.5 F (36.9 C), temperature source Oral, resp. rate 20, height 5\' 3"  (1.6 m), weight 65.3 kg, SpO2 96 %.   Neurological Examination   Mental Status: Alert, oriented follows commands  Cranial Nerves: II: Discs flat bilaterally; Visual fields grossly normal, pupils equal, round, reactive to light and accommodation III,IV, VI: ptosis not present, extra-ocular motions intact bilaterally V,VII: smile symmetric, facial light touch sensation normal bilaterally VIII: hearing normal bilaterally IX,X: gag reflex present XI: bilateral shoulder shrug XII: midline tongue extension Motor: Right : Upper extremity   5/5    Left:     Upper extremity   5/5  Lower extremity  5/5     Lower extremity   5/5 Tone  and bulk:normal tone throughout; no atrophy noted Sensory: Pinprick and light touch intact throughout, bilaterally Deep Tendon Reflexes: 1+ and symmetric throughout Plantars: Right: downgoing   Left: downgoing Cerebellar: normal finger-to-nose      Laboratory Studies:   Basic Metabolic Panel: Recent Labs  Lab 08/06/19 0826  NA 141  K 3.1*  CL 106  CO2 23  GLUCOSE 103*  BUN 16  CREATININE 1.37*  CALCIUM 8.7*  MG 1.7    Liver Function Tests: No results for input(s): AST, ALT, ALKPHOS, BILITOT, PROT, ALBUMIN in the last 168 hours. No results for input(s): LIPASE, AMYLASE in the last 168 hours. No results for input(s): AMMONIA in the last 168 hours.  CBC: Recent Labs  Lab 08/06/19 0826  WBC 3.3*  HGB 12.9*  HCT 40.1  MCV 74.7*  PLT 210    Cardiac Enzymes: No results for input(s): CKTOTAL, CKMB, CKMBINDEX, TROPONINI in the last 168 hours.  BNP: Invalid input(s): POCBNP  CBG: No results for input(s): GLUCAP in the last 168 hours.  Microbiology: Results for orders placed or performed during the hospital encounter of 02/19/19  SARS CORONAVIRUS 2 Nasal Swab Aptima Multi Swab     Status: None   Collection Time: 02/19/19 12:23 PM   Specimen: Aptima Multi Swab; Nasal Swab  Result Value Ref Range Status   SARS Coronavirus 2 NEGATIVE NEGATIVE Final    Comment: (NOTE) SARS-CoV-2 target nucleic acids are NOT DETECTED. The SARS-CoV-2 RNA is generally detectable in upper and lower respiratory specimens during the acute phase of infection. Negative results do not preclude SARS-CoV-2 infection, do not rule out co-infections with other pathogens, and should not be used as the sole basis for treatment or other patient management decisions. Negative results must be combined with clinical observations, patient history, and epidemiological information. The expected result is Negative. Fact Sheet for Patients: SugarRoll.be Fact Sheet for  Healthcare Providers: https://www.woods-mathews.com/ This test is not yet approved or cleared by the [place] FDA and  has been authorized for detection and/or diagnosis of SARS-CoV-2 by FDA under an Emergency Use Authorization (EUA). This EUA will remain  in effect (meaning this test can be used) for the duration of the COVID-19 declaration under Section 56 4(b)(1) of the Act, 21 U.S.C. section 360bbb-3(b)(1), unless the authorization is terminated or revoked sooner. Performed at [facility], [place] [address], [place], [place] [address]     Coagulation Studies: No results for input(s): LABPROT, INR in the last 72 hours.  Urinalysis: No results for input(s): COLORURINE, LABSPEC, PHURINE, GLUCOSEU, HGBUR, BILIRUBINUR, KETONESUR, PROTEINUR, UROBILINOGEN, NITRITE, LEUKOCYTESUR in the last 168 hours.  Invalid input(s): APPERANCEUR  Lipid Panel:  No results found for: CHOL, TRIG, HDL, CHOLHDL, VLDL, LDLCALC  HgbA1C: No results found for: HGBA1C  Urine Drug Screen:  No results found for: LABOPIA, COCAINSCRNUR, LABBENZ, AMPHETMU, THCU, LABBARB  Alcohol Level: No results for input(s): ETH in the last 168 hours.   Imaging: DG Chest 2 View  Result Date: 08/06/2019 CLINICAL DATA:  Chest pain EXAM: CHEST - 2 VIEW COMPARISON:  05/19/2018 FINDINGS: Linear scarring or atelectasis at the left lung base. No consolidation or edema. No pleural effusion. No pneumothorax. Cardiomediastinal contours are within normal limits. No acute osseous abnormality. IMPRESSION: Linear scarring or atelectasis at the left lung base. Electronically Signed   By: Macy Mis M.D.   On: 08/06/2019 08:44   CT Head Wo Contrast  Result Date:  08/06/2019 CLINICAL DATA:  Neuro deficit with acute stroke suspected. EXAM: CT HEAD WITHOUT CONTRAST TECHNIQUE: Contiguous axial images were obtained from the base of the skull through the vertex without intravenous contrast. COMPARISON:  None. FINDINGS: Brain:  No evidence of acute infarction, hemorrhage, hydrocephalus, extra-axial collection or mass lesion/mass effect. Vascular: No hyperdense vessel or unexpected calcification. Skull: Normal. Negative for fracture or focal lesion. Sinuses/Orbits: No acute finding. IMPRESSION: No acute finding. Electronically Signed   By: Monte Fantasia M.D.   On: 08/06/2019 09:58     Assessment/Plan:  74 y.o. male with hx of COPD, HTN, GERD, HLD presents with 1 week hx of on and off symptoms that include SOB, generalized weakness. Patient states symptoms wax/wayne through the out the day. States symptoms fluctuates and last few minutes then resolve. Do not progress as the day progresses.   - symptoms fluctuate during the day. Does not sound progressive like Myasthenia. Does have reflexes - No lung CA so doesn't sound like Rita Ohara syndrome but last CT chest I see was 04/17/19 - needs neurology follow up as out patient potentially for EMG/NCS - CTH no acute abnormalities - No contraindication for steroids for COPD -  08/06/2019, 12:19 PM

## 2019-08-06 NOTE — ED Triage Notes (Signed)
Patient presents to the ED with tightness in his chest intermittently x 1 week with increased shortness of breath.  Patient states he has history of copd but the medication he has been taking is not helping as much as usual.  Patient also reports occasional nausea and a severe headache for the past 2 days.  Patient is alert and oriented x 4 and ambulatory with a steady gait.

## 2019-08-06 NOTE — ED Provider Notes (Signed)
[facility] Emergency Department Provider Note  ____________________________________________   First MD Initiated Contact with Patient 08/06/19 0848     (approximate)  I have reviewed the triage vital signs and the nursing notes.   HISTORY  Chief Complaint Chest Pain    HPI Donald Foster is a 74 y.o. male  Here with multiple complaints.   Pt's primary complaint is actually headache and fatigue. For the past few days, he has noticed progressively worsening generalized aching, throbbing headache. He's also noticed progressive, extreme fatigue. He states this seems to be a more ongoing issue and that it is associated with feeling OK in the morning then experiencing muscle weakness and extreme exhaustion as the day goes on. He endorses occasional double vision and difficulty holding things at the end of the day. No h/o myasthenia.   He also c/o a dull, chest pressure/pain that also feels like he has some fullness. No fever, chills. No sputum production. H/o COPD and he has been coughing more than usual, and wheezing. No COVID exposures.        Past Medical History:  Diagnosis Date  . COPD (chronic obstructive pulmonary disease) ([place])   . GERD (gastroesophageal reflux disease)   . Hypercholesteremia   . Hypertension     Patient Active Problem List   Diagnosis Date Noted  . Personal history of tobacco use, presenting hazards to health 04/17/2019  . Iron deficiency anemia   . Duodenal ulcer   . Anal lesion   . Benign non-nodular prostatic hyperplasia with lower urinary tract symptoms 08/12/2014    Past Surgical History:  Procedure Laterality Date  . CHOLECYSTECTOMY    . COLONOSCOPY WITH PROPOFOL N/A 08/17/2018   Procedure: COLONOSCOPY WITH PROPOFOL;  Surgeon: Lin Landsman, MD;  Location: [facility] ENDOSCOPY;  Service: Gastroenterology;  Laterality: N/A;  . ESOPHAGOGASTRODUODENOSCOPY (EGD) WITH PROPOFOL N/A 08/01/2018   Procedure:  ESOPHAGOGASTRODUODENOSCOPY (EGD) WITH PROPOFOL;  Surgeon: Lin Landsman, MD;  Location: [facility] ENDOSCOPY;  Service: Gastroenterology;  Laterality: N/A;  . HEMORRHOID SURGERY N/A 02/22/2019   Procedure: HEMORRHOIDECTOMY;  Surgeon: Jules Husbands, MD;  Location: [facility] ORS;  Service: General;  Laterality: N/A;  . UPPER GI ENDOSCOPY  08/17/2018   Procedure: UPPER GI ENDOSCOPY;  Surgeon: Lin Landsman, MD;  Location: [facility] ENDOSCOPY;  Service: Gastroenterology;;    Prior to Admission medications   Medication Sig Start Date End Date Taking? Authorizing Provider  albuterol (VENTOLIN HFA) 108 (90 Base) MCG/ACT inhaler Inhale 2 puffs into the lungs every 4 (four) hours as needed for wheezing or shortness of breath. Every 4-6 hrs prn    [provider]  aluminum hydroxide-magnesium carbonate (GAVISCON) 95-358 MG/15ML SUSP Take 15 mLs by mouth 4 (four) times daily - after meals and at bedtime. 05/19/18   [name], MD  amLODipine (NORVASC) 10 MG tablet Take by mouth daily.  05/03/18   [provider]  atorvastatin (LIPITOR) 20 MG tablet Take 20 mg by mouth at bedtime.  05/03/18   [provider]  Fluticasone-Salmeterol (ADVAIR) 100-50 MCG/DOSE AEPB Inhale 1 puff into the lungs 2 (two) times daily.    [provider]  losartan (COZAAR) 50 MG tablet Take 50 mg by mouth daily.  05/03/18   [provider]  Multiple Vitamin (MULTIVITAMIN WITH MINERALS) TABS tablet Take 1 tablet by mouth daily.    [provider]  omeprazole (PRILOSEC) 40 MG capsule Take 1 capsule (40 mg total) by mouth 2 (two) times daily  before a meal. 08/17/18 02/22/19  Vanga, Tally Due, MD  predniSONE (DELTASONE) 20 MG tablet Take 2 tablets (40 mg total) by mouth daily for 5 days. 08/06/19 08/11/19  Duffy Bruce, MD  SPIRIVA HANDIHALER 18 MCG inhalation capsule Place 18 mcg into inhaler and inhale daily.  05/05/18   [provider]  tamsulosin (FLOMAX) 0.4 MG CAPS  capsule Take 0.4 mg by mouth daily.  09/23/14   [provider]  traMADol (ULTRAM) 50 MG tablet Take 1 tablet (50 mg total) by mouth every 12 (twelve) hours as needed for severe pain. 08/06/19 08/05/20  Duffy Bruce, MD    Allergies Patient has no known allergies.  Family History  Problem Relation Age of Onset  . Cancer Father     Social History Social History   Tobacco Use  . Smoking status: Former Smoker    Packs/day: 0.75    Years: 57.00    Pack years: 42.75    Types: Cigarettes    Start date: 03/12/2018  . Smokeless tobacco: Never Used  Substance Use Topics  . Alcohol use: Not Currently  . Drug use: Not Currently    Review of Systems  Review of Systems  Constitutional: Positive for chills and fatigue. Negative for fever.  HENT: Negative for sore throat.   Respiratory: Positive for cough, shortness of breath and wheezing.   Cardiovascular: Negative for chest pain.  Gastrointestinal: Positive for nausea. Negative for abdominal pain.  Genitourinary: Negative for flank pain.  Musculoskeletal: Negative for neck pain.  Skin: Negative for rash and wound.  Allergic/Immunologic: Negative for immunocompromised state.  Neurological: Positive for weakness. Negative for numbness.  Hematological: Does not bruise/bleed easily.  All other systems reviewed and are negative.    ____________________________________________  PHYSICAL EXAM:      VITAL SIGNS: ED Triage Vitals  Enc Vitals Group     BP 08/06/19 0820 (!) 164/100     Pulse Rate 08/06/19 0820 95     Resp 08/06/19 0820 20     Temp 08/06/19 0820 98.5 F (36.9 C)     Temp Source 08/06/19 0820 Oral     SpO2 08/06/19 0820 96 %     Weight 08/06/19 0821 144 lb (65.3 kg)     Height 08/06/19 0821 5\' 3"  (1.6 m)     Head Circumference --      Peak Flow --      Pain Score 08/06/19 0818 8     Pain Loc --      Pain Edu? --      Excl. in [place]? --      Physical Exam Vitals and nursing note reviewed.    Constitutional:      General: He is not in acute distress.    Appearance: He is well-developed.  HENT:     Head: Normocephalic and atraumatic.  Eyes:     Conjunctiva/sclera: Conjunctivae normal.  Cardiovascular:     Rate and Rhythm: Normal rate and regular rhythm.     Heart sounds: Normal heart sounds.  Pulmonary:     Effort: Pulmonary effort is normal. No respiratory distress.     Breath sounds: Wheezing (moderate, expiratory) present.  Abdominal:     General: There is no distension.  Musculoskeletal:     Cervical back: Neck supple.  Skin:    General: Skin is warm.     Capillary Refill: Capillary refill takes less than 2 seconds.     Findings: No rash.  Neurological:     Mental  Status: He is alert and oriented to person, place, and time.     Motor: No abnormal muscle tone.     Comments: Neurological Exam:  Mental Status: Alert and oriented to person, place, and time. Attention and concentration normal. Speech clear. Recent memory is intact. Cranial Nerves: Visual fields grossly intact. EOMI and PERRLA. No nystagmus noted. Facial sensation intact at forehead, maxillary cheek, and chin/mandible bilaterally. No facial asymmetry or weakness. On prolonged upward gaze, however, pt has R>L ptosis noted. Hearing grossly normal. Uvula is midline, and palate elevates symmetrically. Normal SCM and trapezius strength. Tongue midline without fasciculations. Motor: Muscle strength 5/5 in proximal and distal UE and LE bilaterally. No pronator drift. Muscle tone normal. Sensation: Intact to light touch in upper and lower extremities distally bilaterally.  Gait: Normal without ataxia. Coordination: Normal FTN bilaterally.          ____________________________________________   LABS (all labs ordered are listed, but only abnormal results are displayed)  Labs Reviewed  BASIC METABOLIC PANEL - Abnormal; Notable for the following components:      Result Value   Potassium 3.1 (*)     Glucose, Bld 103 (*)    Creatinine, Ser 1.37 (*)    Calcium 8.7 (*)    GFR calc non Af Amer 52 (*)    GFR calc Af Amer 60 (*)    All other components within normal limits  CBC - Abnormal; Notable for the following components:   WBC 3.3 (*)    Hemoglobin 12.9 (*)    MCV 74.7 (*)    MCH 24.0 (*)    RDW 17.2 (*)    All other components within normal limits  BLOOD GAS, VENOUS - Abnormal; Notable for the following components:   pH, Ven 7.44 (*)    pCO2, Ven 42 (*)    pO2, Ven 52.0 (*)    Bicarbonate 28.5 (*)    Acid-Base Excess 3.9 (*)    All other components within normal limits  SARS CORONAVIRUS 2 (TAT 6-24 HRS)  MAGNESIUM  TROPONIN I (HIGH SENSITIVITY)  TROPONIN I (HIGH SENSITIVITY)    ____________________________________________  EKG: Normal sinus rhythm, VR 98. PR 168, QRS 84, qTc 485. No acute ischemic changes. Rate increased compared to prior. No ST elevations or depressions. ________________________________________  RADIOLOGY All imaging, including plain films, CT scans, and ultrasounds, independently reviewed by me, and interpretations confirmed via formal radiology reads.  ED MD interpretation:   CXR: Clear CT Head: [place]  Official radiology report(s): DG Chest 2 View  Result Date: 08/06/2019 CLINICAL DATA:  Chest pain EXAM: CHEST - 2 VIEW COMPARISON:  05/19/2018 FINDINGS: Linear scarring or atelectasis at the left lung base. No consolidation or edema. No pleural effusion. No pneumothorax. Cardiomediastinal contours are within normal limits. No acute osseous abnormality. IMPRESSION: Linear scarring or atelectasis at the left lung base. Electronically Signed   By: Macy Mis M.D.   On: 08/06/2019 08:44   CT Head Wo Contrast  Result Date: 08/06/2019 CLINICAL DATA:  Neuro deficit with acute stroke suspected. EXAM: CT HEAD WITHOUT CONTRAST TECHNIQUE: Contiguous axial images were obtained from the base of the skull through the vertex without intravenous contrast.  COMPARISON:  None. FINDINGS: Brain: No evidence of acute infarction, hemorrhage, hydrocephalus, extra-axial collection or mass lesion/mass effect. Vascular: No hyperdense vessel or unexpected calcification. Skull: Normal. Negative for fracture or focal lesion. Sinuses/Orbits: No acute finding. IMPRESSION: No acute finding. Electronically Signed   By: Monte Fantasia M.D.   On:  08/06/2019 09:58    ____________________________________________  PROCEDURES   Procedure(s) performed (including Critical Care):  Procedures  ____________________________________________  INITIAL IMPRESSION / MDM / [place] / ED COURSE  As part of my medical decision making, I reviewed the following data within the [place] notes reviewed and incorporated, Old chart reviewed, Notes from prior ED visits, and [place] Controlled Substance Database       *Donald Foster was evaluated in Emergency Department on 08/06/2019 for the symptoms described in the history of present illness. He was evaluated in the context of the global COVID-19 pandemic, which necessitated consideration that the patient might be at risk for infection with the SARS-CoV-2 virus that causes COVID-19. Institutional protocols and algorithms that pertain to the evaluation of patients at risk for COVID-19 are in a state of rapid change based on information released by regulatory bodies including the CDC and federal and state organizations. These policies and algorithms were followed during the patient's care in the ED.  Some ED evaluations and interventions may be delayed as a result of limited staffing during the pandemic.*     Medical Decision Making:  74 yo M here with multiple, somewhat atypical complaints. Pt's primary complaint seems to be worsening headaches and muscular weakness. From a HA perspective, no fever, no focal findings, CT head neg and he has no red flags to suggest CVA, mass lesion, meningitis, ICH.  His weakness, however, is somewhat concerning for a NM disorder given ptosis on exam. I asked Dr. Irish Elders to evaluate who is in agreement and recommends COVID testing and outpatient referral. No indication for emergent MRI/admission currently. Otherwise, his labs are very reassuring. I suspect his chest pain/tightness is 2/2 COPD and he has normal EKG with trop neg x 2 - doubt ACS, PE, dissection. Will tx for COPD, refer to Neurology fo rhis ongoing complaints.  ____________________________________________  FINAL CLINICAL IMPRESSION(S) / ED DIAGNOSES  Final diagnoses:  Atypical chest pain  Acute non intractable tension-type headache  COPD exacerbation ([place])     MEDICATIONS GIVEN DURING THIS VISIT:  Medications  potassium chloride SA (KLOR-CON) CR tablet 40 mEq (40 mEq Oral Given 08/06/19 0937)  acetaminophen (TYLENOL) tablet 1,000 mg (1,000 mg Oral Given 08/06/19 0937)  ipratropium-albuterol (DUONEB) 0.5-2.5 (3) MG/3ML nebulizer solution 3 mL (3 mLs Nebulization Given 08/06/19 0938)  predniSONE (DELTASONE) tablet 60 mg (60 mg Oral Given 08/06/19 1201)  ipratropium-albuterol (DUONEB) 0.5-2.5 (3) MG/3ML nebulizer solution 3 mL (3 mLs Nebulization Given 08/06/19 1202)  prochlorperazine (COMPAZINE) injection 10 mg (10 mg Intravenous Given 08/06/19 1323)  diphenhydrAMINE (BENADRYL) injection 25 mg (25 mg Intravenous Given 08/06/19 1322)  ketorolac (TORADOL) 30 MG/ML injection 15 mg (15 mg Intravenous Given 08/06/19 1322)     ED Discharge Orders         Ordered    predniSONE (DELTASONE) 20 MG tablet  Daily     08/06/19 1350    traMADol (ULTRAM) 50 MG tablet  Every 12 hours PRN     08/06/19 1350           Note:  This document was prepared using Dragon voice recognition software and may include unintentional dictation errors.   Duffy Bruce, MD 08/06/19 [phone]

## 2019-08-06 NOTE — Discharge Instructions (Signed)
For your headaches: Try tylenol as needed I've prescribed Ultram, a stronger medicine, to take if needed  For your shortness of breath/weakness: Take the steroids as prescribed Follow-up with your doctor Use your albuterol inhaler

## 2019-08-07 LAB — SARS CORONAVIRUS 2 (TAT 6-24 HRS): SARS Coronavirus 2: NEGATIVE

## 2019-09-25 DIAGNOSIS — R519 Headache, unspecified: Secondary | ICD-10-CM | POA: Insufficient documentation

## 2020-01-14 ENCOUNTER — Inpatient Hospital Stay
Admission: EM | Admit: 2020-01-14 | Discharge: 2020-01-19 | DRG: 683 | Disposition: A | Discharge status: Home / Self Care | Payer: Medicare HMO | Attending: Hospitalist | Admitting: Hospitalist

## 2020-01-14 ENCOUNTER — Inpatient Hospital Stay: Payer: Medicare HMO

## 2020-01-14 ENCOUNTER — Other Ambulatory Visit: Payer: Self-pay

## 2020-01-14 ENCOUNTER — Encounter: Payer: Self-pay | Admitting: Emergency Medicine

## 2020-01-14 DIAGNOSIS — N1831 Chronic kidney disease, stage 3a: Secondary | ICD-10-CM | POA: Diagnosis present

## 2020-01-14 DIAGNOSIS — N3289 Other specified disorders of bladder: Secondary | ICD-10-CM | POA: Diagnosis present

## 2020-01-14 DIAGNOSIS — K5900 Constipation, unspecified: Secondary | ICD-10-CM | POA: Diagnosis present

## 2020-01-14 DIAGNOSIS — N3001 Acute cystitis with hematuria: Secondary | ICD-10-CM

## 2020-01-14 DIAGNOSIS — N179 Acute kidney failure, unspecified: Principal | ICD-10-CM

## 2020-01-14 DIAGNOSIS — Z9049 Acquired absence of other specified parts of digestive tract: Secondary | ICD-10-CM | POA: Diagnosis not present

## 2020-01-14 DIAGNOSIS — R339 Retention of urine, unspecified: Secondary | ICD-10-CM

## 2020-01-14 DIAGNOSIS — T859XXA Unspecified complication of internal prosthetic device, implant and graft, initial encounter: Secondary | ICD-10-CM | POA: Diagnosis present

## 2020-01-14 DIAGNOSIS — Z20822 Contact with and (suspected) exposure to covid-19: Secondary | ICD-10-CM | POA: Diagnosis present

## 2020-01-14 DIAGNOSIS — N400 Enlarged prostate without lower urinary tract symptoms: Secondary | ICD-10-CM | POA: Diagnosis present

## 2020-01-14 DIAGNOSIS — K219 Gastro-esophageal reflux disease without esophagitis: Secondary | ICD-10-CM | POA: Diagnosis present

## 2020-01-14 DIAGNOSIS — N138 Other obstructive and reflux uropathy: Secondary | ICD-10-CM | POA: Diagnosis present

## 2020-01-14 DIAGNOSIS — J449 Chronic obstructive pulmonary disease, unspecified: Secondary | ICD-10-CM | POA: Diagnosis present

## 2020-01-14 DIAGNOSIS — R000 Tachycardia, unspecified: Secondary | ICD-10-CM

## 2020-01-14 DIAGNOSIS — Z87891 Personal history of nicotine dependence: Secondary | ICD-10-CM | POA: Diagnosis not present

## 2020-01-14 DIAGNOSIS — R310 Gross hematuria: Secondary | ICD-10-CM | POA: Diagnosis present

## 2020-01-14 DIAGNOSIS — N1330 Unspecified hydronephrosis: Secondary | ICD-10-CM | POA: Diagnosis present

## 2020-01-14 DIAGNOSIS — I129 Hypertensive chronic kidney disease with stage 1 through stage 4 chronic kidney disease, or unspecified chronic kidney disease: Secondary | ICD-10-CM | POA: Diagnosis present

## 2020-01-14 DIAGNOSIS — E7800 Pure hypercholesterolemia, unspecified: Secondary | ICD-10-CM | POA: Diagnosis present

## 2020-01-14 DIAGNOSIS — N401 Enlarged prostate with lower urinary tract symptoms: Secondary | ICD-10-CM | POA: Diagnosis present

## 2020-01-14 DIAGNOSIS — E785 Hyperlipidemia, unspecified: Secondary | ICD-10-CM | POA: Diagnosis present

## 2020-01-14 DIAGNOSIS — N390 Urinary tract infection, site not specified: Secondary | ICD-10-CM

## 2020-01-14 DIAGNOSIS — R338 Other retention of urine: Secondary | ICD-10-CM | POA: Diagnosis present

## 2020-01-14 DIAGNOSIS — Y846 Urinary catheterization as the cause of abnormal reaction of the patient, or of later complication, without mention of misadventure at the time of the procedure: Secondary | ICD-10-CM | POA: Diagnosis present

## 2020-01-14 DIAGNOSIS — R110 Nausea: Secondary | ICD-10-CM | POA: Diagnosis not present

## 2020-01-14 HISTORY — DX: Acute kidney failure, unspecified: N17.9

## 2020-01-14 LAB — CBC
HCT: 33.6 % — ABNORMAL LOW (ref 39.0–52.0)
HCT: 36.9 % — ABNORMAL LOW (ref 39.0–52.0)
Hemoglobin: 10.9 g/dL — ABNORMAL LOW (ref 13.0–17.0)
Hemoglobin: 12.7 g/dL — ABNORMAL LOW (ref 13.0–17.0)
MCH: 23.7 pg — ABNORMAL LOW (ref 26.0–34.0)
MCH: 24.2 pg — ABNORMAL LOW (ref 26.0–34.0)
MCHC: 32.4 g/dL (ref 30.0–36.0)
MCHC: 34.4 g/dL (ref 30.0–36.0)
MCV: 73 fL — ABNORMAL LOW (ref 80.0–100.0)
MCV: 70.4 fL — ABNORMAL LOW (ref 80.0–100.0)
Platelets: 188 K/uL (ref 150–400)
Platelets: 227 10*3/uL (ref 150–400)
RBC: 4.6 MIL/uL (ref 4.22–5.81)
RBC: 5.24 MIL/uL (ref 4.22–5.81)
RDW: 18.3 % — ABNORMAL HIGH (ref 11.5–15.5)
RDW: 17.7 % — ABNORMAL HIGH (ref 11.5–15.5)
WBC: 3.8 K/uL — ABNORMAL LOW (ref 4.0–10.5)
WBC: 7 10*3/uL (ref 4.0–10.5)
nRBC: 0 % (ref 0.0–0.2)
nRBC: 0 % (ref 0.0–0.2)

## 2020-01-14 LAB — URINALYSIS, COMPLETE (UACMP) WITH MICROSCOPIC
Bacteria, UA: NONE SEEN
RBC / HPF: >50 RBC/hpf — ABNORMAL HIGH (ref 0–5)
Specific Gravity, Urine: 1.012 (ref 1.005–1.030)
Squamous Epithelial / HPF: NONE SEEN (ref 0–5)

## 2020-01-14 LAB — COMPREHENSIVE METABOLIC PANEL WITH GFR
ALT: 15 U/L (ref 0–44)
AST: 20 U/L (ref 15–41)
Albumin: 3.8 g/dL (ref 3.5–5.0)
Alkaline Phosphatase: 63 U/L (ref 38–126)
Anion gap: 8 (ref 5–15)
BUN: 58 mg/dL — ABNORMAL HIGH (ref 8–23)
CO2: 18 mmol/L — ABNORMAL LOW (ref 22–32)
Calcium: 7.5 mg/dL — ABNORMAL LOW (ref 8.9–10.3)
Chloride: 114 mmol/L — ABNORMAL HIGH (ref 98–111)
Creatinine, Ser: 6.07 mg/dL — ABNORMAL HIGH (ref 0.61–1.24)
GFR calc Af Amer: 10 mL/min — ABNORMAL LOW
GFR calc non Af Amer: 8 mL/min — ABNORMAL LOW
Glucose, Bld: 78 mg/dL (ref 70–99)
Potassium: 4.5 mmol/L (ref 3.5–5.1)
Sodium: 140 mmol/L (ref 135–145)
Total Bilirubin: 0.6 mg/dL (ref 0.3–1.2)
Total Protein: 7.7 g/dL (ref 6.5–8.1)

## 2020-01-14 LAB — URINALYSIS, ROUTINE W REFLEX MICROSCOPIC
Bilirubin Urine: NEGATIVE
Glucose, UA: NEGATIVE mg/dL
Hgb urine dipstick: NEGATIVE
Ketones, ur: NEGATIVE mg/dL
Leukocytes,Ua: NEGATIVE
Nitrite: NEGATIVE
Protein, ur: NEGATIVE mg/dL
Specific Gravity, Urine: 1.006 (ref 1.005–1.030)
pH: 5 (ref 5.0–8.0)

## 2020-01-14 LAB — MAGNESIUM: Magnesium: 1 mg/dL — ABNORMAL LOW (ref 1.7–2.4)

## 2020-01-14 LAB — SARS CORONAVIRUS 2 BY RT PCR (HOSPITAL ORDER, PERFORMED IN ~~LOC~~ HOSPITAL LAB): SARS Coronavirus 2: NEGATIVE

## 2020-01-14 MED ORDER — PHENAZOPYRIDINE HCL 100 MG PO TABS
100.0000 mg | ORAL_TABLET | Freq: Three times a day (TID) | ORAL | Status: DC
  Administered 2020-01-14 – 2020-01-15 (×3): 100 mg via ORAL
  Filled 2020-01-14 (×4): qty 1

## 2020-01-14 MED ORDER — HEPARIN SODIUM (PORCINE) 5000 UNIT/ML IJ SOLN
5000.0000 [IU] | Freq: Three times a day (TID) | INTRAMUSCULAR | Status: DC
  Administered 2020-01-14 – 2020-01-17 (×9): 5000 [IU] via SUBCUTANEOUS
  Filled 2020-01-14 (×8): qty 1

## 2020-01-14 MED ORDER — AMLODIPINE BESYLATE 10 MG PO TABS
10.0000 mg | ORAL_TABLET | Freq: Every day | ORAL | Status: DC
  Administered 2020-01-14 – 2020-01-19 (×6): 10 mg via ORAL
  Filled 2020-01-14 (×2): qty 1
  Filled 2020-01-14: qty 2
  Filled 2020-01-14 (×3): qty 1

## 2020-01-14 MED ORDER — TRAMADOL HCL 50 MG PO TABS
50.0000 mg | ORAL_TABLET | Freq: Two times a day (BID) | ORAL | Status: DC | PRN
  Administered 2020-01-14 – 2020-01-16 (×3): 50 mg via ORAL
  Filled 2020-01-14 (×3): qty 1

## 2020-01-14 MED ORDER — FLUTICASONE FUROATE-VILANTEROL 100-25 MCG/INH IN AEPB
1.0000 | INHALATION_SPRAY | Freq: Every day | RESPIRATORY_TRACT | Status: DC
  Administered 2020-01-15 – 2020-01-19 (×4): 1 via RESPIRATORY_TRACT
  Filled 2020-01-14 (×2): qty 28

## 2020-01-14 MED ORDER — ATORVASTATIN CALCIUM 20 MG PO TABS
20.0000 mg | ORAL_TABLET | Freq: Every day | ORAL | Status: DC
  Administered 2020-01-14 – 2020-01-18 (×5): 20 mg via ORAL
  Filled 2020-01-14 (×5): qty 1

## 2020-01-14 MED ORDER — ALBUTEROL SULFATE (2.5 MG/3ML) 0.083% IN NEBU: 3.0000 mL | INHALATION_SOLUTION | RESPIRATORY_TRACT | Status: DC | PRN

## 2020-01-14 MED ORDER — TIOTROPIUM BROMIDE MONOHYDRATE 18 MCG IN CAPS
18.0000 ug | ORAL_CAPSULE | Freq: Every day | RESPIRATORY_TRACT | Status: DC
  Administered 2020-01-15 – 2020-01-19 (×4): 18 ug via RESPIRATORY_TRACT
  Filled 2020-01-14 (×2): qty 5

## 2020-01-14 MED ORDER — SODIUM CHLORIDE 0.9 % IV SOLN
1.0000 g | INTRAVENOUS | Status: DC
  Administered 2020-01-15: 1 g via INTRAVENOUS
  Filled 2020-01-14 (×2): qty 10

## 2020-01-14 MED ORDER — ONDANSETRON HCL 4 MG/2ML IJ SOLN
4.0000 mg | Freq: Once | INTRAMUSCULAR | Status: AC
  Administered 2020-01-14: 4 mg via INTRAVENOUS
  Filled 2020-01-14: qty 2

## 2020-01-14 MED ORDER — FENTANYL CITRATE (PF) 100 MCG/2ML IJ SOLN
50.0000 ug | Freq: Once | INTRAMUSCULAR | Status: AC
  Administered 2020-01-14: 50 ug via INTRAVENOUS
  Filled 2020-01-14: qty 2

## 2020-01-14 MED ORDER — PANTOPRAZOLE SODIUM 40 MG PO TBEC
40.0000 mg | DELAYED_RELEASE_TABLET | Freq: Every day | ORAL | Status: DC
  Administered 2020-01-14 – 2020-01-19 (×6): 40 mg via ORAL
  Filled 2020-01-14 (×6): qty 1

## 2020-01-14 MED ORDER — TAMSULOSIN HCL 0.4 MG PO CAPS
0.4000 mg | ORAL_CAPSULE | Freq: Every day | ORAL | Status: DC
  Administered 2020-01-14 – 2020-01-19 (×6): 0.4 mg via ORAL
  Filled 2020-01-14 (×6): qty 1

## 2020-01-14 MED ORDER — LACTATED RINGERS IV SOLN: INTRAVENOUS | Status: DC

## 2020-01-14 MED ORDER — SODIUM CHLORIDE 0.9 % IV SOLN
1.0000 g | Freq: Once | INTRAVENOUS | Status: AC
  Administered 2020-01-14: 1 g via INTRAVENOUS
  Filled 2020-01-14: qty 10

## 2020-01-14 NOTE — H&P (Signed)
Donald Foster is an 74 y.o. male.    Status is: Inpatient  Remains inpatient appropriate because:Inpatient level of care appropriate due to severity of illness   Dispo: The patient is from: Home              Anticipated d/c is to: home              Anticipated d/c date is: 2 days              Patient currently is not medically stable to d/c.   Chief Complaint: Dysuria HPI: Patient is a 74 year old male with history of COPD, benign prostate hypertrophy, dyslipidemia and essential hypertension who present to the hospital with dysuria and urinary retention.  Upon arrival to the emergency room, he was found to have urinary retention, Foley catheter was anchored, drained 1 L of urine, initially with clear then become bloody.  He was placed on Rocephin and a urine culture was sent out.  His creatinine was 6.07, WBC 3.8, hemoglobin 10.9.  Patient currently lives in the home, he was quite healthy.  He has difficulty urination and a incontinent of urine for about 1 week.  He just feels he cannot empty his bladder.  He also complaining of some lower abdominal pain since yesterday.  He denies any fever or chills, he was nauseated, but no vomiting.    Past Medical History:  Diagnosis Date  . AKI (acute kidney injury) ([place]) 01/14/2020  . COPD (chronic obstructive pulmonary disease) ([place])   . GERD (gastroesophageal reflux disease)   . Hypercholesteremia   . Hypertension     Past Surgical History:  Procedure Laterality Date  . CHOLECYSTECTOMY    . COLONOSCOPY WITH PROPOFOL N/A 08/17/2018   Procedure: COLONOSCOPY WITH PROPOFOL;  Surgeon: Lin Landsman, MD;  Location: [facility] ENDOSCOPY;  Service: Gastroenterology;  Laterality: N/A;  . ESOPHAGOGASTRODUODENOSCOPY (EGD) WITH PROPOFOL N/A 08/01/2018   Procedure: ESOPHAGOGASTRODUODENOSCOPY (EGD) WITH PROPOFOL;  Surgeon: Lin Landsman, MD;  Location: [facility] ENDOSCOPY;  Service: Gastroenterology;  Laterality: N/A;  . HEMORRHOID SURGERY N/A  02/22/2019   Procedure: HEMORRHOIDECTOMY;  Surgeon: Jules Husbands, MD;  Location: [facility] ORS;  Service: General;  Laterality: N/A;  . UPPER GI ENDOSCOPY  08/17/2018   Procedure: UPPER GI ENDOSCOPY;  Surgeon: Lin Landsman, MD;  Location: [facility] ENDOSCOPY;  Service: Gastroenterology;;    Family History  Problem Relation Age of Onset  . Cancer Father    Social History:  reports that he has quit smoking. His smoking use included cigarettes. He started smoking about 22 months ago. He has a 42.75 pack-year smoking history. He has never used smokeless tobacco. He reports previous alcohol use. He reports previous drug use.  Allergies: No Known Allergies  (Not in a hospital admission)   Results for orders placed or performed during the hospital encounter of 01/14/20 (from the past 48 hour(s))  Comprehensive metabolic panel     Status: Abnormal   Collection Time: 01/14/20  9:50 AM  Result Value Ref Range   Sodium 140 135 - 145 mmol/L   Potassium 4.5 3.5 - 5.1 mmol/L   Chloride 114 (H) 98 - 111 mmol/L   CO2 18 (L) 22 - 32 mmol/L   Glucose, Bld 78 70 - 99 mg/dL    Comment: Glucose reference range applies only to samples taken after fasting for at least 8 hours.   BUN 58 (H) 8 - 23 mg/dL   Creatinine, Ser 6.07 (H) 0.61 - 1.24 mg/dL  Calcium 7.5 (L) 8.9 - 10.3 mg/dL   Total Protein 7.7 6.5 - 8.1 g/dL   Albumin 3.8 3.5 - 5.0 g/dL   AST 20 15 - 41 U/L   ALT 15 0 - 44 U/L   Alkaline Phosphatase 63 38 - 126 U/L   Total Bilirubin 0.6 0.3 - 1.2 mg/dL   GFR calc non Af Amer 8 (L) >60 mL/min   GFR calc Af Amer 10 (L) >60 mL/min   Anion gap 8 5 - 15    Comment: Performed at [facility], [address] [place], [place] [address]  CBC     Status: Abnormal   Collection Time: 01/14/20  9:50 AM  Result Value Ref Range   WBC 3.8 (L) 4.0 - 10.5 K/uL   RBC 4.60 4.22 - 5.81 MIL/uL   Hemoglobin 10.9 (L) 13.0 - 17.0 g/dL   HCT 33.6 (L) 39 - 52 %   MCV 73.0 (L) 80.0 - 100.0 fL   MCH 23.7  (L) 26.0 - 34.0 pg   MCHC 32.4 30.0 - 36.0 g/dL   RDW 18.3 (H) 11.5 - 15.5 %   Platelets 188 150 - 400 K/uL   nRBC 0.0 0.0 - 0.2 %    Comment: Performed at [facility], [address] [place], [place] [address]  Urinalysis, Routine w reflex microscopic     Status: Abnormal   Collection Time: 01/14/20  9:50 AM  Result Value Ref Range   Color, Urine STRAW (A) YELLOW   APPearance CLEAR (A) CLEAR   Specific Gravity, Urine 1.006 1.005 - 1.030   pH 5.0 5.0 - 8.0   Glucose, UA NEGATIVE NEGATIVE mg/dL   Hgb urine dipstick NEGATIVE NEGATIVE   Bilirubin Urine NEGATIVE NEGATIVE   Ketones, ur NEGATIVE NEGATIVE mg/dL   Protein, ur NEGATIVE NEGATIVE mg/dL   Nitrite NEGATIVE NEGATIVE   Leukocytes,Ua NEGATIVE NEGATIVE    Comment: Performed at [facility], [address] [place], [place] [address]  Urinalysis, Complete w Microscopic     Status: Abnormal   Collection Time: 01/14/20 12:31 PM  Result Value Ref Range   Color, Urine RED (A) YELLOW   APPearance CLOUDY (A) CLEAR   Specific Gravity, Urine 1.012 1.005 - 1.030   pH  5.0 - 8.0    TEST NOT REPORTED DUE TO COLOR INTERFERENCE OF URINE PIGMENT   Glucose, UA (A) NEGATIVE mg/dL    TEST NOT REPORTED DUE TO COLOR INTERFERENCE OF URINE PIGMENT   Hgb urine dipstick (A) NEGATIVE    TEST NOT REPORTED DUE TO COLOR INTERFERENCE OF URINE PIGMENT   Bilirubin Urine (A) NEGATIVE    TEST NOT REPORTED DUE TO COLOR INTERFERENCE OF URINE PIGMENT   Ketones, ur (A) NEGATIVE mg/dL    TEST NOT REPORTED DUE TO COLOR INTERFERENCE OF URINE PIGMENT   Protein, ur (A) NEGATIVE mg/dL    TEST NOT REPORTED DUE TO COLOR INTERFERENCE OF URINE PIGMENT   Nitrite (A) NEGATIVE    TEST NOT REPORTED DUE TO COLOR INTERFERENCE OF URINE PIGMENT   Leukocytes,Ua (A) NEGATIVE    TEST NOT REPORTED DUE TO COLOR INTERFERENCE OF URINE PIGMENT   RBC / HPF >50 (H) 0 - 5 RBC/hpf   WBC, UA 11-20 0 - 5 WBC/hpf   Bacteria, UA NONE SEEN NONE SEEN   Squamous  Epithelial / LPF NONE SEEN 0 - 5    Comment: Performed at [facility], [address] [place], [place] [address]  SARS Coronavirus  2 by RT PCR (hospital order, performed in [facility] hospital lab) Nasopharyngeal Nasopharyngeal Swab     Status: None   Collection Time: 01/14/20 12:31 PM   Specimen: Nasopharyngeal Swab  Result Value Ref Range   SARS Coronavirus 2 NEGATIVE NEGATIVE    Comment: (NOTE) SARS-CoV-2 target nucleic acids are NOT DETECTED.  The SARS-CoV-2 RNA is generally detectable in upper and lower respiratory specimens during the acute phase of infection. The lowest concentration of SARS-CoV-2 viral copies this assay can detect is 250 copies / mL. A negative result does not preclude SARS-CoV-2 infection and should not be used as the sole basis for treatment or other patient management decisions.  A negative result may occur with improper specimen collection / handling, submission of specimen other than nasopharyngeal swab, presence of viral mutation(s) within the areas targeted by this assay, and inadequate number of viral copies (<250 copies / mL). A negative result must be combined with clinical observations, patient history, and epidemiological information.  Fact Sheet for Patients:   StrictlyIdeas.no  Fact Sheet for Healthcare Providers: BankingDealers.co.za  This test is not yet approved or  cleared by the [place] FDA and has been authorized for detection and/or diagnosis of SARS-CoV-2 by FDA under an Emergency Use Authorization (EUA).  This EUA will remain in effect (meaning this test can be used) for the duration of the COVID-19 declaration under Section 564(b)(1) of the Act, 21 U.S.C. section 360bbb-3(b)(1), unless the authorization is terminated or revoked sooner.  Performed at [facility], [address] [place], [place] [address]    No results found.  Review of Systems   Constitutional: Negative for activity change, appetite change, chills, diaphoresis, fever and unexpected weight change.  HENT: Negative for nosebleeds, postnasal drip and rhinorrhea.   Eyes: Negative for discharge and itching.  Respiratory: Positive for cough and shortness of breath. Negative for choking.   Cardiovascular: Negative for chest pain, palpitations and leg swelling.  Gastrointestinal: Positive for abdominal pain and constipation. Negative for abdominal distention, diarrhea and nausea.  Endocrine: Negative for cold intolerance and heat intolerance.  Genitourinary: Positive for decreased urine volume, dysuria, hematuria and urgency. Negative for flank pain.  Musculoskeletal: Negative for back pain and joint swelling.  Allergic/Immunologic: Negative for environmental allergies and food allergies.  Neurological: Positive for dizziness, light-headedness and headaches.  Psychiatric/Behavioral: Negative for confusion and hallucinations.    Blood pressure (!) 174/80, pulse 94, temperature 99.1 F (37.3 C), temperature source Oral, resp. rate 18, height 5\' 3"  (1.6 m), weight 65.3 kg, SpO2 100 %. Physical Exam Constitutional:      General: He is not in acute distress.    Appearance: He is not ill-appearing or toxic-appearing.  HENT:     Head: Normocephalic and atraumatic.     Nose: Nose normal. No congestion.     Mouth/Throat:     Mouth: Mucous membranes are moist.     Pharynx: Oropharynx is clear.  Eyes:     Extraocular Movements: Extraocular movements intact.     Conjunctiva/sclera: Conjunctivae normal.     Pupils: Pupils are equal, round, and reactive to light.  Cardiovascular:     Rate and Rhythm: Normal rate and regular rhythm.     Heart sounds: No murmur heard.  No gallop.   Pulmonary:     Effort: Pulmonary effort is normal. No respiratory distress.     Breath sounds: Normal breath sounds. No wheezing or rales.  Abdominal:     General: Abdomen is flat. Bowel  sounds are  normal. There is no distension.     Palpations: Abdomen is soft.     Tenderness: There is abdominal tenderness.     Comments: Lower abdomen tenderness without rebound tenderness.  Musculoskeletal:        General: Normal range of motion.     Cervical back: Normal range of motion and neck supple. No rigidity or tenderness.     Right lower leg: No edema.     Left lower leg: No edema.  Lymphadenopathy:     Cervical: No cervical adenopathy.  Skin:    General: Skin is warm and dry.     Coloration: Skin is not jaundiced.  Neurological:     General: No focal deficit present.     Mental Status: He is alert and oriented to person, place, and time.     Cranial Nerves: No cranial nerve deficit.  Psychiatric:        Mood and Affect: Mood normal.        Behavior: Behavior normal.      Assessment/Plan #1.  Acute renal failure secondary to obstruction secondary to benign prostate hypertrophy. Patient has urinary tension for the last week.  Probably from enlarged prostate.  Continue home Flomax.  Start IV fluids.  Continue monitor renal function.  2.  Urinary retention secondary to benign prostate hypertrophy. Foley catheter was anchored, will continue that.  Will obtain urology consult in the morning.  3.  Urinary tract infection with hematuria. Secondary to obstruction. Continue Rocephin, will urine culture results.  4.  COPD. Stable, no exacerbation.  No hypoxemia.  5.  Essential hypertension. Continue amlodipine, hold losartan for acute renal failure.  6.  Prophylaxis. We will use heparin subcu.  Sharen Hones, MD 01/14/2020, 3:04 PM

## 2020-01-14 NOTE — ED Provider Notes (Signed)
[facility] Emergency Department Provider Note   ____________________________________________   First MD Initiated Contact with Patient 01/14/20 1141     (approximate)  I have reviewed the triage vital signs and the nursing notes.   HISTORY  Chief Complaint Dysuria    HPI Donald Foster is a 74 y.o. male who reports weak stream and suprapubic pain with some dysuria for about a week.  He had a Foley placed and triage clear urine was drained then is turned red now.  Patient continues to have suprapubic pain.  He reports chills but no fever.  Foley was placed because after voiding he had 1 L residual amount of urine remaining.  Suprapubic pain is coming and going in waves like if he was having bladder spasms.         Past Medical History:  Diagnosis Date  . COPD (chronic obstructive pulmonary disease) ([place])   . GERD (gastroesophageal reflux disease)   . Hypercholesteremia   . Hypertension     Patient Active Problem List   Diagnosis Date Noted  . Personal history of tobacco use, presenting hazards to health 04/17/2019  . Iron deficiency anemia   . Duodenal ulcer   . Anal lesion   . Benign non-nodular prostatic hyperplasia with lower urinary tract symptoms 08/12/2014    Past Surgical History:  Procedure Laterality Date  . CHOLECYSTECTOMY    . COLONOSCOPY WITH PROPOFOL N/A 08/17/2018   Procedure: COLONOSCOPY WITH PROPOFOL;  Surgeon: Lin Landsman, MD;  Location: [facility] ENDOSCOPY;  Service: Gastroenterology;  Laterality: N/A;  . ESOPHAGOGASTRODUODENOSCOPY (EGD) WITH PROPOFOL N/A 08/01/2018   Procedure: ESOPHAGOGASTRODUODENOSCOPY (EGD) WITH PROPOFOL;  Surgeon: Lin Landsman, MD;  Location: [facility] ENDOSCOPY;  Service: Gastroenterology;  Laterality: N/A;  . HEMORRHOID SURGERY N/A 02/22/2019   Procedure: HEMORRHOIDECTOMY;  Surgeon: Jules Husbands, MD;  Location: [facility] ORS;  Service: General;  Laterality: N/A;  . UPPER GI ENDOSCOPY  08/17/2018    Procedure: UPPER GI ENDOSCOPY;  Surgeon: Lin Landsman, MD;  Location: [facility] ENDOSCOPY;  Service: Gastroenterology;;    Prior to Admission medications   Medication Sig Start Date End Date Taking? Authorizing Provider  albuterol (VENTOLIN HFA) 108 (90 Base) MCG/ACT inhaler Inhale 2 puffs into the lungs every 4 (four) hours as needed for wheezing or shortness of breath. Every 4-6 hrs prn    [provider]  aluminum hydroxide-magnesium carbonate (GAVISCON) 95-358 MG/15ML SUSP Take 15 mLs by mouth 4 (four) times daily - after meals and at bedtime. 05/19/18   [name], MD  amLODipine (NORVASC) 10 MG tablet Take by mouth daily.  05/03/18   [provider]  atorvastatin (LIPITOR) 20 MG tablet Take 20 mg by mouth at bedtime.  05/03/18   [provider]  Fluticasone-Salmeterol (ADVAIR) 100-50 MCG/DOSE AEPB Inhale 1 puff into the lungs 2 (two) times daily.    [provider]  losartan (COZAAR) 50 MG tablet Take 50 mg by mouth daily.  05/03/18   [provider]  Multiple Vitamin (MULTIVITAMIN WITH MINERALS) TABS tablet Take 1 tablet by mouth daily.    [provider]  omeprazole (PRILOSEC) 40 MG capsule Take 1 capsule (40 mg total) by mouth 2 (two) times daily before a meal. 08/17/18 02/22/19  Vanga, Tally Due, MD  SPIRIVA HANDIHALER 18 MCG inhalation capsule Place 18 mcg into inhaler and inhale daily.  05/05/18   [provider]  tamsulosin (FLOMAX) 0.4 MG CAPS capsule Take 0.4 mg by mouth daily.  09/23/14   [provider]  traMADol (ULTRAM) 50 MG tablet Take 1 tablet (50 mg total) by mouth every 12 (twelve) hours as needed for severe pain. 08/06/19 08/05/20  Duffy Bruce, MD    Allergies Patient has no known allergies.  Family History  Problem Relation Age of Onset  . Cancer Father     Social History Social History   Tobacco Use  . Smoking status: Former Smoker    Packs/day: 0.75    Years: 57.00    Pack  years: 42.75    Types: Cigarettes    Start date: 03/12/2018  . Smokeless tobacco: Never Used  Vaping Use  . Vaping Use: Former  Substance Use Topics  . Alcohol use: Not Currently  . Drug use: Not Currently    Review of Systems  Constitutional: No fever he does have chills Eyes: No visual changes. ENT: No sore throat. Cardiovascular: Denies chest pain. Respiratory: Denies shortness of breath. Gastrointestinal: Lower abdominal pain.  No nausea, no vomiting.  No diarrhea.  No constipation. Genitourinary:  dysuria. Musculoskeletal: Negative for back pain. Skin: Negative for rash. Neurological: Negative for headaches, focal weakness   ____________________________________________   PHYSICAL EXAM:  VITAL SIGNS: ED Triage Vitals  Enc Vitals Group     BP 01/14/20 0941 136/82     Pulse Rate 01/14/20 0941 86     Resp 01/14/20 0941 18     Temp 01/14/20 0941 99.1 F (37.3 C)     Temp Source 01/14/20 0941 Oral     SpO2 01/14/20 0941 100 %     Weight 01/14/20 0943 143 lb 15.4 oz (65.3 kg)     Height 01/14/20 0943 5\' 3"  (1.6 m)     Head Circumference --      Peak Flow --      Pain Score 01/14/20 0943 0     Pain Loc --      Pain Edu? --      Excl. in [place]? --     Constitutional: Alert and oriented.  Uncomfortable appearing gentleman Eyes: Conjunctivae are normal.  Head: Atraumatic. Nose: No congestion/rhinnorhea. Mouth/Throat: Mucous membranes are moist.  Oropharynx non-erythematous. Neck: No stridor.   Cardiovascular: Normal rate, regular rhythm. Grossly normal heart sounds.  Good peripheral circulation. Respiratory: Normal respiratory effort.  No retractions. Lungs CTAB. Gastrointestinal: Soft and nontender except for over the lower abdomen where he is tender to palpation. No distention. No abdominal bruits. No CVA tenderness. Musculoskeletal: No lower extremity tenderness nor edema.  No joint effusions. Neurologic:  Normal speech and language. No gross focal neurologic  deficits are appreciated. No gait instability. Skin:  Skin is warm, dry and intact. No rash noted. Rectal exam: Prostate is large and somewhat soft but nontender.  Stool is Hemoccult negative  ____________________________________________   LABS (all labs ordered are listed, but only abnormal results are displayed)  Labs Reviewed  COMPREHENSIVE METABOLIC PANEL - Abnormal; Notable for the following components:      Result Value   Chloride 114 (*)    CO2 18 (*)    BUN 58 (*)    Creatinine, Ser 6.07 (*)    Calcium 7.5 (*)    GFR calc non Af Amer 8 (*)    GFR calc Af Amer 10 (*)    All other components within normal limits  CBC - Abnormal; Notable for the following components:   WBC 3.8 (*)    Hemoglobin 10.9 (*)    HCT 33.6 (*)  MCV 73.0 (*)    MCH 23.7 (*)    RDW 18.3 (*)    All other components within normal limits  URINALYSIS, ROUTINE W REFLEX MICROSCOPIC - Abnormal; Notable for the following components:   Color, Urine STRAW (*)    APPearance CLEAR (*)    All other components within normal limits  URINALYSIS, COMPLETE (UACMP) WITH MICROSCOPIC - Abnormal; Notable for the following components:   Color, Urine RED (*)    APPearance CLOUDY (*)    Glucose, UA   (*)    Value: TEST NOT REPORTED DUE TO COLOR INTERFERENCE OF URINE PIGMENT   Hgb urine dipstick   (*)    Value: TEST NOT REPORTED DUE TO COLOR INTERFERENCE OF URINE PIGMENT   Bilirubin Urine   (*)    Value: TEST NOT REPORTED DUE TO COLOR INTERFERENCE OF URINE PIGMENT   Ketones, ur   (*)    Value: TEST NOT REPORTED DUE TO COLOR INTERFERENCE OF URINE PIGMENT   Protein, ur   (*)    Value: TEST NOT REPORTED DUE TO COLOR INTERFERENCE OF URINE PIGMENT   Nitrite   (*)    Value: TEST NOT REPORTED DUE TO COLOR INTERFERENCE OF URINE PIGMENT   Leukocytes,Ua   (*)    Value: TEST NOT REPORTED DUE TO COLOR INTERFERENCE OF URINE PIGMENT   RBC / HPF >50 (*)    All other components within normal limits  SARS CORONAVIRUS 2 BY RT  PCR (HOSPITAL ORDER, [place] [facility])  URINE CULTURE   ____________________________________________  EKG   ____________________________________________  RADIOLOGY  ED MD interpretation:   Official radiology report(s): No results found.  ____________________________________________   PROCEDURES  Procedure(s) performed (including Critical Care):  Procedures   ____________________________________________   INITIAL IMPRESSION / ASSESSMENT AND PLAN / ED COURSE  Patient with acute kidney injury.  Initial UA had no blood in it but there is now apparently blood in the urine as the urine is pink.  We will send another UA.  We will have to get this gentleman in the hospital as his GFR was normal in January.    GFR could be increased due to urinary obstruction from prostate or from some other cause.  He is fairly tender suprapubically.  He is required 2 doses of fentanyl to keep comfortable.  He denies any prostate tenderness on rectal exam however.  We will have to investigate this further.         ____________________________________________   FINAL CLINICAL IMPRESSION(S) / ED DIAGNOSES  Final diagnoses:  AKI (acute kidney injury) ([place])  Urinary retention  Urinary tract infection with hematuria, site unspecified     ED Discharge Orders    None       Note:  This document was prepared using Dragon voice recognition software and may include unintentional dictation errors.    [name], MD 01/14/20 [phone]

## 2020-01-14 NOTE — ED Notes (Signed)
Post-void residual performed, pt noted to still have greater than 930ml urine in bladder.  Will attempt to place foley catheter at this time.

## 2020-01-14 NOTE — Progress Notes (Signed)
Pt has belonging lock up in security safe. A wallet,  cash $225.00 and two debit cards.

## 2020-01-14 NOTE — ED Notes (Signed)
Patient called out from [place].  Patient is complaining of sudden lower abdominal pain.  This RN checked patient's catheter which appears to be draining well.  Patient states pain is 8/10.

## 2020-01-14 NOTE — ED Triage Notes (Signed)
Pt to ER with c/o urinary incontinence for last week.  Pt has no hx of same.  Pt denies pain with urination, states he feels like his bladder may not be emptying completely.  Pt denies any other symptoms.

## 2020-01-14 NOTE — Progress Notes (Signed)
Pt remained tachy. Pt complaining of pain when urinating. Primary nurse notified Hassan Rowan NP. Orders received for baldder scan. 14 ml noted. NP Brenda rounded on pt. Medication ordered. Primary nurse to continue to monitor.

## 2020-01-15 ENCOUNTER — Inpatient Hospital Stay: Payer: Medicare HMO

## 2020-01-15 DIAGNOSIS — N400 Enlarged prostate without lower urinary tract symptoms: Secondary | ICD-10-CM

## 2020-01-15 LAB — COMPREHENSIVE METABOLIC PANEL
ALT: 12 U/L (ref 0–44)
AST: 17 U/L (ref 15–41)
Albumin: 3.3 g/dL — ABNORMAL LOW (ref 3.5–5.0)
Alkaline Phosphatase: 61 U/L (ref 38–126)
Anion gap: 9 (ref 5–15)
BUN: 53 mg/dL — ABNORMAL HIGH (ref 8–23)
CO2: 18 mmol/L — ABNORMAL LOW (ref 22–32)
Calcium: 7.7 mg/dL — ABNORMAL LOW (ref 8.9–10.3)
Chloride: 112 mmol/L — ABNORMAL HIGH (ref 98–111)
Creatinine, Ser: 5.53 mg/dL — ABNORMAL HIGH (ref 0.61–1.24)
GFR calc Af Amer: 11 mL/min — ABNORMAL LOW (ref >60)
GFR calc non Af Amer: 9 mL/min — ABNORMAL LOW (ref >60)
Glucose, Bld: 99 mg/dL (ref 70–99)
Potassium: 4.9 mmol/L (ref 3.5–5.1)
Sodium: 139 mmol/L (ref 135–145)
Total Bilirubin: 0.6 mg/dL (ref 0.3–1.2)
Total Protein: 6.9 g/dL (ref 6.5–8.1)

## 2020-01-15 LAB — PSA: Prostatic Specific Antigen: 3.49 ng/mL (ref 0.00–4.00)

## 2020-01-15 LAB — CBC
HCT: 34.5 % — ABNORMAL LOW (ref 39.0–52.0)
Hemoglobin: 11.6 g/dL — ABNORMAL LOW (ref 13.0–17.0)
MCH: 23.7 pg — ABNORMAL LOW (ref 26.0–34.0)
MCHC: 33.6 g/dL (ref 30.0–36.0)
MCV: 70.4 fL — ABNORMAL LOW (ref 80.0–100.0)
Platelets: 220 10*3/uL (ref 150–400)
RBC: 4.9 MIL/uL (ref 4.22–5.81)
RDW: 17.6 % — ABNORMAL HIGH (ref 11.5–15.5)
WBC: 6.8 10*3/uL (ref 4.0–10.5)
nRBC: 0 % (ref 0.0–0.2)

## 2020-01-15 LAB — MAGNESIUM
Magnesium: 0.7 mg/dL — CL (ref 1.7–2.4)
Magnesium: 2.3 mg/dL (ref 1.7–2.4)

## 2020-01-15 MED ORDER — CHLORHEXIDINE GLUCONATE CLOTH 2 % EX PADS
6.0000 | MEDICATED_PAD | Freq: Every day | CUTANEOUS | Status: DC
  Administered 2020-01-15 – 2020-01-19 (×5): 6 via TOPICAL

## 2020-01-15 MED ORDER — MORPHINE SULFATE (PF) 2 MG/ML IV SOLN
2.0000 mg | Freq: Once | INTRAVENOUS | Status: AC
  Administered 2020-01-15: 2 mg via INTRAVENOUS
  Filled 2020-01-15: qty 1

## 2020-01-15 MED ORDER — MAGNESIUM SULFATE 4 GM/100ML IV SOLN
4.0000 g | Freq: Once | INTRAVENOUS | Status: AC
  Administered 2020-01-15: 4 g via INTRAVENOUS
  Filled 2020-01-15: qty 100

## 2020-01-15 NOTE — Consult Note (Signed)
[address] [place], [place] [address] Phone [phone]. Fax [phone]  Date: 01/15/2020                  Patient Name:  Donald Foster  MRN: [id number]  DOB: 29-Nov-1945  Age / Sex: 74 y.o., male         PCP: Elisabeth Cara, NP                 Service Requesting Consult: IM/ Sharen Hones, MD                 Reason for Consult: ARF            History of Present Illness: Patient is a 74 y.o. male with medical problems of COPD, BPH, hypertension, who was admitted to [facility] on 01/14/2020 for evaluation of dribbling, difficulty urination and incontinence for about 1 week.  In the ER he was diagnosed with severe urinary retention.  Per notes, after Foley was placed, 1 L of urine was obtained.  At night, he had some bladder pain for which he received morphine.  This morning he is pain-free.  He was also incidentally noted to have elevated creatinine of 6.07 at admission.  Nephrology consult has been requested for evaluation Initial urinalysis negative for blood or protein Repeat urinalysis at 1230 shows greater than 50 RBCs, 11-20 WBCs  Medications: Outpatient medications: Medications Prior to Admission  Medication Sig Dispense Refill Last Dose  . amLODipine (NORVASC) 10 MG tablet Take 10 mg by mouth daily.    01/13/2020 at 0800  . atorvastatin (LIPITOR) 20 MG tablet Take 20 mg by mouth at bedtime.    01/13/2020 at 2000  . Fluticasone-Salmeterol (ADVAIR) 100-50 MCG/DOSE AEPB Inhale 1 puff into the lungs 2 (two) times daily.   01/13/2020 at 2000  . losartan (COZAAR) 50 MG tablet Take 50 mg by mouth daily.    01/13/2020 at 0800  . omeprazole (PRILOSEC) 40 MG capsule Take 1 capsule (40 mg total) by mouth 2 (two) times daily before a meal. 180 capsule 0 01/13/2020 at 2000  . SPIRIVA HANDIHALER 18 MCG inhalation capsule Place 18 mcg into inhaler and inhale daily.    01/13/2020 at 0800  . tamsulosin (FLOMAX) 0.4 MG CAPS capsule Take 0.4 mg by mouth daily.    01/13/2020 at 0800  . albuterol  (VENTOLIN HFA) 108 (90 Base) MCG/ACT inhaler Inhale 2 puffs into the lungs every 4 (four) hours as needed for wheezing or shortness of breath. Every 4-6 hrs prn   prn at prn  . aluminum hydroxide-magnesium carbonate (GAVISCON) 95-358 MG/15ML SUSP Take 15 mLs by mouth 4 (four) times daily - after meals and at bedtime. 1 Bottle 2 prn at prn  . Multiple Vitamin (MULTIVITAMIN WITH MINERALS) TABS tablet Take 1 tablet by mouth daily. (Patient not taking: Reported on 01/14/2020)   Not Taking at Unknown time  . traMADol (ULTRAM) 50 MG tablet Take 1 tablet (50 mg total) by mouth every 12 (twelve) hours as needed for severe pain. (Patient not taking: Reported on 01/14/2020) 10 tablet 0 Completed Course at Unknown time    Current medications: Current Facility-Administered Medications  Medication Dose Route Frequency Provider Last Rate Last Admin  . albuterol (PROVENTIL) (2.5 MG/3ML) 0.083% nebulizer solution 3 mL  3 mL Inhalation Q4H PRN Sharen Hones, MD      . amLODipine (NORVASC) tablet 10 mg  10 mg Oral Daily Sharen Hones, MD   10 mg at 01/14/20 1618  .  atorvastatin (LIPITOR) tablet 20 mg  20 mg Oral QHS Sharen Hones, MD   20 mg at 01/14/20 2145  . cefTRIAXone (ROCEPHIN) 1 g in sodium chloride 0.9 % 100 mL IVPB  1 g Intravenous Q24H Sharen Hones, MD      . Chlorhexidine Gluconate Cloth 2 % PADS 6 each  6 each Topical Daily Zhang, Dekui, MD      . fluticasone furoate-vilanterol (BREO ELLIPTA) 100-25 MCG/INH 1 puff  1 puff Inhalation Daily Sharen Hones, MD      . heparin injection 5,000 Units  5,000 Units Subcutaneous Q8H Sharen Hones, MD   5,000 Units at 01/15/20 0550  . lactated ringers infusion   Intravenous Continuous Sharen Hones, MD 125 mL/hr at 01/15/20 0435 Rate Verify at 01/15/20 0435  . pantoprazole (PROTONIX) EC tablet 40 mg  40 mg Oral Daily Sharen Hones, MD   40 mg at 01/14/20 1618  . phenazopyridine (PYRIDIUM) tablet 100 mg  100 mg Oral TID WC Sharion Settler, NP   100 mg at 01/14/20 2145  .  tamsulosin (FLOMAX) capsule 0.4 mg  0.4 mg Oral Daily Sharen Hones, MD   0.4 mg at 01/14/20 1618  . tiotropium (SPIRIVA) inhalation capsule ([facility] use ONLY) 18 mcg  18 mcg Inhalation Daily Sharen Hones, MD      . traMADol Veatrice Bourbon) tablet 50 mg  50 mg Oral Q12H PRN Sharen Hones, MD   50 mg at 01/14/20 1941      Allergies: No Known Allergies    Past Medical History: Past Medical History:  Diagnosis Date  . AKI (acute kidney injury) ([place]) 01/14/2020  . COPD (chronic obstructive pulmonary disease) ([place])   . GERD (gastroesophageal reflux disease)   . Hypercholesteremia   . Hypertension      Past Surgical History: Past Surgical History:  Procedure Laterality Date  . CHOLECYSTECTOMY    . COLONOSCOPY WITH PROPOFOL N/A 08/17/2018   Procedure: COLONOSCOPY WITH PROPOFOL;  Surgeon: Lin Landsman, MD;  Location: [facility] ENDOSCOPY;  Service: Gastroenterology;  Laterality: N/A;  . ESOPHAGOGASTRODUODENOSCOPY (EGD) WITH PROPOFOL N/A 08/01/2018   Procedure: ESOPHAGOGASTRODUODENOSCOPY (EGD) WITH PROPOFOL;  Surgeon: Lin Landsman, MD;  Location: [facility] ENDOSCOPY;  Service: Gastroenterology;  Laterality: N/A;  . HEMORRHOID SURGERY N/A 02/22/2019   Procedure: HEMORRHOIDECTOMY;  Surgeon: Jules Husbands, MD;  Location: [facility] ORS;  Service: General;  Laterality: N/A;  . UPPER GI ENDOSCOPY  08/17/2018   Procedure: UPPER GI ENDOSCOPY;  Surgeon: Lin Landsman, MD;  Location: [facility] ENDOSCOPY;  Service: Gastroenterology;;     Family History: Family History  Problem Relation Age of Onset  . Cancer Father      Social History: Social History   Socioeconomic History  . Marital status: Single    Spouse name: Not on file  . Number of children: 3  . Years of education: Not on file  . Highest education level: Not on file  Occupational History  . Not on file  Tobacco Use  . Smoking status: Former Smoker    Packs/day: 0.75    Years: 57.00    Pack years: 42.75    Types: Cigarettes    Start date:  03/12/2018  . Smokeless tobacco: Never Used  Vaping Use  . Vaping Use: Former  Substance and Sexual Activity  . Alcohol use: Not Currently  . Drug use: Not Currently  . Sexual activity: Not on file  Other Topics Concern  . Not on file  Social History Narrative  . Not on file  Social Determinants of Health   Financial Resource Strain:   . Difficulty of Paying Living Expenses:   Food Insecurity:   . Worried About Charity fundraiser in the Last Year:   . Arboriculturist in the Last Year:   Transportation Needs:   . Film/video editor (Medical):   Marland Kitchen Lack of Transportation (Non-Medical):   Physical Activity:   . Days of Exercise per Week:   . Minutes of Exercise per Session:   Stress:   . Feeling of Stress :   Social Connections:   . Frequency of Communication with Friends and Family:   . Frequency of Social Gatherings with Friends and Family:   . Attends Religious Services:   . Active Member of Clubs or Organizations:   . Attends Archivist Meetings:   Marland Kitchen Marital Status:   Intimate Partner Violence:   . Fear of Current or Ex-Partner:   . Emotionally Abused:   Marland Kitchen Physically Abused:   . Sexually Abused:      Review of Systems: Gen: No fevers, chills or weight changes HEENT: No vision or hearing complaints CV: No chest pain or shortness of breath Resp: No cough or sputum production.  History of previous tobacco use.  COPD GI: Appetite is good.  No nausea, vomiting or blood in stool GU : No previous history of kidney disease or kidney stones.  No previous history of hematuria.  Has prostate problems for about 1 year MS: No complaints.  Ambulatory at home.  Works as a Freight forwarder Derm:    No complaints Psych: No complaints Heme: No complaints Neuro: No complaints Endocrine.  No complaints  Vital Signs: Blood pressure 124/78, pulse (!) 102, temperature 98.4 F (36.9 C), temperature source Oral, resp. rate 16, height 5\' 3"  (1.6 m), weight 65.3 kg,  SpO2 97 %.   Intake/Output Summary (Last 24 hours) at 01/15/2020 1004 Last data filed at 01/15/2020 0540 Gross per 24 hour  Intake 1813.72 ml  Output 5150 ml  Net -3336.28 ml    Weight trends: [employer]   01/14/20 0943  Weight: 65.3 kg    Physical Exam: General:  No acute distress, laying in the bed  HEENT  moist oral mucous membranes, anicteric  Neck:  Supple, no JVD, no masses  Lungs:  Normal breathing effort, clear to auscultation  Heart::  Regular, no rub or gallop  Abdomen:  Soft, nontender  Extremities:  No peripheral edema  Neurologic:  Alert, oriented  Skin:  Warm, dry  Foley:  In place with blood-tinged urine       Lab results: Basic Metabolic Panel: Recent Labs  Lab 01/14/20 0950 01/15/20 0402  NA 140 139  K 4.5 4.9  CL 114* 112*  CO2 18* 18*  GLUCOSE 78 99  BUN 58* 53*  CREATININE 6.07* 5.53*  CALCIUM 7.5* 7.7*  MG 1.0* 0.7*    Liver Function Tests: Recent Labs  Lab 01/15/20 0402  AST 17  ALT 12  ALKPHOS 61  BILITOT 0.6  PROT 6.9  ALBUMIN 3.3*   No results for input(s): LIPASE, AMYLASE in the last 168 hours. No results for input(s): AMMONIA in the last 168 hours.  CBC: Recent Labs  Lab 01/14/20 2033 01/15/20 0402  WBC 7.0 6.8  HGB 12.7* 11.6*  HCT 36.9* 34.5*  MCV 70.4* 70.4*  PLT 227 220    Cardiac Enzymes: No results for input(s): CKTOTAL, TROPONINI in the last 168 hours.  BNP: Invalid input(s): POCBNP  CBG: No results for input(s): GLUCAP in the last 168 hours.  Microbiology: Recent Results (from the past 720 hour(s))  SARS Coronavirus 2 by RT PCR (hospital order, performed in [facility] hospital lab) Nasopharyngeal Nasopharyngeal Swab     Status: None   Collection Time: 01/14/20 12:31 PM   Specimen: Nasopharyngeal Swab  Result Value Ref Range Status   SARS Coronavirus 2 NEGATIVE NEGATIVE Final    Comment: (NOTE) SARS-CoV-2 target nucleic acids are NOT DETECTED.  The SARS-CoV-2 RNA is generally detectable in  upper and lower respiratory specimens during the acute phase of infection. The lowest concentration of SARS-CoV-2 viral copies this assay can detect is 250 copies / mL. A negative result does not preclude SARS-CoV-2 infection and should not be used as the sole basis for treatment or other patient management decisions.  A negative result may occur with improper specimen collection / handling, submission of specimen other than nasopharyngeal swab, presence of viral mutation(s) within the areas targeted by this assay, and inadequate number of viral copies (<250 copies / mL). A negative result must be combined with clinical observations, patient history, and epidemiological information.  Fact Sheet for Patients:   StrictlyIdeas.no  Fact Sheet for Healthcare Providers: BankingDealers.co.za  This test is not yet approved or  cleared by the [place] FDA and has been authorized for detection and/or diagnosis of SARS-CoV-2 by FDA under an Emergency Use Authorization (EUA).  This EUA will remain in effect (meaning this test can be used) for the duration of the COVID-19 declaration under Section 564(b)(1) of the Act, 21 U.S.C. section 360bbb-3(b)(1), unless the authorization is terminated or revoked sooner.  Performed at [facility], [address] [place], [place] [address]      Coagulation Studies: No results for input(s): LABPROT, INR in the last 72 hours.  Urinalysis: Recent Labs    01/14/20 0950 01/14/20 1231  COLORURINE STRAW* RED*  LABSPEC 1.006 1.012  PHURINE 5.0 TEST NOT REPORTED DUE TO COLOR INTERFERENCE OF URINE PIGMENT  GLUCOSEU NEGATIVE TEST NOT REPORTED DUE TO COLOR INTERFERENCE OF URINE PIGMENT*  HGBUR NEGATIVE TEST NOT REPORTED DUE TO COLOR INTERFERENCE OF URINE PIGMENT*  BILIRUBINUR NEGATIVE TEST NOT REPORTED DUE TO COLOR INTERFERENCE OF URINE PIGMENT*  KETONESUR NEGATIVE TEST NOT REPORTED DUE TO COLOR  INTERFERENCE OF URINE PIGMENT*  PROTEINUR NEGATIVE TEST NOT REPORTED DUE TO COLOR INTERFERENCE OF URINE PIGMENT*  NITRITE NEGATIVE TEST NOT REPORTED DUE TO COLOR INTERFERENCE OF URINE PIGMENT*  LEUKOCYTESUR NEGATIVE TEST NOT REPORTED DUE TO COLOR INTERFERENCE OF URINE PIGMENT*        Imaging: DG Chest 1 View  Result Date: 01/14/2020 CLINICAL DATA:  74 year old male with tachycardia. EXAM: CHEST  1 VIEW COMPARISON:  Chest radiograph dated 08/06/2019. FINDINGS: Left lung base linear atelectasis/scarring. No focal consolidation, pleural effusion, or pneumothorax. The cardiac silhouette is within limits. No acute osseous pathology. Cholecystectomy clips. IMPRESSION: No active disease. Electronically Signed   By: Anner Crete M.D.   On: 01/14/2020 20:34      Assessment & Plan: Pt is a 74 y.o. African-American  male with COPD, BPH, hypertension, was admitted on 01/14/2020 with Urinary retention [R33.9] AKI (acute kidney injury) ([place]) [N17.9] Urinary tract infection with hematuria, site unspecified [N39.0, R31.9]  #.Acute kidney injury #Chronic kidney disease stage IIIa GFR 52-60 in January 2021 #Acute urinary retention #History of BPH #Gross hematuria  Acute kidney injury is likely secondary to acute urinary retention.  Hematuria may be due to Foley placement trauma or  rapid bladder decompression. Patient has had good urine output of greater than 5 L since Foley was placed Agree with urology evaluation  Renal ultrasound Follow Creatinine trends. Expected to improve close to baseline. Electrolytes and volume status are acceptable.  No acute indication for dialysis at present.     LOS: 1 Presli Fanguy 7/6/202110:04 AM    Note: This note was prepared with Dragon dictation. Any transcription errors are unintentional

## 2020-01-15 NOTE — Progress Notes (Signed)
Primary nurse received a call from Lab that pt Magnesium was 0.7. Primary nurse notified Hassan Rowan NP and orders received to give IV mag. Primary nurse to continue to monitor.

## 2020-01-15 NOTE — Consult Note (Addendum)
Urology Consult  I have been asked to see the patient by Dr. Roosevelt Locks, for evaluation and management of urinary retention with acute renal failure.  Chief Complaint: Weak stream, suprapubic pain  History of Present Illness: Donald Foster is a 74 y.o. year old male with PMH BPH on Flomax 0.4mg  daily who presented to the ED yesterday with a one-week history of urinary incontinence, weak stream, suprapubic pain, and dysuria. Bladder scan revealed >9107mL. Foley placed without difficulty, though he did continue to have painful bladder spasms following placement.  Admission labs notable for AKI with creatinine 6.07 (baseline 1.4-1.7). Urine was initially noted to be clear in color, but it became pink following Foley placement. UA initially pan-negative, however repeat testing revealed >50RBCs/hpf and 11-20 WBCs/hpf; urine culture pending. On antibiotics as below.  Creatinine down today, 5.53. WBC count WNL, 6.8. PSA drawn, pending.  Patient was previously seen by Dr. Bernardo Heater at [facility], most recently in 2016, for his history of BPH with LUTS and incomplete bladder emptying. Labs from that visit notable for PVR 177mL and PSA 2.3.  Today, patient reports resolution of his abdominal pain.  He has a history of intermittent constipation, worse in the past week, last BM 2-3 days ago. He denies recent medication changes. He continues to take Flomax 0.4mg  daily and states he does have occasional lightheadedness with standing. He has not seen another urologist since 2016.  Foley in place draining pink urine. UOP >5L yesterday.  Anti-infectives (From admission, onward)   Start     Dose/Rate Route Frequency Ordered Stop   01/15/20 1400  cefTRIAXone (ROCEPHIN) 1 g in sodium chloride 0.9 % 100 mL IVPB     Discontinue     1 g 200 mL/hr over 30 Minutes Intravenous Every 24 hours 01/14/20 1506     01/14/20 1445  cefTRIAXone (ROCEPHIN) 1 g in sodium chloride 0.9 % 100 mL IVPB        1 g 200 mL/hr over 30  Minutes Intravenous  Once 01/14/20 1435 01/14/20 1618      Past Medical History:  Diagnosis Date  . AKI (acute kidney injury) ([place]) 01/14/2020  . COPD (chronic obstructive pulmonary disease) ([place])   . GERD (gastroesophageal reflux disease)   . Hypercholesteremia   . Hypertension     Past Surgical History:  Procedure Laterality Date  . CHOLECYSTECTOMY    . COLONOSCOPY WITH PROPOFOL N/A 08/17/2018   Procedure: COLONOSCOPY WITH PROPOFOL;  Surgeon: Lin Landsman, MD;  Location[facility] ENDOSCOPY;  Service: Gastroenterology;  Laterality: N/A;  . ESOPHAGOGASTRODUODENOSCOPY (EGD) WITH PROPOFOL N/A 08/01/2018   Procedure: ESOPHAGOGASTRODUODENOSCOPY (EGD) WITH PROPOFOL;  Surgeon: Lin Landsman, MD;  Location: [facility] ENDOSCOPY;  Service: Gastroenterology;  Laterality: N/A;  . HEMORRHOID SURGERY N/A 02/22/2019   Procedure: HEMORRHOIDECTOMY;  Surgeon: Jules Husbands, MD;  Location: [facility] ORS;  Service: General;  Laterality: N/A;  . UPPER GI ENDOSCOPY  08/17/2018   Procedure: UPPER GI ENDOSCOPY;  Surgeon: Lin Landsman, MD;  Location: [facility] ENDOSCOPY;  Service: Gastroenterology;;    Home Medications:  Current Meds  Medication Sig  . amLODipine (NORVASC) 10 MG tablet Take 10 mg by mouth daily.   Marland Kitchen atorvastatin (LIPITOR) 20 MG tablet Take 20 mg by mouth at bedtime.   . Fluticasone-Salmeterol (ADVAIR) 100-50 MCG/DOSE AEPB Inhale 1 puff into the lungs 2 (two) times daily.  Marland Kitchen losartan (COZAAR) 50 MG tablet Take 50 mg by mouth daily.   Marland Kitchen omeprazole (PRILOSEC) 40 MG capsule Take  1 capsule (40 mg total) by mouth 2 (two) times daily before a meal.  . SPIRIVA HANDIHALER 18 MCG inhalation capsule Place 18 mcg into inhaler and inhale daily.   . tamsulosin (FLOMAX) 0.4 MG CAPS capsule Take 0.4 mg by mouth daily.     Allergies: No Known Allergies  Family History  Problem Relation Age of Onset  . Cancer Father     Social History:  reports that he has quit smoking. His smoking use included  cigarettes. He started smoking about 22 months ago. He has a 42.75 pack-year smoking history. He has never used smokeless tobacco. He reports previous alcohol use. He reports previous drug use.  ROS: A complete review of systems was performed.  All systems are negative except for pertinent findings as noted.  Physical Exam:  Vital signs in last 24 hours: Temp:  [97.7 F (36.5 C)-99.2 F (37.3 C)] 98.4 F (36.9 C) (07/06 0540) Pulse Rate:  [90-122] 102 (07/06 0540) Resp:  [16-20] 16 (07/06 0540) BP: (120-184)/(73-88) 124/78 (07/06 0540) SpO2:  [96 %-100 %] 97 % (07/06 0540) Constitutional:  Alert and oriented, no acute distress HEENT: [place] AT, moist mucus membranes Cardiovascular: No clubbing, cyanosis, or edema. Respiratory: Normal respiratory effort Skin: No rashes, bruises or suspicious lesions Neurologic: Grossly intact, no focal deficits, moving all 4 extremities Psychiatric: Normal mood and affect  Laboratory Data:  Recent Labs    01/14/20 0950 01/14/20 2033 01/15/20 0402  WBC 3.8* 7.0 6.8  HGB 10.9* 12.7* 11.6*  HCT 33.6* 36.9* 34.5*   Recent Labs    01/14/20 0950 01/15/20 0402  NA 140 139  K 4.5 4.9  CL 114* 112*  CO2 18* 18*  GLUCOSE 78 99  BUN 58* 53*  CREATININE 6.07* 5.53*  CALCIUM 7.5* 7.7*   Urinalysis    Component Value Date/Time   COLORURINE RED (A) 01/14/2020 1231   APPEARANCEUR CLOUDY (A) 01/14/2020 1231   LABSPEC 1.012 01/14/2020 1231   PHURINE  01/14/2020 1231    TEST NOT REPORTED DUE TO COLOR INTERFERENCE OF URINE PIGMENT   GLUCOSEU (A) 01/14/2020 1231    TEST NOT REPORTED DUE TO COLOR INTERFERENCE OF URINE PIGMENT   HGBUR (A) 01/14/2020 1231    TEST NOT REPORTED DUE TO COLOR INTERFERENCE OF URINE PIGMENT   BILIRUBINUR (A) 01/14/2020 1231    TEST NOT REPORTED DUE TO COLOR INTERFERENCE OF URINE PIGMENT   KETONESUR (A) 01/14/2020 1231    TEST NOT REPORTED DUE TO COLOR INTERFERENCE OF URINE PIGMENT   PROTEINUR (A) 01/14/2020 1231    TEST  NOT REPORTED DUE TO COLOR INTERFERENCE OF URINE PIGMENT   NITRITE (A) 01/14/2020 1231    TEST NOT REPORTED DUE TO COLOR INTERFERENCE OF URINE PIGMENT   LEUKOCYTESUR (A) 01/14/2020 1231    TEST NOT REPORTED DUE TO COLOR INTERFERENCE OF URINE PIGMENT   Results for orders placed or performed during the hospital encounter of 01/14/20  SARS Coronavirus 2 by RT PCR (hospital order, performed in [place] hospital lab) Nasopharyngeal Nasopharyngeal Swab     Status: None   Collection Time: 01/14/20 12:31 PM   Specimen: Nasopharyngeal Swab  Result Value Ref Range Status   SARS Coronavirus 2 NEGATIVE NEGATIVE Final    Comment: (NOTE) SARS-CoV-2 target nucleic acids are NOT DETECTED.  The SARS-CoV-2 RNA is generally detectable in upper and lower respiratory specimens during the acute phase of infection. The lowest concentration of SARS-CoV-2 viral copies this assay can detect is 250 copies / mL. A  negative result does not preclude SARS-CoV-2 infection and should not be used as the sole basis for treatment or other patient management decisions.  A negative result may occur with improper specimen collection / handling, submission of specimen other than nasopharyngeal swab, presence of viral mutation(s) within the areas targeted by this assay, and inadequate number of viral copies (<250 copies / mL). A negative result must be combined with clinical observations, patient history, and epidemiological information.  Fact Sheet for Patients:   StrictlyIdeas.no  Fact Sheet for Healthcare Providers: BankingDealers.co.za  This test is not yet approved or  cleared by the [place] FDA and has been authorized for detection and/or diagnosis of SARS-CoV-2 by FDA under an Emergency Use Authorization (EUA).  This EUA will remain in effect (meaning this test can be used) for the duration of the COVID-19 declaration under Section 564(b)(1) of the Act, 21  U.S.C. section 360bbb-3(b)(1), unless the authorization is terminated or revoked sooner.  Performed at [facility], [address], [place], [place] [address]    Assessment & Plan:  74 year old male with PMH BPH on Flomax admitted for acute urinary retention with acute renal failure.  He developed gross hematuria following Foley catheter placement with elevated UOP over the past 24 hours.  Creatinine downtrending following Foley placement.  1. Urinary retention  Likely secondary to known history of BPH, though constipation may be contributory.  Initial UA reassuring for infection.  Foley in place draining well today; he will need to retain this for at least 1 week for bladder rest with plans for outpatient voiding trial.  Do not recommend increasing Flomax given patient reports of mild orthostatic hypotension on his current dose.  Patient will likely require bladder outlet procedure, to be discussed on an outpatient basis.  2. Acute renal failure Likely postrenal in etiology.  Creatinine downtrending following Foley placement, consistent with bladder outlet obstruction.  Recommend renal ultrasound tomorrow to evaluate for persistent hydronephrosis.  3. Gross hematuria  Suspect mucosal bleeding in the setting of bladder decompression, however he will require outpatient cystoscopy to rule out other etiologies.  4. Postobstructive diuresis  Recommend fluid resuscitation and electrolyte monitoring for management.  Recommendations: -Follow urine cultures and narrow antibiotics as indicated -Bowel regimen for management of constipation -Continue daily Flomax -Keep Foley in place with plans for outpatient voiding trial in 1 week and outpatient cystoscopy in 2 to 3 weeks (message sent to schedule) -Renal ultrasound tomorrow -Fluid resuscitation and electrolyte monitoring  Thank you for involving me in this patient's care, I will continue to follow along.  Debroah Loop,  PA-C 01/15/2020 11:50 AM

## 2020-01-15 NOTE — Progress Notes (Signed)
PROGRESS NOTE    Donald Foster  IHW:[id number] DOB: 1945-10-08 DOA: 01/14/2020 PCP: Elisabeth Cara, NP   Chief complaint.  Urinary retension. Brief Narrative:   Patient is a 74 year old male with history of COPD, benign prostate hypertrophy, dyslipidemia and essential hypertension who present to the hospital with dysuria and urinary retention.  Upon arrival to the emergency room, he was found to have urinary retention, Foley catheter was anchored, drained 1 L of urine, initially with clear then become bloody.  He was placed on Rocephin and a urine culture was sent out.  His creatinine was 6.07, WBC 3.8, hemoglobin 10.9.  7/6.  Patient has more than 5 L urine output today.  Renal function stable.  Supplement magnesium.   Assessment & Plan:   Active Problems:   BPH (benign prostatic hyperplasia)   AKI (acute kidney injury) ([place])   Acute cystitis with hematuria   COPD with chronic bronchitis ([place])  #1. Acute renal failure secondary to obstruction due to benign prostate hypertrophy. Patient had made 5 L urine, but renal function is improving.  Appreciate nephrology consult.  Continue Foley catheter.  Patient is also on Flomax.  Repeat BMP tomorrow.  2.  Urinary retention secondary to benign prostate hypertrophy. Obtain consult from urology.  Also check a PSA level.  3.  Urinary tract infection with hematuria. Continue Rocephin, pending urine culture results.  4.  COPD. Stable.  5.  Essential hypertension. Continue amlodipine.   DVT prophylaxis: Heparin Code Status: Full Family Communication: None Disposition Plan:  . Patient came from:Home           . Anticipated d/c place: Home . Barriers to d/c OR conditions which need to be met to effect a safe d/c:   Consultants:   Nephrology and urology.  Procedures: None Antimicrobials: Rocephin.  Subjective: Patient feels much better today.  Large amount of urine output.  Dysuria improving, still has gross hematuria.  No  nausea vomiting or diarrhea. No shortness of breath or cough.  Objective: Vitals:   01/14/20 1931 01/14/20 2135 01/15/20 0118 01/15/20 0540  BP: (!) 143/86 (!) 150/81 120/88 124/78  Pulse: (!) 120 (!) 119 (!) 122 (!) 102  Resp: 16 20 20 16   Temp: 97.7 F (36.5 C) 98.5 F (36.9 C) 98.1 F (36.7 C) 98.4 F (36.9 C)  TempSrc: Oral Oral Oral Oral  SpO2: 97% 96% 97% 97%  Weight:      Height:        Intake/Output Summary (Last 24 hours) at 01/15/2020 1036 Last data filed at 01/15/2020 0540 Gross per 24 hour  Intake 1813.72 ml  Output 4150 ml  Net -2336.28 ml   Filed Weights   01/14/20 0943  Weight: 65.3 kg    Examination:  General exam: Appears calm and comfortable  Respiratory system: Clear to auscultation. Respiratory effort normal. Cardiovascular system: S1 & S2 heard, RRR. No JVD, murmurs, rubs, gallops or clicks. No pedal edema. Gastrointestinal system: Abdomen is nondistended, soft and nontender. No organomegaly or masses felt. Normal bowel sounds heard. Central nervous system: Alert and oriented. No focal neurological deficits. Extremities: Symmetric 5 x 5 power. Skin: No rashes, lesions or ulcers Psychiatry: Judgement and insight appear normal. Mood & affect appropriate.     Data Reviewed: I have personally reviewed following labs and imaging studies  CBC: Recent Labs  Lab 01/14/20 0950 01/14/20 2033 01/15/20 0402  WBC 3.8* 7.0 6.8  HGB 10.9* 12.7* 11.6*  HCT 33.6* 36.9* 34.5*  MCV 73.0* 70.4* 70.4*  PLT 188 227 003   Basic Metabolic Panel: Recent Labs  Lab 01/14/20 0950 01/15/20 0402  NA 140 139  K 4.5 4.9  CL 114* 112*  CO2 18* 18*  GLUCOSE 78 99  BUN 58* 53*  CREATININE 6.07* 5.53*  CALCIUM 7.5* 7.7*  MG 1.0* 0.7*   GFR: Estimated Creatinine Clearance: 9.7 mL/min (A) (by C-G formula based on SCr of 5.53 mg/dL (H)). Liver Function Tests: Recent Labs  Lab 01/14/20 0950 01/15/20 0402  AST 20 17  ALT 15 12  ALKPHOS 63 61  BILITOT 0.6  0.6  PROT 7.7 6.9  ALBUMIN 3.8 3.3*   No results for input(s): LIPASE, AMYLASE in the last 168 hours. No results for input(s): AMMONIA in the last 168 hours. Coagulation Profile: No results for input(s): INR, PROTIME in the last 168 hours. Cardiac Enzymes: No results for input(s): CKTOTAL, CKMB, CKMBINDEX, TROPONINI in the last 168 hours. BNP (last 3 results) No results for input(s): PROBNP in the last 8760 hours. HbA1C: No results for input(s): HGBA1C in the last 72 hours. CBG: No results for input(s): GLUCAP in the last 168 hours. Lipid Profile: No results for input(s): CHOL, HDL, LDLCALC, TRIG, CHOLHDL, LDLDIRECT in the last 72 hours. Thyroid Function Tests: No results for input(s): TSH, T4TOTAL, FREET4, T3FREE, THYROIDAB in the last 72 hours. Anemia Panel: No results for input(s): VITAMINB12, FOLATE, FERRITIN, TIBC, IRON, RETICCTPCT in the last 72 hours. Sepsis Labs: No results for input(s): PROCALCITON, LATICACIDVEN in the last 168 hours.  Recent Results (from the past 240 hour(s))  SARS Coronavirus 2 by RT PCR (hospital order, performed in [facility] hospital lab) Nasopharyngeal Nasopharyngeal Swab     Status: None   Collection Time: 01/14/20 12:31 PM   Specimen: Nasopharyngeal Swab  Result Value Ref Range Status   SARS Coronavirus 2 NEGATIVE NEGATIVE Final    Comment: (NOTE) SARS-CoV-2 target nucleic acids are NOT DETECTED.  The SARS-CoV-2 RNA is generally detectable in upper and lower respiratory specimens during the acute phase of infection. The lowest concentration of SARS-CoV-2 viral copies this assay can detect is 250 copies / mL. A negative result does not preclude SARS-CoV-2 infection and should not be used as the sole basis for treatment or other patient management decisions.  A negative result may occur with improper specimen collection / handling, submission of specimen other than nasopharyngeal swab, presence of viral mutation(s) within the areas targeted  by this assay, and inadequate number of viral copies (<250 copies / mL). A negative result must be combined with clinical observations, patient history, and epidemiological information.  Fact Sheet for Patients:   StrictlyIdeas.no  Fact Sheet for Healthcare Providers: BankingDealers.co.za  This test is not yet approved or  cleared by the [place] FDA and has been authorized for detection and/or diagnosis of SARS-CoV-2 by FDA under an Emergency Use Authorization (EUA).  This EUA will remain in effect (meaning this test can be used) for the duration of the COVID-19 declaration under Section 564(b)(1) of the Act, 21 U.S.C. section 360bbb-3(b)(1), unless the authorization is terminated or revoked sooner.  Performed at [facility], [address], [place], [place] [address]          Radiology Studies: DG Chest 1 View  Result Date: 01/14/2020 CLINICAL DATA:  74 year old male with tachycardia. EXAM: CHEST  1 VIEW COMPARISON:  Chest radiograph dated 08/06/2019. FINDINGS: Left lung base linear atelectasis/scarring. No focal consolidation, pleural effusion, or pneumothorax. The cardiac silhouette is within limits. No acute  osseous pathology. Cholecystectomy clips. IMPRESSION: No active disease. Electronically Signed   By: Anner Crete M.D.   On: 01/14/2020 20:34        Scheduled Meds: . amLODipine  10 mg Oral Daily  . atorvastatin  20 mg Oral QHS  . Chlorhexidine Gluconate Cloth  6 each Topical Daily  . fluticasone furoate-vilanterol  1 puff Inhalation Daily  . heparin  5,000 Units Subcutaneous Q8H  . pantoprazole  40 mg Oral Daily  . phenazopyridine  100 mg Oral TID WC  . tamsulosin  0.4 mg Oral Daily  . tiotropium  18 mcg Inhalation Daily   Continuous Infusions: . cefTRIAXone (ROCEPHIN)  IV    . lactated ringers 125 mL/hr at 01/15/20 0435     LOS: 1 day    Time spent: 28 minutes    Sharen Hones,  MD [facility]   To contact the attending provider between 7A-7P or the covering provider during after hours 7P-7A, please log into the web site [email or web address] and access using universal [place] password for that web site. If you do not have the password, please call the hospital operator.  01/15/2020, 10:36 AM

## 2020-01-16 LAB — BASIC METABOLIC PANEL
Anion gap: 9 (ref 5–15)
BUN: 44 mg/dL — ABNORMAL HIGH (ref 8–23)
CO2: 20 mmol/L — ABNORMAL LOW (ref 22–32)
Calcium: 8.3 mg/dL — ABNORMAL LOW (ref 8.9–10.3)
Chloride: 110 mmol/L (ref 98–111)
Creatinine, Ser: 4.27 mg/dL — ABNORMAL HIGH (ref 0.61–1.24)
GFR calc Af Amer: 15 mL/min — ABNORMAL LOW (ref >60)
GFR calc non Af Amer: 13 mL/min — ABNORMAL LOW (ref >60)
Glucose, Bld: 100 mg/dL — ABNORMAL HIGH (ref 70–99)
Potassium: 4.6 mmol/L (ref 3.5–5.1)
Sodium: 139 mmol/L (ref 135–145)

## 2020-01-16 LAB — CBC WITH DIFFERENTIAL/PLATELET
Abs Immature Granulocytes: 0.01 10*3/uL (ref 0.00–0.07)
Basophils Absolute: 0 10*3/uL (ref 0.0–0.1)
Basophils Relative: 0 %
Eosinophils Absolute: 0.1 10*3/uL (ref 0.0–0.5)
Eosinophils Relative: 2 %
HCT: 33.6 % — ABNORMAL LOW (ref 39.0–52.0)
Hemoglobin: 11.6 g/dL — ABNORMAL LOW (ref 13.0–17.0)
Immature Granulocytes: 0 %
Lymphocytes Relative: 30 %
Lymphs Abs: 1.6 10*3/uL (ref 0.7–4.0)
MCH: 23.9 pg — ABNORMAL LOW (ref 26.0–34.0)
MCHC: 34.5 g/dL (ref 30.0–36.0)
MCV: 69.1 fL — ABNORMAL LOW (ref 80.0–100.0)
Monocytes Absolute: 0.4 10*3/uL (ref 0.1–1.0)
Monocytes Relative: 8 %
Neutro Abs: 3.2 10*3/uL (ref 1.7–7.7)
Neutrophils Relative %: 60 %
Platelets: 217 10*3/uL (ref 150–400)
RBC: 4.86 MIL/uL (ref 4.22–5.81)
RDW: 17.5 % — ABNORMAL HIGH (ref 11.5–15.5)
Smear Review: NORMAL
WBC: 5.3 10*3/uL (ref 4.0–10.5)
nRBC: 0 % (ref 0.0–0.2)

## 2020-01-16 LAB — URINE CULTURE: Culture: <10000 — AB

## 2020-01-16 LAB — MAGNESIUM: Magnesium: 1.5 mg/dL — ABNORMAL LOW (ref 1.7–2.4)

## 2020-01-16 MED ORDER — OXYCODONE HCL 5 MG PO TABS
5.0000 mg | ORAL_TABLET | Freq: Four times a day (QID) | ORAL | Status: DC | PRN
  Administered 2020-01-17 – 2020-01-18 (×5): 5 mg via ORAL
  Filled 2020-01-16 (×5): qty 1

## 2020-01-16 MED ORDER — MAGNESIUM SULFATE 2 GM/50ML IV SOLN
2.0000 g | Freq: Once | INTRAVENOUS | Status: AC
  Administered 2020-01-16: 2 g via INTRAVENOUS
  Filled 2020-01-16: qty 50

## 2020-01-16 MED ORDER — TRAMADOL HCL 50 MG PO TABS
50.0000 mg | ORAL_TABLET | Freq: Two times a day (BID) | ORAL | Status: DC | PRN
  Administered 2020-01-17: 50 mg via ORAL
  Filled 2020-01-16: qty 1

## 2020-01-16 NOTE — Progress Notes (Signed)
PROGRESS NOTE    Donald Foster  MRN:[id number] DOB: 03/29/1946 DOA: 01/14/2020 PCP: Spencer, Nicole, NP   Chief complaint.  Urinary retension. Brief Narrative:   Patient is a 74-year-old male with history of COPD, benign prostate hypertrophy, dyslipidemia and essential hypertension who present to the hospital with dysuria and urinary retention.  Upon arrival to the emergency room, he was found to have urinary retention, Foley catheter was anchored, drained 1 L of urine, initially with clear then become bloody.  He was placed on Rocephin and a urine culture was sent out.  His creatinine was 6.07, WBC 3.8, hemoglobin 10.9.  7/6.  Patient has more than 5 L urine output today.  Renal function stable.  Supplement magnesium.   Assessment & Plan:   Active Problems:   BPH (benign prostatic hyperplasia)   AKI (acute kidney injury) ([facility])   Acute cystitis with hematuria   COPD with chronic bronchitis ([facility])  #1. Acute renal failure secondary to obstruction due to benign prostate hypertrophy. Bilateral hydronephrosis --Appreciate nephrology consult.  --Continue Foley catheter.   --continue MIVF  2.  Urinary retention secondary to benign prostate hypertrophy. --urology consulted --Continue Foley catheter.   --continue MIVF for post-obstructive diuresis  3.  Hematuria 2/2 trauma caused by urinary retention UTI ruled out --Urine cx no significant growth D/c Rocephin  4.  COPD. Stable.  5.  Essential hypertension. Continue amlodipine.   DVT prophylaxis: Heparin Code Status: Full Family Communication:  Disposition Plan:  . Patient came from:Home       . Anticipated d/c place: Home . Barriers to d/c OR conditions which need to be met to effect a safe d/c:  Still on IVF for AKI and post-obstructive diuresis, Cr needs to improve much more before discharge.   Consultants:   Nephrology and urology.  Procedures: None Antimicrobials: Rocephin.  Subjective: Pt reported no pain,  no dysuria, doing ok.  No fever, dyspnea, N/V/D.   Objective: Vitals:   01/16/20 0900 01/16/20 0914 01/16/20 1744 01/16/20 1923  BP: (!) 154/68 (!) 154/68 (!) 146/75 131/78  Pulse: 91 91 86 91  Resp:      Temp:   97.8 F (36.6 C) 98.6 F (37 C)  TempSrc:   Oral Oral  SpO2:   97% 95%  Weight:      Height:        Intake/Output Summary (Last 24 hours) at 01/16/2020 2022 Last data filed at 01/16/2020 1700 Gross per 24 hour  Intake 3408.49 ml  Output 3750 ml  Net -341.51 ml   Filed Weights   01/14/20 0943  Weight: 65.3 kg    Examination: Constitutional: NAD, AAOx3 HEENT: conjunctivae and lids normal, EOMI CV: RRR no M,R,G. Distal pulses +2.  No cyanosis.   RESP: coarse breath sounds, normal respiratory effort  GI: +BS, NTND Extremities: No effusions, edema, or tenderness in BLE SKIN: warm, dry and intact Neuro: II - XII grossly intact.  Sensation intact Psych: Normal mood and affect.  Appropriate judgement and reason  Foley outputting pink urine.   Data Reviewed: I have personally reviewed following labs and imaging studies  CBC: Recent Labs  Lab 01/14/20 0950 01/14/20 2033 01/15/20 0402 01/16/20 0409  WBC 3.8* 7.0 6.8 5.3  NEUTROABS  --   --   --  3.2  HGB 10.9* 12.7* 11.6* 11.6*  HCT 33.6* 36.9* 34.5* 33.6*  MCV 73.0* 70.4* 70.4* 69.1*  PLT 188 227 220 217   Basic Metabolic Panel: Recent Labs  Lab 01/14/20 0950   01/15/20 0402 01/15/20 1343 01/16/20 0409  NA 140 139  --  139  K 4.5 4.9  --  4.6  CL 114* 112*  --  110  CO2 18* 18*  --  20*  GLUCOSE 78 99  --  100*  BUN 58* 53*  --  44*  CREATININE 6.07* 5.53*  --  4.27*  CALCIUM 7.5* 7.7*  --  8.3*  MG 1.0* 0.7* 2.3 1.5*   GFR: Estimated Creatinine Clearance: 12.6 mL/min (A) (by C-G formula based on SCr of 4.27 mg/dL (H)). Liver Function Tests: Recent Labs  Lab 01/14/20 0950 01/15/20 0402  AST 20 17  ALT 15 12  ALKPHOS 63 61  BILITOT 0.6 0.6  PROT 7.7 6.9  ALBUMIN 3.8 3.3*   No results  for input(s): LIPASE, AMYLASE in the last 168 hours. No results for input(s): AMMONIA in the last 168 hours. Coagulation Profile: No results for input(s): INR, PROTIME in the last 168 hours. Cardiac Enzymes: No results for input(s): CKTOTAL, CKMB, CKMBINDEX, TROPONINI in the last 168 hours. BNP (last 3 results) No results for input(s): PROBNP in the last 8760 hours. HbA1C: No results for input(s): HGBA1C in the last 72 hours. CBG: No results for input(s): GLUCAP in the last 168 hours. Lipid Profile: No results for input(s): CHOL, HDL, LDLCALC, TRIG, CHOLHDL, LDLDIRECT in the last 72 hours. Thyroid Function Tests: No results for input(s): TSH, T4TOTAL, FREET4, T3FREE, THYROIDAB in the last 72 hours. Anemia Panel: No results for input(s): VITAMINB12, FOLATE, FERRITIN, TIBC, IRON, RETICCTPCT in the last 72 hours. Sepsis Labs: No results for input(s): PROCALCITON, LATICACIDVEN in the last 168 hours.  Recent Results (from the past 240 hour(s))  Urine culture     Status: Abnormal   Collection Time: 01/14/20  9:50 AM   Specimen: Urine, Clean Catch  Result Value Ref Range Status   Specimen Description   Final    URINE, CLEAN CATCH Performed at [facility], [address] [place], [place] [address]    Special Requests   Final    NONE Performed at [facility], [address], [place], [place] [address]    Culture (A)  Final    <10,000 COLONIES/mL INSIGNIFICANT GROWTH Performed at [facility] [address], [place], [place] [address]    Report Status 01/16/2020 FINAL  Final  SARS Coronavirus 2 by RT PCR (hospital order, performed in [facility] hospital lab) Nasopharyngeal Nasopharyngeal Swab     Status: None   Collection Time: 01/14/20 12:31 PM   Specimen: Nasopharyngeal Swab  Result Value Ref Range Status   SARS Coronavirus 2 NEGATIVE NEGATIVE Final    Comment: (NOTE) SARS-CoV-2 target nucleic acids are NOT DETECTED.  The SARS-CoV-2 RNA is  generally detectable in upper and lower respiratory specimens during the acute phase of infection. The lowest concentration of SARS-CoV-2 viral copies this assay can detect is 250 copies / mL. A negative result does not preclude SARS-CoV-2 infection and should not be used as the sole basis for treatment or other patient management decisions.  A negative result may occur with improper specimen collection / handling, submission of specimen other than nasopharyngeal swab, presence of viral mutation(s) within the areas targeted by this assay, and inadequate number of viral copies (<250 copies / mL). A negative result must be combined with clinical observations, patient history, and epidemiological information.  Fact Sheet for Patients:   StrictlyIdeas.no  Fact Sheet for Healthcare Providers: BankingDealers.co.za  This test is not  yet approved or  cleared by the [place] FDA and has been authorized for detection and/or diagnosis of SARS-CoV-2 by FDA under an Emergency Use Authorization (EUA).  This EUA will remain in effect (meaning this test can be used) for the duration of the COVID-19 declaration under Section 564(b)(1) of the Act, 21 U.S.C. section 360bbb-3(b)(1), unless the authorization is terminated or revoked sooner.  Performed at [facility], [address] [place], [place] [address]          Radiology Studies: DG Chest 1 View  Result Date: 01/14/2020 CLINICAL DATA:  74-year-old male with tachycardia. EXAM: CHEST  1 VIEW COMPARISON:  Chest radiograph dated 08/06/2019. FINDINGS: Left lung base linear atelectasis/scarring. No focal consolidation, pleural effusion, or pneumothorax. The cardiac silhouette is within limits. No acute osseous pathology. Cholecystectomy clips. IMPRESSION: No active disease. Electronically Signed   By: Arash  Radparvar M.D.   On: 01/14/2020 20:34   US RENAL  Result Date:  01/15/2020 CLINICAL DATA:  Acute renal failure EXAM: RENAL / URINARY TRACT ULTRASOUND COMPLETE COMPARISON:  None. FINDINGS: Right Kidney: Renal measurements: 9.8 x 5.5 x 6.1 cm = volume: 171.3 mL. Moderate right hydronephrosis. Cortical echogenicity within normal limits. Left Kidney: Renal measurements: 8.9 x 4.9 x 4.4 cm = volume: 112.7 mL. Moderate left hydronephrosis. No mass. Cortical echogenicity within normal limits. Bladder: Decompressed by Foley catheter.  Bladder appears thick walled. Other: None. IMPRESSION: 1. Moderate bilateral hydronephrosis. 2. Decompressed urinary bladder by Foley catheter, bladder wall appears thickened. Electronically Signed   By: Kim  Fujinaga M.D.   On: 01/15/2020 16:57        Scheduled Meds: . amLODipine  10 mg Oral Daily  . atorvastatin  20 mg Oral QHS  . Chlorhexidine Gluconate Cloth  6 each Topical Daily  . fluticasone furoate-vilanterol  1 puff Inhalation Daily  . heparin  5,000 Units Subcutaneous Q8H  . pantoprazole  40 mg Oral Daily  . tamsulosin  0.4 mg Oral Daily  . tiotropium  18 mcg Inhalation Daily   Continuous Infusions: . lactated ringers Stopped (01/16/20 1638)     LOS: 2 days      Tina Lai, MD [facility]   To contact the attending provider between 7A-7P or the covering provider during after hours 7P-7A, please log into the web site [email or web address] and access using universal [place] password for that web site. If you do not have the password, please call the hospital operator.  01/16/2020, 8:22 PM    

## 2020-01-16 NOTE — Progress Notes (Signed)
[address] [place], [place] [address] Phone [phone]. Fax [phone]  Date: 01/16/2020                  Patient Name:  Donald Foster  MRN: [id number]  DOB: December 29, 1945  Age / Sex: 74 y.o., male         PCP: Elisabeth Cara, NP                 Service Requesting Consult: IM/ Enzo Bi, MD                 Reason for Consult: ARF            History of Present Illness: Patient is a 74 y.o. male with medical problems of COPD, BPH, hypertension, who was admitted to [facility] on 01/14/2020 for evaluation of dribbling, difficulty urination and incontinence for about 1 week.  In the ER he was diagnosed with severe urinary retention.  Per notes, after Foley was placed, 1 L of urine was obtained.  At night, he had some bladder pain for which he received morphine.  This morning he is pain-free.  He was also incidentally noted to have elevated creatinine of 6.07 at admission.  Nephrology consult has been requested for evaluation Initial urinalysis negative for blood or protein Repeat urinalysis at 1230 shows greater than 50 RBCs, 11-20 WBCs  Today patient is doing fair Urine output is good at 3100 cc Serum creatinine improved from 5.5-4.3 today Renal ultrasound shows bilateral moderate hydronephrosis    Current medications: Current Facility-Administered Medications  Medication Dose Route Frequency Provider Last Rate Last Admin  . albuterol (PROVENTIL) (2.5 MG/3ML) 0.083% nebulizer solution 3 mL  3 mL Inhalation Q4H PRN Sharen Hones, MD      . amLODipine (NORVASC) tablet 10 mg  10 mg Oral Daily Sharen Hones, MD   10 mg at 01/16/20 0263  . atorvastatin (LIPITOR) tablet 20 mg  20 mg Oral QHS Sharen Hones, MD   20 mg at 01/15/20 2104  . Chlorhexidine Gluconate Cloth 2 % PADS 6 each  6 each Topical Daily Sharen Hones, MD   6 each at 01/15/20 1020  . fluticasone furoate-vilanterol (BREO ELLIPTA) 100-25 MCG/INH 1 puff  1 puff Inhalation Daily Sharen Hones, MD   1 puff at 01/16/20  0926  . heparin injection 5,000 Units  5,000 Units Subcutaneous Q8H Sharen Hones, MD   5,000 Units at 01/16/20 0549  . lactated ringers infusion   Intravenous Continuous Sharen Hones, MD 125 mL/hr at 01/15/20 1903 New Bag at 01/15/20 1903  . pantoprazole (PROTONIX) EC tablet 40 mg  40 mg Oral Daily Sharen Hones, MD   40 mg at 01/16/20 7858  . tamsulosin (FLOMAX) capsule 0.4 mg  0.4 mg Oral Daily Sharen Hones, MD   0.4 mg at 01/16/20 8502  . tiotropium (SPIRIVA) inhalation capsule ([facility] use ONLY) 18 mcg  18 mcg Inhalation Daily Sharen Hones, MD   18 mcg at 01/16/20 [phone]  . traMADol (ULTRAM) tablet 50 mg  50 mg Oral Q12H PRN Sharen Hones, MD   50 mg at 01/16/20 0205      Vital Signs: Blood pressure (!) 154/68, pulse 91, temperature 98.1 F (36.7 C), temperature source Oral, resp. rate 20, height 5\' 3"  (1.6 m), weight 65.3 kg, SpO2 94 %.   Intake/Output Summary (Last 24 hours) at 01/16/2020 1307 Last data filed at 01/16/2020 0834 Gross per 24 hour  Intake 2644.89 ml  Output 3600 ml  Net -955.Cook  ml    Weight trends: Filed Weights   01/14/20 0943  Weight: 65.3 kg    Physical Exam: General:  No acute distress, laying in the bed  HEENT  moist oral mucous membranes, anicteric  Neck:  Supple, no JVD, no masses  Lungs:  Normal breathing effort, clear to auscultation  Heart::  Regular, no rub or gallop  Abdomen:  Soft, nontender  Extremities:  No peripheral edema  Neurologic:  Alert, oriented  Skin:  Warm, dry  Foley:  In place with blood-tinged urine       Lab results: Basic Metabolic Panel: Recent Labs  Lab 01/14/20 0950 01/14/20 0950 01/15/20 0402 01/15/20 1343 01/16/20 0409  NA 140  --  139  --  139  K 4.5  --  4.9  --  4.6  CL 114*  --  112*  --  110  CO2 18*  --  18*  --  20*  GLUCOSE 78  --  99  --  100*  BUN 58*  --  53*  --  44*  CREATININE 6.07*  --  5.53*  --  4.27*  CALCIUM 7.5*  --  7.7*  --  8.3*  MG 1.0*   < > 0.7* 2.3 1.5*   < > = values in this interval  not displayed.    Liver Function Tests: Recent Labs  Lab 01/15/20 0402  AST 17  ALT 12  ALKPHOS 61  BILITOT 0.6  PROT 6.9  ALBUMIN 3.3*   No results for input(s): LIPASE, AMYLASE in the last 168 hours. No results for input(s): AMMONIA in the last 168 hours.  CBC: Recent Labs  Lab 01/15/20 0402 01/16/20 0409  WBC 6.8 5.3  NEUTROABS  --  3.2  HGB 11.6* 11.6*  HCT 34.5* 33.6*  MCV 70.4* 69.1*  PLT 220 217    Cardiac Enzymes: No results for input(s): CKTOTAL, TROPONINI in the last 168 hours.  BNP: Invalid input(s): POCBNP  CBG: No results for input(s): GLUCAP in the last 168 hours.  Microbiology: Recent Results (from the past 720 hour(s))  Urine culture     Status: Abnormal   Collection Time: 01/14/20  9:50 AM   Specimen: Urine, Clean Catch  Result Value Ref Range Status   Specimen Description   Final    URINE, CLEAN CATCH Performed at [facility], [address], [place], [place] [address]    Special Requests   Final    NONE Performed at [facility], [address] [place], [place] [address]    Culture (A)  Final    <10,000 COLONIES/mL INSIGNIFICANT GROWTH Performed at [facility] [address], [place], [place] [address]    Report Status 01/16/2020 FINAL  Final  SARS Coronavirus 2 by RT PCR (hospital order, performed in [facility] hospital lab) Nasopharyngeal Nasopharyngeal Swab     Status: None   Collection Time: 01/14/20 12:31 PM   Specimen: Nasopharyngeal Swab  Result Value Ref Range Status   SARS Coronavirus 2 NEGATIVE NEGATIVE Final    Comment: (NOTE) SARS-CoV-2 target nucleic acids are NOT DETECTED.  The SARS-CoV-2 RNA is generally detectable in upper and lower respiratory specimens during the acute phase of infection. The lowest concentration of SARS-CoV-2 viral copies this assay can detect is 250 copies / mL. A negative result does not preclude SARS-CoV-2 infection and should not be used as the sole basis for  treatment or other patient management decisions.  A negative result may occur  with improper specimen collection / handling, submission of specimen other than nasopharyngeal swab, presence of viral mutation(s) within the areas targeted by this assay, and inadequate number of viral copies (<250 copies / mL). A negative result must be combined with clinical observations, patient history, and epidemiological information.  Fact Sheet for Patients:   StrictlyIdeas.no  Fact Sheet for Healthcare Providers: BankingDealers.co.za  This test is not yet approved or  cleared by the [place] FDA and has been authorized for detection and/or diagnosis of SARS-CoV-2 by FDA under an Emergency Use Authorization (EUA).  This EUA will remain in effect (meaning this test can be used) for the duration of the COVID-19 declaration under Section 564(b)(1) of the Act, 21 U.S.C. section 360bbb-3(b)(1), unless the authorization is terminated or revoked sooner.  Performed at [facility], [address] [place], [place] [address]      Coagulation Studies: No results for input(s): LABPROT, INR in the last 72 hours.  Urinalysis: Recent Labs    01/14/20 0950 01/14/20 1231  COLORURINE STRAW* RED*  LABSPEC 1.006 1.012  PHURINE 5.0 TEST NOT REPORTED DUE TO COLOR INTERFERENCE OF URINE PIGMENT  GLUCOSEU NEGATIVE TEST NOT REPORTED DUE TO COLOR INTERFERENCE OF URINE PIGMENT*  HGBUR NEGATIVE TEST NOT REPORTED DUE TO COLOR INTERFERENCE OF URINE PIGMENT*  BILIRUBINUR NEGATIVE TEST NOT REPORTED DUE TO COLOR INTERFERENCE OF URINE PIGMENT*  KETONESUR NEGATIVE TEST NOT REPORTED DUE TO COLOR INTERFERENCE OF URINE PIGMENT*  PROTEINUR NEGATIVE TEST NOT REPORTED DUE TO COLOR INTERFERENCE OF URINE PIGMENT*  NITRITE NEGATIVE TEST NOT REPORTED DUE TO COLOR INTERFERENCE OF URINE PIGMENT*  LEUKOCYTESUR NEGATIVE TEST NOT REPORTED DUE TO COLOR INTERFERENCE OF URINE  PIGMENT*        Imaging: DG Chest 1 View  Result Date: 01/14/2020 CLINICAL DATA:  74 year old male with tachycardia. EXAM: CHEST  1 VIEW COMPARISON:  Chest radiograph dated 08/06/2019. FINDINGS: Left lung base linear atelectasis/scarring. No focal consolidation, pleural effusion, or pneumothorax. The cardiac silhouette is within limits. No acute osseous pathology. Cholecystectomy clips. IMPRESSION: No active disease. Electronically Signed   By: Anner Crete M.D.   On: 01/14/2020 20:34   US RENAL  Result Date: 01/15/2020 CLINICAL DATA:  Acute renal failure EXAM: RENAL / URINARY TRACT ULTRASOUND COMPLETE COMPARISON:  None. FINDINGS: Right Kidney: Renal measurements: 9.8 x 5.5 x 6.1 cm = volume: 171.3 mL. Moderate right hydronephrosis. Cortical echogenicity within normal limits. Left Kidney: Renal measurements: 8.9 x 4.9 x 4.4 cm = volume: 112.7 mL. Moderate left hydronephrosis. No mass. Cortical echogenicity within normal limits. Bladder: Decompressed by Foley catheter.  Bladder appears thick walled. Other: None. IMPRESSION: 1. Moderate bilateral hydronephrosis. 2. Decompressed urinary bladder by Foley catheter, bladder wall appears thickened. Electronically Signed   By: Donavan Foil M.D.   On: 01/15/2020 16:57     Assessment & Plan: Pt is a 74 y.o. African-American  male with COPD, BPH, hypertension, was admitted on 01/14/2020 with Urinary retention [R33.9] AKI (acute kidney injury) ([place]) [N17.9] Urinary tract infection with hematuria, site unspecified [N39.0, R31.9]  #.Acute kidney injury #Chronic kidney disease stage IIIa GFR 52-60 in January 2021 #Acute urinary retention with moderate bilateral hydronephrosis #History of BPH #Gross hematuria  Acute kidney injury is likely secondary to acute urinary retention.  Hematuria may be due to Foley placement trauma or rapid bladder decompression. Patient has had good urine output and serum creatinine is improving satisfactorily Appreciate  urology evaluation Renal ultrasound shows moderate bilateral hydronephrosis Follow Creatinine trends. Expected to improve close  to baseline. Electrolytes and volume status are acceptable.  No acute indication for dialysis at present. Follow urology recommendations and outpatient follow-up     LOS: 2 Smrithi Pigford 7/7/20211:07 PM    Note: This note was prepared with Dragon dictation. Any transcription errors are unintentional

## 2020-01-17 LAB — IRON AND TIBC
Iron: 62 ug/dL (ref 45–182)
Saturation Ratios: 27 % (ref 17.9–39.5)
TIBC: 227 ug/dL — ABNORMAL LOW (ref 250–450)
UIBC: 165 ug/dL

## 2020-01-17 LAB — BASIC METABOLIC PANEL
Anion gap: 8 (ref 5–15)
BUN: 36 mg/dL — ABNORMAL HIGH (ref 8–23)
CO2: 24 mmol/L (ref 22–32)
Calcium: 8.6 mg/dL — ABNORMAL LOW (ref 8.9–10.3)
Chloride: 106 mmol/L (ref 98–111)
Creatinine, Ser: 3.51 mg/dL — ABNORMAL HIGH (ref 0.61–1.24)
GFR calc Af Amer: 19 mL/min — ABNORMAL LOW (ref >60)
GFR calc non Af Amer: 16 mL/min — ABNORMAL LOW (ref >60)
Glucose, Bld: 112 mg/dL — ABNORMAL HIGH (ref 70–99)
Potassium: 4.1 mmol/L (ref 3.5–5.1)
Sodium: 138 mmol/L (ref 135–145)

## 2020-01-17 LAB — CBC
HCT: 33.2 % — ABNORMAL LOW (ref 39.0–52.0)
Hemoglobin: 11.1 g/dL — ABNORMAL LOW (ref 13.0–17.0)
MCH: 23.9 pg — ABNORMAL LOW (ref 26.0–34.0)
MCHC: 33.4 g/dL (ref 30.0–36.0)
MCV: 71.4 fL — ABNORMAL LOW (ref 80.0–100.0)
Platelets: 209 10*3/uL (ref 150–400)
RBC: 4.65 MIL/uL (ref 4.22–5.81)
RDW: 17.2 % — ABNORMAL HIGH (ref 11.5–15.5)
WBC: 5.6 10*3/uL (ref 4.0–10.5)
nRBC: 0 % (ref 0.0–0.2)

## 2020-01-17 LAB — MAGNESIUM: Magnesium: 1.6 mg/dL — ABNORMAL LOW (ref 1.7–2.4)

## 2020-01-17 MED ORDER — MAGNESIUM SULFATE 2 GM/50ML IV SOLN
2.0000 g | Freq: Once | INTRAVENOUS | Status: AC
  Administered 2020-01-17: 2 g via INTRAVENOUS
  Filled 2020-01-17: qty 50

## 2020-01-17 MED ORDER — POLYETHYLENE GLYCOL 3350 17 G PO PACK
34.0000 g | PACK | ORAL | Status: AC
  Administered 2020-01-17: 34 g via ORAL
  Filled 2020-01-17 (×3): qty 2

## 2020-01-17 NOTE — Progress Notes (Signed)
PROGRESS NOTE    Donald Foster  FPO:[id number] DOB: [birth year] DOA: 01/14/2020 PCP: Elisabeth Cara, NP   Chief complaint.  Urinary retension. Brief Narrative:   Patient is a 74 year old male with history of COPD, benign prostate hypertrophy, dyslipidemia and essential hypertension who present to the hospital with dysuria and urinary retention.  Upon arrival to the emergency room, he was found to have urinary retention, Foley catheter was anchored, drained 1 L of urine, initially with clear then become bloody.  He was placed on Rocephin and a urine culture was sent out.  His creatinine was 6.07, WBC 3.8, hemoglobin 10.9.  7/6.  Patient has more than 5 L urine output today.  Renal function stable.  Supplement magnesium.   Assessment & Plan:   Active Problems:   BPH (benign prostatic hyperplasia)   AKI (acute kidney injury) ([place])   Acute cystitis with hematuria   COPD with chronic bronchitis ([place])  #1. Acute renal failure secondary to urinary obstruction  Bilateral hydronephrosis --Appreciate nephrology consult.  --Continue Foley catheter.   --continue MIVF  2.  Urinary retention secondary to benign prostate hypertrophy. --urology consulted --Continue Foley catheter.   --continue MIVF for post-obstructive diuresis  3.  Hematuria 2/2 trauma caused by urinary retention UTI ruled out --Urine cx no significant growth D/c Rocephin  4.  COPD. Stable.  5.  Essential hypertension. Continue amlodipine.   DVT prophylaxis: SCD given hematuria Code Status: Full Family Communication:  Disposition Plan:   Patient came from:Home        Anticipated d/c place: Home  Barriers to d/c OR conditions which need to be met to effect a safe d/c:  Still on IVF for AKI and post-obstructive diuresis, Cr needs to improve much more before discharge.  Hematuria also needs to clear.   Consultants:   Nephrology and urology.  Procedures: None Antimicrobials: Rocephin.  Subjective: Pt  reported no more bladder spasm.  No fever.  Urine still bloody.   Objective: Vitals:   01/16/20 1923 01/17/20 0407 01/17/20 1015 01/17/20 1214  BP: 131/78 (!) 146/82 (!) 155/92 133/75  Pulse: 91 100 91 90  Resp: 18 18 16 16   Temp: 98.6 F (37 C) 97.9 F (36.6 C) 98 F (36.7 C) 97.6 F (36.4 C)  TempSrc: Oral Oral Oral Oral  SpO2: 95% 92% 95% 93%  Weight:      Height:        Intake/Output Summary (Last 24 hours) at 01/17/2020 1759 Last data filed at 01/17/2020 1650 Gross per 24 hour  Intake 1754.13 ml  Output 3750 ml  Net -1995.87 ml   Filed Weights   01/14/20 0943  Weight: 65.3 kg    Examination: Constitutional: NAD, AAOx3 HEENT: conjunctivae and lids normal, EOMI CV: RRR no M,R,G. Distal pulses +2.  No cyanosis.   RESP: clear breath sounds, normal respiratory effort  GI: +BS, NTND Extremities: No effusions, edema, or tenderness in BLE SKIN: warm, dry and intact Neuro: II - XII grossly intact.  Sensation intact Psych: Normal mood and affect.  Appropriate judgement and reason  Foley outputting bloody urine.   Data Reviewed: I have personally reviewed following labs and imaging studies  CBC: Recent Labs  Lab 01/14/20 0950 01/14/20 2033 01/15/20 0402 01/16/20 0409 01/17/20 0533  WBC 3.8* 7.0 6.8 5.3 5.6  NEUTROABS  --   --   --  3.2  --   HGB 10.9* 12.7* 11.6* 11.6* 11.1*  HCT 33.6* 36.9* 34.5* 33.6* 33.2*  MCV 73.0* 70.4* 70.4*  69.1* 71.4*  PLT 188 227 220 217 161   Basic Metabolic Panel: Recent Labs  Lab 01/14/20 0950 01/15/20 0402 01/15/20 1343 01/16/20 0409 01/17/20 0533  NA 140 139  --  139 138  K 4.5 4.9  --  4.6 4.1  CL 114* 112*  --  110 106  CO2 18* 18*  --  20* 24  GLUCOSE 78 99  --  100* 112*  BUN 58* 53*  --  44* 36*  CREATININE 6.07* 5.53*  --  4.27* 3.51*  CALCIUM 7.5* 7.7*  --  8.3* 8.6*  MG 1.0* 0.7* 2.3 1.5* 1.6*   GFR: Estimated Creatinine Clearance: 15.3 mL/min (A) (by C-G formula based on SCr of 3.51 mg/dL (H)). Liver  Function Tests: Recent Labs  Lab 01/14/20 0950 01/15/20 0402  AST 20 17  ALT 15 12  ALKPHOS 63 61  BILITOT 0.6 0.6  PROT 7.7 6.9  ALBUMIN 3.8 3.3*   No results for input(s): LIPASE, AMYLASE in the last 168 hours. No results for input(s): AMMONIA in the last 168 hours. Coagulation Profile: No results for input(s): INR, PROTIME in the last 168 hours. Cardiac Enzymes: No results for input(s): CKTOTAL, CKMB, CKMBINDEX, TROPONINI in the last 168 hours. BNP (last 3 results) No results for input(s): PROBNP in the last 8760 hours. HbA1C: No results for input(s): HGBA1C in the last 72 hours. CBG: No results for input(s): GLUCAP in the last 168 hours. Lipid Profile: No results for input(s): CHOL, HDL, LDLCALC, TRIG, CHOLHDL, LDLDIRECT in the last 72 hours. Thyroid Function Tests: No results for input(s): TSH, T4TOTAL, FREET4, T3FREE, THYROIDAB in the last 72 hours. Anemia Panel: Recent Labs    01/17/20 0533  TIBC 227*  IRON 62   Sepsis Labs: No results for input(s): PROCALCITON, LATICACIDVEN in the last 168 hours.  Recent Results (from the past 240 hour(s))  Urine culture     Status: Abnormal   Collection Time: 01/14/20  9:50 AM   Specimen: Urine, Clean Catch  Result Value Ref Range Status   Specimen Description   Final    URINE, CLEAN CATCH Performed at [facility], [address], [place], [place] [address]    Special Requests   Final    NONE Performed at [facility], [address], [place], [place] [address]    Culture (A)  Final    <10,000 COLONIES/mL INSIGNIFICANT GROWTH Performed at [facility] [address], [place], [place] [address]    Report Status 01/16/2020 FINAL  Final  SARS Coronavirus 2 by RT PCR (hospital order, performed in [facility] hospital lab) Nasopharyngeal Nasopharyngeal Swab     Status: None   Collection Time: 01/14/20 12:31 PM   Specimen: Nasopharyngeal Swab  Result Value Ref Range Status   SARS Coronavirus 2  NEGATIVE NEGATIVE Final    Comment: (NOTE) SARS-CoV-2 target nucleic acids are NOT DETECTED.  The SARS-CoV-2 RNA is generally detectable in upper and lower respiratory specimens during the acute phase of infection. The lowest concentration of SARS-CoV-2 viral copies this assay can detect is 250 copies / mL. A negative result does not preclude SARS-CoV-2 infection and should not be used as the sole basis for treatment or other patient management decisions.  A negative result may occur with improper specimen collection / handling, submission of specimen other than nasopharyngeal swab, presence of viral mutation(s) within the areas targeted by this assay, and inadequate number of viral copies (<250 copies / mL). A negative result  must be combined with clinical observations, patient history, and epidemiological information.  Fact Sheet for Patients:   StrictlyIdeas.no  Fact Sheet for Healthcare Providers: BankingDealers.co.za  This test is not yet approved or  cleared by the [place] FDA and has been authorized for detection and/or diagnosis of SARS-CoV-2 by FDA under an Emergency Use Authorization (EUA).  This EUA will remain in effect (meaning this test can be used) for the duration of the COVID-19 declaration under Section 564(b)(1) of the Act, 21 U.S.C. section 360bbb-3(b)(1), unless the authorization is terminated or revoked sooner.  Performed at [facility], [address], [place], [place] [address]          Radiology Studies: No results found.      Scheduled Meds:  amLODipine  10 mg Oral Daily   atorvastatin  20 mg Oral QHS   Chlorhexidine Gluconate Cloth  6 each Topical Daily   fluticasone furoate-vilanterol  1 puff Inhalation Daily   pantoprazole  40 mg Oral Daily   polyethylene glycol  34 g Oral Q2H   tamsulosin  0.4 mg Oral Daily   tiotropium  18 mcg Inhalation Daily   Continuous  Infusions:  lactated ringers 125 mL/hr at 01/17/20 1708     LOS: 3 days      Enzo Bi, MD [facility]   To contact the attending provider between 7A-7P or the covering provider during after hours 7P-7A, please log into the web site [email or web address] and access using universal [place] password for that web site. If you do not have the password, please call the hospital operator.  01/17/2020, 5:59 PM

## 2020-01-17 NOTE — Progress Notes (Signed)
Urology Inpatient Progress Note  Subjective: Renal ultrasound with moderate bilateral hydronephrosis, no prior films available for comparison.  Study also notable for bladder wall thickening consistent with BOO. Creatinine downtrending, today 3.51.  UOP 3100 mL yesterday. Foley catheter in place draining yellow urine. Urine culture resulted with insignificant growth. Patient reports lower abdominal pain associated with the urge to urinate around 2 AM this morning.  He says this has happened twice since Foley placement.  He reports no bowel movements since admission.  Anti-infectives: Anti-infectives (From admission, onward)   Start     Dose/Rate Route Frequency Ordered Stop   01/15/20 1400  cefTRIAXone (ROCEPHIN) 1 g in sodium chloride 0.9 % 100 mL IVPB  Status:  Discontinued        1 g 200 mL/hr over 30 Minutes Intravenous Every 24 hours 01/14/20 1506 01/16/20 1052   01/14/20 1445  cefTRIAXone (ROCEPHIN) 1 g in sodium chloride 0.9 % 100 mL IVPB        1 g 200 mL/hr over 30 Minutes Intravenous  Once 01/14/20 1435 01/14/20 1618      Current Facility-Administered Medications  Medication Dose Route Frequency Provider Last Rate Last Admin  . albuterol (PROVENTIL) (2.5 MG/3ML) 0.083% nebulizer solution 3 mL  3 mL Inhalation Q4H PRN Sharen Hones, MD      . amLODipine (NORVASC) tablet 10 mg  10 mg Oral Daily Sharen Hones, MD   10 mg at 01/16/20 9166  . atorvastatin (LIPITOR) tablet 20 mg  20 mg Oral QHS Sharen Hones, MD   20 mg at 01/16/20 2111  . Chlorhexidine Gluconate Cloth 2 % PADS 6 each  6 each Topical Daily Sharen Hones, MD   6 each at 01/16/20 1000  . fluticasone furoate-vilanterol (BREO ELLIPTA) 100-25 MCG/INH 1 puff  1 puff Inhalation Daily Sharen Hones, MD   1 puff at 01/16/20 0926  . lactated ringers infusion   Intravenous Continuous Sharen Hones, MD 125 mL/hr at 01/17/20 0030 New Bag at 01/17/20 0030  . oxyCODONE (Oxy IR/ROXICODONE) immediate release tablet 5 mg  5 mg Oral Q6H PRN  Lang Snow, NP   5 mg at 01/17/20 0306  . pantoprazole (PROTONIX) EC tablet 40 mg  40 mg Oral Daily Sharen Hones, MD   40 mg at 01/16/20 0600  . tamsulosin (FLOMAX) capsule 0.4 mg  0.4 mg Oral Daily Sharen Hones, MD   0.4 mg at 01/16/20 4599  . tiotropium (SPIRIVA) inhalation capsule ([facility] use ONLY) 18 mcg  18 mcg Inhalation Daily Sharen Hones, MD   18 mcg at 01/16/20 [phone]  . traMADol (ULTRAM) tablet 50 mg  50 mg Oral Q12H PRN Lang Snow, NP         Objective: Vital signs in last 24 hours: Temp:  [97.8 F (36.6 C)-98.6 F (37 C)] 97.9 F (36.6 C) (07/08 0407) Pulse Rate:  [86-100] 100 (07/08 0407) Resp:  [18] 18 (07/08 0407) BP: (131-154)/(68-82) 146/82 (07/08 0407) SpO2:  [92 %-97 %] 92 % (07/08 0407)  Intake/Output from previous day: 07/07 0701 - 07/08 0700 In: 1770.2 [P.O.:600; I.V.:1170.2] Out: 3100 [Urine:3100] Intake/Output this shift: Total I/O In: -  Out: 700 [Urine:700]  Physical Exam Vitals and nursing note reviewed.  Constitutional:      General: He is not in acute distress.    Appearance: He is not ill-appearing, toxic-appearing or diaphoretic.  HENT:     Head: Normocephalic and atraumatic.  Pulmonary:     Effort: Pulmonary effort is normal. No respiratory distress.  Skin:    General: Skin is warm and dry.  Neurological:     Mental Status: He is alert and oriented to person, place, and time.  Psychiatric:        Mood and Affect: Mood normal.        Behavior: Behavior normal.    Lab Results:  Recent Labs    01/16/20 0409 01/17/20 0533  WBC 5.3 5.6  HGB 11.6* 11.1*  HCT 33.6* 33.2*  PLT 217 209   BMET Recent Labs    01/16/20 0409 01/17/20 0533  NA 139 138  K 4.6 4.1  CL 110 106  CO2 20* 24  GLUCOSE 100* 112*  BUN 44* 36*  CREATININE 4.27* 3.51*  CALCIUM 8.3* 8.6*   Studies/Results: US RENAL  Result Date: 01/15/2020 CLINICAL DATA:  Acute renal failure EXAM: RENAL / URINARY TRACT ULTRASOUND COMPLETE COMPARISON:   None. FINDINGS: Right Kidney: Renal measurements: 9.8 x 5.5 x 6.1 cm = volume: 171.3 mL. Moderate right hydronephrosis. Cortical echogenicity within normal limits. Left Kidney: Renal measurements: 8.9 x 4.9 x 4.4 cm = volume: 112.7 mL. Moderate left hydronephrosis. No mass. Cortical echogenicity within normal limits. Bladder: Decompressed by Foley catheter.  Bladder appears thick walled. Other: None. IMPRESSION: 1. Moderate bilateral hydronephrosis. 2. Decompressed urinary bladder by Foley catheter, bladder wall appears thickened. Electronically Signed   By: Donavan Foil M.D.   On: 01/15/2020 16:57   Assessment & Plan: 74 year old male with PMH BPH on Flomax admitted for acute urinary retention with acute renal failure.  He developed gross hematuria and postobstructive diuresis following Foley catheter placement.  Hematuria resolved, UOP decreasing.  Creatinine continues to downtrend.  Moderate bilateral hydronephrosis on renal ultrasound, no comparison film available.  Recommend outpatient follow-up renal ultrasound in 1 month to evaluate for persistence versus resolution.  Urine culture negative, okay to discontinue antibiotics.  Keep Foley pending outpatient voiding trial next week.  Continue Flomax.  He does report bladder spasms, however do not recommend anticholinergics given his continued constipation.  Recommend aggressive bowel regimen, as this may be contributory to his symptoms.  Debroah Loop, PA-C 01/17/2020

## 2020-01-17 NOTE — Care Management Important Message (Signed)
Important Message  Patient Details  Name: Donald Foster MRN: [id number] Date of Birth: 09/07/1945   Medicare Important Message Given:  Yes     Dannette Barbara 01/17/2020, 1:16 PM

## 2020-01-17 NOTE — Progress Notes (Signed)
[address] [place], [place] [address] Phone [phone]. Fax [phone]  Date: 01/17/2020                  Patient Name:  Donald Foster  MRN: [id number]  DOB: May 12, 1946  Age / Sex: 74 y.o., male         PCP: Elisabeth Cara, NP                 Service Requesting Consult: IM/ Enzo Bi, MD                 Reason for Consult: ARF            History of Present Illness: Patient is a 74 y.o. male with medical problems of COPD, BPH, hypertension, who was admitted to [facility] on 01/14/2020 for evaluation of dribbling, difficulty urination and incontinence for about 1 week.  In the ER he was diagnosed with severe urinary retention.  Per notes, after Foley was placed, 1 L of urine was obtained.  At night, he had some bladder pain for which he received morphine.  This morning he is pain-free.  He was also incidentally noted to have elevated creatinine of 6.07 at admission.  Nephrology consult has been requested for evaluation Initial urinalysis negative for blood or protein Repeat urinalysis at 1230 shows greater than 50 RBCs, 11-20 WBCs  Today patient is doing fair Urine output is good at 3100 cc Serum creatinine trend is improving Renal ultrasound shows bilateral moderate hydronephrosis Patient reports bladder spasms at night.  Resolves after getting pain medications. Able to eat.  Nauseous this morning.   Current medications: Current Facility-Administered Medications  Medication Dose Route Frequency Provider Last Rate Last Admin  . albuterol (PROVENTIL) (2.5 MG/3ML) 0.083% nebulizer solution 3 mL  3 mL Inhalation Q4H PRN Sharen Hones, MD      . amLODipine (NORVASC) tablet 10 mg  10 mg Oral Daily Sharen Hones, MD   10 mg at 01/17/20 1059  . atorvastatin (LIPITOR) tablet 20 mg  20 mg Oral QHS Sharen Hones, MD   20 mg at 01/16/20 2111  . Chlorhexidine Gluconate Cloth 2 % PADS 6 each  6 each Topical Daily Sharen Hones, MD   6 each at 01/17/20 1105  . fluticasone  furoate-vilanterol (BREO ELLIPTA) 100-25 MCG/INH 1 puff  1 puff Inhalation Daily Sharen Hones, MD   1 puff at 01/16/20 0926  . lactated ringers infusion   Intravenous Continuous Sharen Hones, MD   Stopped at 01/17/20 [phone]  . oxyCODONE (Oxy IR/ROXICODONE) immediate release tablet 5 mg  5 mg Oral Q6H PRN Lang Snow, NP   5 mg at 01/17/20 0306  . pantoprazole (PROTONIX) EC tablet 40 mg  40 mg Oral Daily Sharen Hones, MD   40 mg at 01/17/20 1100  . polyethylene glycol (MIRALAX / GLYCOLAX) packet 34 g  34 g Oral Einar Gip, MD   34 g at 01/17/20 1115  . tamsulosin (FLOMAX) capsule 0.4 mg  0.4 mg Oral Daily Sharen Hones, MD   0.4 mg at 01/17/20 1059  . tiotropium (SPIRIVA) inhalation capsule ([facility] use ONLY) 18 mcg  18 mcg Inhalation Daily Sharen Hones, MD   18 mcg at 01/16/20 [phone]  . traMADol (ULTRAM) tablet 50 mg  50 mg Oral Q12H PRN Lang Snow, NP          Vital Signs: Blood pressure (!) 155/92, pulse 91, temperature 98 F (36.7 C), temperature source Oral, resp. rate 16,  height 5\' 3"  (1.6 m), weight 65.3 kg, SpO2 95 %.   Intake/Output Summary (Last 24 hours) at 01/17/2020 1211 Last data filed at 01/17/2020 0900 Gross per 24 hour  Intake 2684.35 ml  Output 3300 ml  Net -615.65 ml    Weight trends: [employer]   01/14/20 0943  Weight: 65.3 kg    Physical Exam: General:  No acute distress, laying in the bed  HEENT  moist oral mucous membranes, anicteric  Neck:  Supple, no JVD, no masses  Lungs:  Normal breathing effort, clear to auscultation  Heart::  Regular, no rub or gallop  Abdomen:  Soft, nontender  Extremities:  No peripheral edema  Neurologic:  Alert, oriented  Skin:  Warm, dry  Foley:  In place with blood-tinged urine       Lab results: Basic Metabolic Panel: Recent Labs  Lab 01/15/20 0402 01/15/20 0402 01/15/20 1343 01/16/20 0409 01/17/20 0533  NA 139  --   --  139 138  K 4.9  --   --  4.6 4.1  CL 112*  --   --  110 106  CO2 18*   --   --  20* 24  GLUCOSE 99  --   --  100* 112*  BUN 53*  --   --  44* 36*  CREATININE 5.53*  --   --  4.27* 3.51*  CALCIUM 7.7*  --   --  8.3* 8.6*  MG 0.7*   < > 2.3 1.5* 1.6*   < > = values in this interval not displayed.    Liver Function Tests: Recent Labs  Lab 01/15/20 0402  AST 17  ALT 12  ALKPHOS 61  BILITOT 0.6  PROT 6.9  ALBUMIN 3.3*   No results for input(s): LIPASE, AMYLASE in the last 168 hours. No results for input(s): AMMONIA in the last 168 hours.  CBC: Recent Labs  Lab 01/16/20 0409 01/17/20 0533  WBC 5.3 5.6  NEUTROABS 3.2  --   HGB 11.6* 11.1*  HCT 33.6* 33.2*  MCV 69.1* 71.4*  PLT 217 209    Cardiac Enzymes: No results for input(s): CKTOTAL, TROPONINI in the last 168 hours.  BNP: Invalid input(s): POCBNP  CBG: No results for input(s): GLUCAP in the last 168 hours.  Microbiology: Recent Results (from the past 720 hour(s))  Urine culture     Status: Abnormal   Collection Time: 01/14/20  9:50 AM   Specimen: Urine, Clean Catch  Result Value Ref Range Status   Specimen Description   Final    URINE, CLEAN CATCH Performed at [facility], [address], [place], [place] [address]    Special Requests   Final    NONE Performed at [facility], [address], [place], [place] [address]    Culture (A)  Final    <10,000 COLONIES/mL INSIGNIFICANT GROWTH Performed at [facility] [address], [place], [place] [address]    Report Status 01/16/2020 FINAL  Final  SARS Coronavirus 2 by RT PCR (hospital order, performed in [facility] hospital lab) Nasopharyngeal Nasopharyngeal Swab     Status: None   Collection Time: 01/14/20 12:31 PM   Specimen: Nasopharyngeal Swab  Result Value Ref Range Status   SARS Coronavirus 2 NEGATIVE NEGATIVE Final    Comment: (NOTE) SARS-CoV-2 target nucleic acids are NOT DETECTED.  The SARS-CoV-2 RNA is generally detectable in upper and lower respiratory specimens during the acute  phase of infection. The lowest concentration of  SARS-CoV-2 viral copies this assay can detect is 250 copies / mL. A negative result does not preclude SARS-CoV-2 infection and should not be used as the sole basis for treatment or other patient management decisions.  A negative result may occur with improper specimen collection / handling, submission of specimen other than nasopharyngeal swab, presence of viral mutation(s) within the areas targeted by this assay, and inadequate number of viral copies (<250 copies / mL). A negative result must be combined with clinical observations, patient history, and epidemiological information.  Fact Sheet for Patients:   StrictlyIdeas.no  Fact Sheet for Healthcare Providers: BankingDealers.co.za  This test is not yet approved or  cleared by the [place] FDA and has been authorized for detection and/or diagnosis of SARS-CoV-2 by FDA under an Emergency Use Authorization (EUA).  This EUA will remain in effect (meaning this test can be used) for the duration of the COVID-19 declaration under Section 564(b)(1) of the Act, 21 U.S.C. section 360bbb-3(b)(1), unless the authorization is terminated or revoked sooner.  Performed at [facility], [address] [place], [place] [address]      Coagulation Studies: No results for input(s): LABPROT, INR in the last 72 hours.  Urinalysis: Recent Labs    01/14/20 1231  COLORURINE RED*  LABSPEC 1.012  PHURINE TEST NOT REPORTED DUE TO COLOR INTERFERENCE OF URINE PIGMENT  GLUCOSEU TEST NOT REPORTED DUE TO COLOR INTERFERENCE OF URINE PIGMENT*  HGBUR TEST NOT REPORTED DUE TO COLOR INTERFERENCE OF URINE PIGMENT*  BILIRUBINUR TEST NOT REPORTED DUE TO COLOR INTERFERENCE OF URINE PIGMENT*  KETONESUR TEST NOT REPORTED DUE TO COLOR INTERFERENCE OF URINE PIGMENT*  PROTEINUR TEST NOT REPORTED DUE TO COLOR INTERFERENCE OF URINE PIGMENT*  NITRITE TEST NOT  REPORTED DUE TO COLOR INTERFERENCE OF URINE PIGMENT*  LEUKOCYTESUR TEST NOT REPORTED DUE TO COLOR INTERFERENCE OF URINE PIGMENT*        Imaging: US RENAL  Result Date: 01/15/2020 CLINICAL DATA:  Acute renal failure EXAM: RENAL / URINARY TRACT ULTRASOUND COMPLETE COMPARISON:  None. FINDINGS: Right Kidney: Renal measurements: 9.8 x 5.5 x 6.1 cm = volume: 171.3 mL. Moderate right hydronephrosis. Cortical echogenicity within normal limits. Left Kidney: Renal measurements: 8.9 x 4.9 x 4.4 cm = volume: 112.7 mL. Moderate left hydronephrosis. No mass. Cortical echogenicity within normal limits. Bladder: Decompressed by Foley catheter.  Bladder appears thick walled. Other: None. IMPRESSION: 1. Moderate bilateral hydronephrosis. 2. Decompressed urinary bladder by Foley catheter, bladder wall appears thickened. Electronically Signed   By: Donavan Foil M.D.   On: 01/15/2020 16:57     Assessment & Plan: Pt is a 74 y.o. African-American  male with COPD, BPH, hypertension, was admitted on 01/14/2020 with Urinary retention [R33.9] AKI (acute kidney injury) ([place]) [N17.9] Urinary tract infection with hematuria, site unspecified [N39.0, R31.9]  #.Acute kidney injury #Chronic kidney disease stage IIIa GFR 52-60 in January 2021 #Acute urinary retention with moderate bilateral hydronephrosis #History of BPH #Gross hematuria  Acute kidney injury is likely secondary to acute urinary retention.  Hematuria may be due to Foley placement trauma or rapid bladder decompression. Patient has had good urine output and serum creatinine is improving satisfactorily Appreciate urology evaluation Renal ultrasound shows moderate bilateral hydronephrosis Follow Creatinine trends. Expected to improve close to baseline. Electrolytes and volume status are acceptable.  No acute indication for dialysis at present. Follow urology recommendations and outpatient follow-up     LOS: 3 Dorsie Burich 7/8/202112:11 PM    Note:  This note was prepared with  Sales executive. Any transcription errors are unintentional

## 2020-01-18 LAB — CBC
HCT: 33.8 % — ABNORMAL LOW (ref 39.0–52.0)
Hemoglobin: 11.6 g/dL — ABNORMAL LOW (ref 13.0–17.0)
MCH: 24.3 pg — ABNORMAL LOW (ref 26.0–34.0)
MCHC: 34.3 g/dL (ref 30.0–36.0)
MCV: 70.9 fL — ABNORMAL LOW (ref 80.0–100.0)
Platelets: 206 10*3/uL (ref 150–400)
RBC: 4.77 MIL/uL (ref 4.22–5.81)
RDW: 17.4 % — ABNORMAL HIGH (ref 11.5–15.5)
WBC: 5.6 10*3/uL (ref 4.0–10.5)
nRBC: 0 % (ref 0.0–0.2)

## 2020-01-18 LAB — BASIC METABOLIC PANEL
Anion gap: 7 (ref 5–15)
BUN: 28 mg/dL — ABNORMAL HIGH (ref 8–23)
CO2: 27 mmol/L (ref 22–32)
Calcium: 8.9 mg/dL (ref 8.9–10.3)
Chloride: 104 mmol/L (ref 98–111)
Creatinine, Ser: 3.21 mg/dL — ABNORMAL HIGH (ref 0.61–1.24)
GFR calc Af Amer: 21 mL/min — ABNORMAL LOW (ref >60)
GFR calc non Af Amer: 18 mL/min — ABNORMAL LOW (ref >60)
Glucose, Bld: 111 mg/dL — ABNORMAL HIGH (ref 70–99)
Potassium: 4.3 mmol/L (ref 3.5–5.1)
Sodium: 138 mmol/L (ref 135–145)

## 2020-01-18 LAB — MAGNESIUM: Magnesium: 1.9 mg/dL (ref 1.7–2.4)

## 2020-01-18 NOTE — Progress Notes (Signed)
[address] [place], [place] [address] Phone [phone]. Fax [phone]  Date: 01/18/2020                  Patient Name:  Donald Foster  MRN: [id number]  DOB: October 01, 1945  Age / Sex: 74 y.o., male         PCP: Elisabeth Cara, NP                 Service Requesting Consult: IM/ Enzo Bi, MD                 Reason for Consult: ARF            History of Present Illness: Patient is a 74 y.o. male with medical problems of COPD, BPH, hypertension, who was admitted to [facility] on 01/14/2020 for evaluation of dribbling, difficulty urination and incontinence for about 1 week.  In the ER he was diagnosed with severe urinary retention.  Per notes, after Foley was placed, 1 L of urine was obtained.  He was also incidentally noted to have elevated creatinine of 6.07 at admission.  Nephrology consult for evaluation Initial urinalysis negative for blood or protein Repeat urinalysis at 1230 shows greater than 50 RBCs, 11-20 WBCs  Today patient is doing fair Urine output is good at 3600 cc Serum creatinine trend is improving Renal ultrasound shows bilateral moderate hydronephrosis States his appetite is good   Current medications: Current Facility-Administered Medications  Medication Dose Route Frequency Provider Last Rate Last Admin   albuterol (PROVENTIL) (2.5 MG/3ML) 0.083% nebulizer solution 3 mL  3 mL Inhalation Q4H PRN Sharen Hones, MD       amLODipine (NORVASC) tablet 10 mg  10 mg Oral Daily Sharen Hones, MD   10 mg at 01/18/20 1002   atorvastatin (LIPITOR) tablet 20 mg  20 mg Oral QHS Sharen Hones, MD   20 mg at 01/17/20 2054   Chlorhexidine Gluconate Cloth 2 % PADS 6 each  6 each Topical Daily Sharen Hones, MD   6 each at 01/18/20 1008   fluticasone furoate-vilanterol (BREO ELLIPTA) 100-25 MCG/INH 1 puff  1 puff Inhalation Daily Sharen Hones, MD   1 puff at 01/18/20 1006   lactated ringers infusion   Intravenous Continuous Enzo Bi, MD 75 mL/hr at 01/18/20 1008  Rate Change at 01/18/20 1008   oxyCODONE (Oxy IR/ROXICODONE) immediate release tablet 5 mg  5 mg Oral Q6H PRN Lang Snow, NP   5 mg at 01/18/20 1002   pantoprazole (PROTONIX) EC tablet 40 mg  40 mg Oral Daily Sharen Hones, MD   40 mg at 01/18/20 1002   tamsulosin (FLOMAX) capsule 0.4 mg  0.4 mg Oral Daily Sharen Hones, MD   0.4 mg at 01/18/20 1002   tiotropium (SPIRIVA) inhalation capsule ([facility] use ONLY) 18 mcg  18 mcg Inhalation Daily Sharen Hones, MD   18 mcg at 01/18/20 1006   traMADol (ULTRAM) tablet 50 mg  50 mg Oral Q12H PRN Lang Snow, NP   50 mg at 01/17/20 2054      Vital Signs: Blood pressure 125/79, pulse 92, temperature 98.3 F (36.8 C), temperature source Oral, resp. rate 20, height 5\' 3"  (1.6 m), weight 65.3 kg, SpO2 93 %.   Intake/Output Summary (Last 24 hours) at 01/18/2020 1246 Last data filed at 01/18/2020 1000 Gross per 24 hour  Intake 1630.78 ml  Output 3400 ml  Net -1769.22 ml    Weight trends: [employer]   01/14/20 [phone]  Weight: 65.3 kg    Physical Exam: General:  No acute distress, laying in the bed  HEENT  moist oral mucous membranes, anicteric  Neck:  Supple, no JVD, no masses  Lungs:  Normal breathing effort, clear to auscultation  Heart::  Regular, no rub or gallop  Abdomen:  Soft, nontender  Extremities:  No peripheral edema  Neurologic:  Alert, oriented  Skin:  Warm, dry  Foley:  In place with blood-tinged urine       Lab results: Basic Metabolic Panel: Recent Labs  Lab 01/16/20 0409 01/17/20 0533 01/18/20 0626  NA 139 138 138  K 4.6 4.1 4.3  CL 110 106 104  CO2 20* 24 27  GLUCOSE 100* 112* 111*  BUN 44* 36* 28*  CREATININE 4.27* 3.51* 3.21*  CALCIUM 8.3* 8.6* 8.9  MG 1.5* 1.6* 1.9    Liver Function Tests: Recent Labs  Lab 01/15/20 0402  AST 17  ALT 12  ALKPHOS 61  BILITOT 0.6  PROT 6.9  ALBUMIN 3.3*   No results for input(s): LIPASE, AMYLASE in the last 168 hours. No results for  input(s): AMMONIA in the last 168 hours.  CBC: Recent Labs  Lab 01/16/20 0409 01/16/20 0409 01/17/20 0533 01/18/20 0626  WBC 5.3   < > 5.6 5.6  NEUTROABS 3.2  --   --   --   HGB 11.6*   < > 11.1* 11.6*  HCT 33.6*   < > 33.2* 33.8*  MCV 69.1*   < > 71.4* 70.9*  PLT 217   < > 209 206   < > = values in this interval not displayed.    Cardiac Enzymes: No results for input(s): CKTOTAL, TROPONINI in the last 168 hours.  BNP: Invalid input(s): POCBNP  CBG: No results for input(s): GLUCAP in the last 168 hours.  Microbiology: Recent Results (from the past 720 hour(s))  Urine culture     Status: Abnormal   Collection Time: 01/14/20  9:50 AM   Specimen: Urine, Clean Catch  Result Value Ref Range Status   Specimen Description   Final    URINE, CLEAN CATCH Performed at [facility], [address] [place], [place] [address]    Special Requests   Final    NONE Performed at [facility], [address] [place], [place] [address]    Culture (A)  Final    <10,000 COLONIES/mL INSIGNIFICANT GROWTH Performed at [place] [facility] [address], [place], [place] [address]    Report Status 01/16/2020 FINAL  Final  SARS Coronavirus 2 by RT PCR (hospital order, performed in [facility] hospital lab) Nasopharyngeal Nasopharyngeal Swab     Status: None   Collection Time: 01/14/20 12:31 PM   Specimen: Nasopharyngeal Swab  Result Value Ref Range Status   SARS Coronavirus 2 NEGATIVE NEGATIVE Final    Comment: (NOTE) SARS-CoV-2 target nucleic acids are NOT DETECTED.  The SARS-CoV-2 RNA is generally detectable in upper and lower respiratory specimens during the acute phase of infection. The lowest concentration of SARS-CoV-2 viral copies this assay can detect is 250 copies / mL. A negative result does not preclude SARS-CoV-2 infection and should not be used as the sole basis for treatment or other patient management decisions.  A negative result may occur  with improper specimen collection / handling, submission of specimen other than nasopharyngeal swab, presence of viral mutation(s) within the areas targeted by this assay, and inadequate number of viral copies (<250 copies / mL). A  negative result must be combined with clinical observations, patient history, and epidemiological information.  Fact Sheet for Patients:   StrictlyIdeas.no  Fact Sheet for Healthcare Providers: BankingDealers.co.za  This test is not yet approved or  cleared by the [place] FDA and has been authorized for detection and/or diagnosis of SARS-CoV-2 by FDA under an Emergency Use Authorization (EUA).  This EUA will remain in effect (meaning this test can be used) for the duration of the COVID-19 declaration under Section 564(b)(1) of the Act, 21 U.S.C. section 360bbb-3(b)(1), unless the authorization is terminated or revoked sooner.  Performed at [facility], [address] [place], [place] [address]      Coagulation Studies: No results for input(s): LABPROT, INR in the last 72 hours.  Urinalysis: No results for input(s): COLORURINE, LABSPEC, PHURINE, GLUCOSEU, HGBUR, BILIRUBINUR, KETONESUR, PROTEINUR, UROBILINOGEN, NITRITE, LEUKOCYTESUR in the last 72 hours.  Invalid input(s): APPERANCEUR      Imaging: No results found.   Assessment & Plan: Pt is a 74 y.o. African-American  male with COPD, BPH, hypertension, was admitted on 01/14/2020 with Urinary retention [R33.9] AKI (acute kidney injury) ([place]) [N17.9] Urinary tract infection with hematuria, site unspecified [N39.0, R31.9]  #.Acute kidney injury #Chronic kidney disease stage IIIa GFR 52-60 in January 2021 #Acute urinary retention with moderate bilateral hydronephrosis #History of BPH #Gross hematuria  Acute kidney injury is likely secondary to acute urinary retention.  Hematuria may be due to Foley placement trauma or rapid  bladder decompression. Patient has had good urine output and serum creatinine is improving satisfactorily Appreciate urology evaluation Renal ultrasound shows moderate bilateral hydronephrosis Follow Creatinine trends. Expected to improve close to baseline. Electrolytes and volume status are acceptable.  No acute indication for dialysis at present. Follow urology recommendations We will schedule outpatient follow-up     LOS: 4 Theotis Gerdeman 7/9/202112:46 PM    Note: This note was prepared with Dragon dictation. Any transcription errors are unintentional

## 2020-01-18 NOTE — Progress Notes (Signed)
Pt ambulated in the room without any assistive devices. Pt became dizzy while walking and was put back in bed, dizziness subsided.

## 2020-01-18 NOTE — Progress Notes (Signed)
PROGRESS NOTE    Donald Foster  INO:[id number] DOB: March 20, 1946 DOA: 01/14/2020 PCP: Elisabeth Cara, NP   Chief complaint.  Urinary retension. Brief Narrative:   Patient is a 74 year old male with history of COPD, benign prostate hypertrophy, dyslipidemia and essential hypertension who present to the hospital with dysuria and urinary retention.  Upon arrival to the emergency room, he was found to have urinary retention, Foley catheter was anchored, drained 1 L of urine, initially with clear then become bloody.  He was placed on Rocephin and a urine culture was sent out.  His creatinine was 6.07, WBC 3.8, hemoglobin 10.9.  7/6.  Patient has more than 5 L urine output today.  Renal function stable.  Supplement magnesium.   Assessment & Plan:   Active Problems:   BPH (benign prostatic hyperplasia)   AKI (acute kidney injury) ([place])   Acute cystitis with hematuria   COPD with chronic bronchitis ([place])  #1. Acute renal failure secondary to urinary obstruction  Bilateral hydronephrosis --Appreciate nephrology consult --Continue Foley catheter.   --taper down MIVF today in preparation for discharge.  2.  Urinary retention secondary to benign prostate hypertrophy. --urology consulted --Continue Foley catheter.   --taper down MIVF today in preparation for discharge.  3.  Hematuria 2/2 trauma caused by urinary retention UTI ruled out --Urine cx no significant growth D/c Rocephin  4.  COPD. Stable.  5.  Essential hypertension. Continue amlodipine.   DVT prophylaxis: SCD given hematuria Code Status: Full Family Communication:  Disposition Plan:  . Patient came from:Home . Anticipated d/c place: Home tomorrow . Barriers to d/c OR conditions which need to be met to effect a safe d/c:  Will taper off MIVF today and discharge tomorrow.   Consultants:   Nephrology and urology.  Subjective: No pain.  No complaints.  Good urine output with urine now more pink than red.  Pt  walked with no assistance.   Objective: Vitals:   01/17/20 2135 01/18/20 0610 01/18/20 0900 01/18/20 1223  BP: 140/78 121/84 132/87 125/79  Pulse: 97 91 92 92  Resp: 20 18  20   Temp: 98.5 F (36.9 C) 98.1 F (36.7 C) 98.5 F (36.9 C) 98.3 F (36.8 C)  TempSrc: Oral Oral Oral Oral  SpO2: 95% 91%  93%  Weight:      Height:        Intake/Output Summary (Last 24 hours) at 01/18/2020 1518 Last data filed at 01/18/2020 1000 Gross per 24 hour  Intake 1630.78 ml  Output 2700 ml  Net -1069.22 ml   Filed Weights   01/14/20 0943  Weight: 65.3 kg    Examination: Constitutional: NAD, AAOx3 HEENT: conjunctivae and lids normal, EOMI CV: RRR no M,R,G. Distal pulses +2.  No cyanosis.   RESP: clear breath sounds, normal respiratory effort  GI: +BS, NTND Extremities: No effusions, edema, or tenderness in BLE SKIN: warm, dry and intact Neuro: II - XII grossly intact.  Sensation intact Psych: Normal mood and affect.  Appropriate judgement and reason  Foley outputting pink urine.   Data Reviewed: I have personally reviewed following labs and imaging studies  CBC: Recent Labs  Lab 01/14/20 2033 01/15/20 0402 01/16/20 0409 01/17/20 0533 01/18/20 0626  WBC 7.0 6.8 5.3 5.6 5.6  NEUTROABS  --   --  3.2  --   --   HGB 12.7* 11.6* 11.6* 11.1* 11.6*  HCT 36.9* 34.5* 33.6* 33.2* 33.8*  MCV 70.4* 70.4* 69.1* 71.4* 70.9*  PLT 227 220 217 209 206  Basic Metabolic Panel: Recent Labs  Lab 01/14/20 0950 01/14/20 0950 01/15/20 0402 01/15/20 1343 01/16/20 0409 01/17/20 0533 01/18/20 0626  NA 140  --  139  --  139 138 138  K 4.5  --  4.9  --  4.6 4.1 4.3  CL 114*  --  112*  --  110 106 104  CO2 18*  --  18*  --  20* 24 27  GLUCOSE 78  --  99  --  100* 112* 111*  BUN 58*  --  53*  --  44* 36* 28*  CREATININE 6.07*  --  5.53*  --  4.27* 3.51* 3.21*  CALCIUM 7.5*  --  7.7*  --  8.3* 8.6* 8.9  MG 1.0*   < > 0.7* 2.3 1.5* 1.6* 1.9   < > = values in this interval not displayed.    GFR: Estimated Creatinine Clearance: 16.7 mL/min (A) (by C-G formula based on SCr of 3.21 mg/dL (H)). Liver Function Tests: Recent Labs  Lab 01/14/20 0950 01/15/20 0402  AST 20 17  ALT 15 12  ALKPHOS 63 61  BILITOT 0.6 0.6  PROT 7.7 6.9  ALBUMIN 3.8 3.3*   No results for input(s): LIPASE, AMYLASE in the last 168 hours. No results for input(s): AMMONIA in the last 168 hours. Coagulation Profile: No results for input(s): INR, PROTIME in the last 168 hours. Cardiac Enzymes: No results for input(s): CKTOTAL, CKMB, CKMBINDEX, TROPONINI in the last 168 hours. BNP (last 3 results) No results for input(s): PROBNP in the last 8760 hours. HbA1C: No results for input(s): HGBA1C in the last 72 hours. CBG: No results for input(s): GLUCAP in the last 168 hours. Lipid Profile: No results for input(s): CHOL, HDL, LDLCALC, TRIG, CHOLHDL, LDLDIRECT in the last 72 hours. Thyroid Function Tests: No results for input(s): TSH, T4TOTAL, FREET4, T3FREE, THYROIDAB in the last 72 hours. Anemia Panel: Recent Labs    01/17/20 0533  TIBC 227*  IRON 62   Sepsis Labs: No results for input(s): PROCALCITON, LATICACIDVEN in the last 168 hours.  Recent Results (from the past 240 hour(s))  Urine culture     Status: Abnormal   Collection Time: 01/14/20  9:50 AM   Specimen: Urine, Clean Catch  Result Value Ref Range Status   Specimen Description   Final    URINE, CLEAN CATCH Performed at [facility], [address] [place], [place] [address]    Special Requests   Final    NONE Performed at [facility], [address] [place], [place] [address]    Culture (A)  Final    <10,000 COLONIES/mL INSIGNIFICANT GROWTH Performed at [facility] [address], [place], [place] [address]    Report Status 01/16/2020 FINAL  Final  SARS Coronavirus 2 by RT PCR (hospital order, performed in [facility] hospital lab) Nasopharyngeal Nasopharyngeal Swab     Status: None    Collection Time: 01/14/20 12:31 PM   Specimen: Nasopharyngeal Swab  Result Value Ref Range Status   SARS Coronavirus 2 NEGATIVE NEGATIVE Final    Comment: (NOTE) SARS-CoV-2 target nucleic acids are NOT DETECTED.  The SARS-CoV-2 RNA is generally detectable in upper and lower respiratory specimens during the acute phase of infection. The lowest concentration of SARS-CoV-2 viral copies this assay can detect is 250 copies / mL. A negative result does not preclude SARS-CoV-2 infection and should not be used as the sole basis for treatment or other patient management decisions.  A  negative result may occur with improper specimen collection / handling, submission of specimen other than nasopharyngeal swab, presence of viral mutation(s) within the areas targeted by this assay, and inadequate number of viral copies (<250 copies / mL). A negative result must be combined with clinical observations, patient history, and epidemiological information.  Fact Sheet for Patients:   StrictlyIdeas.no  Fact Sheet for Healthcare Providers: BankingDealers.co.za  This test is not yet approved or  cleared by the [place] FDA and has been authorized for detection and/or diagnosis of SARS-CoV-2 by FDA under an Emergency Use Authorization (EUA).  This EUA will remain in effect (meaning this test can be used) for the duration of the COVID-19 declaration under Section 564(b)(1) of the Act, 21 U.S.C. section 360bbb-3(b)(1), unless the authorization is terminated or revoked sooner.  Performed at [facility], [address], [place], [place] [address]          Radiology Studies: No results found.      Scheduled Meds: . amLODipine  10 mg Oral Daily  . atorvastatin  20 mg Oral QHS  . Chlorhexidine Gluconate Cloth  6 each Topical Daily  . fluticasone furoate-vilanterol  1 puff Inhalation Daily  . pantoprazole  40 mg Oral Daily  .  tamsulosin  0.4 mg Oral Daily  . tiotropium  18 mcg Inhalation Daily   Continuous Infusions: . lactated ringers 75 mL/hr at 01/18/20 1008     LOS: 4 days      Enzo Bi, MD [facility]   To contact the attending provider between 7A-7P or the covering provider during after hours 7P-7A, please log into the web site [email or web address] and access using universal [place] password for that web site. If you do not have the password, please call the hospital operator.  01/18/2020, 3:18 PM

## 2020-01-19 LAB — BASIC METABOLIC PANEL
Anion gap: 10 (ref 5–15)
BUN: 27 mg/dL — ABNORMAL HIGH (ref 8–23)
CO2: 27 mmol/L (ref 22–32)
Calcium: 8.8 mg/dL — ABNORMAL LOW (ref 8.9–10.3)
Chloride: 100 mmol/L (ref 98–111)
Creatinine, Ser: 3.16 mg/dL — ABNORMAL HIGH (ref 0.61–1.24)
GFR calc Af Amer: 22 mL/min — ABNORMAL LOW (ref >60)
GFR calc non Af Amer: 19 mL/min — ABNORMAL LOW (ref >60)
Glucose, Bld: 114 mg/dL — ABNORMAL HIGH (ref 70–99)
Potassium: 4.2 mmol/L (ref 3.5–5.1)
Sodium: 137 mmol/L (ref 135–145)

## 2020-01-19 LAB — CBC
HCT: 34.4 % — ABNORMAL LOW (ref 39.0–52.0)
Hemoglobin: 11.3 g/dL — ABNORMAL LOW (ref 13.0–17.0)
MCH: 23.8 pg — ABNORMAL LOW (ref 26.0–34.0)
MCHC: 32.8 g/dL (ref 30.0–36.0)
MCV: 72.4 fL — ABNORMAL LOW (ref 80.0–100.0)
Platelets: 218 10*3/uL (ref 150–400)
RBC: 4.75 MIL/uL (ref 4.22–5.81)
RDW: 17.2 % — ABNORMAL HIGH (ref 11.5–15.5)
WBC: 6.4 10*3/uL (ref 4.0–10.5)
nRBC: 0 % (ref 0.0–0.2)

## 2020-01-19 LAB — MAGNESIUM: Magnesium: 1.5 mg/dL — ABNORMAL LOW (ref 1.7–2.4)

## 2020-01-19 MED ORDER — LOSARTAN POTASSIUM 50 MG PO TABS: ORAL_TABLET | ORAL | Status: DC

## 2020-01-19 NOTE — Progress Notes (Signed)
Discharge instructions and medication details reviewed with patient. Patient verbalizes understanding. Printed AVS given to patient. Patient education with successful teach back on foley catheter care.  Leg bag and standard bag provided IV removed. Patient belongings returned to patient.  Pt escorted out via wheelchair.

## 2020-01-19 NOTE — Progress Notes (Signed)
[facility]  ROUNDING NOTE   Subjective:  Good urine output of 2.7 L noted. Creatinine down slightly to 3.16. Patient in good spirits today.   Objective:  Vital signs in last 24 hours:  Temp:  [97.5 F (36.4 C)-98.6 F (37 C)] 98.3 F (36.8 C) (07/10 1220) Pulse Rate:  [90-99] 92 (07/10 1220) Resp:  [16-18] 18 (07/10 1220) BP: (128-176)/(75-84) 176/84 (07/10 1220) SpO2:  [93 %-96 %] 96 % (07/10 1220)  Weight change:  Filed Weights   01/14/20 0943  Weight: 65.3 kg    Intake/Output: I/O last 3 completed shifts: In: 3737.7 [P.O.:600; I.V.:3137.7] Out: 4150 [Urine:4150]   Intake/Output this shift:  No intake/output data recorded.  Physical Exam: General: No acute distress  Head: Normocephalic, atraumatic. Moist oral mucosal membranes  Eyes: Anicteric  Neck: Supple, trachea midline  Lungs:  Clear to auscultation, normal effort  Heart: S1S2 no rubs  Abdomen:  Soft, nontender, bowel sounds present  Extremities: No peripheral edema.  Neurologic: Awake, alert, following commands  Skin: No lesions  GU: Foley catheter in place    Basic Metabolic Panel: Recent Labs  Lab 01/15/20 0402 01/15/20 0402 01/15/20 1343 01/16/20 0409 01/16/20 0409 01/17/20 0533 01/18/20 0626 01/19/20 0840  NA 139  --   --  139  --  138 138 137  K 4.9  --   --  4.6  --  4.1 4.3 4.2  CL 112*  --   --  110  --  106 104 100  CO2 18*  --   --  20*  --  _0 GLUCOSE 99  --   --  100*  --  112* 111* 114*  BUN 53*  --   --  44*  --  36* 28* 27*  CREATININE 5.53*  --   --  4.27*  --  3.51* 3.21* 3.16*  CALCIUM 7.7*   < >  --  8.3*   < > 8.6* 8.9 8.8*  MG 0.7*   < > 2.3 1.5*  --  1.6* 1.9 1.5*   < > = values in this interval not displayed.    Liver Function Tests: Recent Labs  Lab 01/14/20 0950 01/15/20 0402  AST 20 17  ALT 15 12  ALKPHOS 63 61  BILITOT 0.6 0.6  PROT 7.7 6.9  ALBUMIN 3.8 3.3*   No results for input(s): LIPASE, AMYLASE in the last 168 hours. No  results for input(s): AMMONIA in the last 168 hours.  CBC: Recent Labs  Lab 01/15/20 0402 01/16/20 0409 01/17/20 0533 01/18/20 0626 01/19/20 0840  WBC 6.8 5.3 5.6 5.6 6.4  NEUTROABS  --  3.2  --   --   --   HGB 11.6* 11.6* 11.1* 11.6* 11.3*  HCT 34.5* 33.6* 33.2* 33.8* 34.4*  MCV 70.4* 69.1* 71.4* 70.9* 72.4*  PLT 220 217 209 206 218    Cardiac Enzymes: No results for input(s): CKTOTAL, CKMB, CKMBINDEX, TROPONINI in the last 168 hours.  BNP: Invalid input(s): POCBNP  CBG: No results for input(s): GLUCAP in the last 168 hours.  Microbiology: Results for orders placed or performed during the hospital encounter of 01/14/20  Urine culture     Status: Abnormal   Collection Time: 01/14/20  9:50 AM   Specimen: Urine, Clean Catch  Result Value Ref Range Status   Specimen Description   Final    URINE, CLEAN CATCH Performed at [facility], [address] [place], [place] [address]  Special Requests   Final    NONE Performed at [facility], [address] [place], [place] [address]    Culture (A)  Final    <10,000 COLONIES/mL INSIGNIFICANT GROWTH Performed at [facility] [address] [place], [place] [address]    Report Status 01/16/2020 FINAL  Final  SARS Coronavirus 2 by RT PCR (hospital order, performed in [facility] hospital lab) Nasopharyngeal Nasopharyngeal Swab     Status: None   Collection Time: 01/14/20 12:31 PM   Specimen: Nasopharyngeal Swab  Result Value Ref Range Status   SARS Coronavirus 2 NEGATIVE NEGATIVE Final    Comment: (NOTE) SARS-CoV-2 target nucleic acids are NOT DETECTED.  The SARS-CoV-2 RNA is generally detectable in upper and lower respiratory specimens during the acute phase of infection. The lowest concentration of SARS-CoV-2 viral copies this assay can detect is 250 copies / mL. A negative result does not preclude SARS-CoV-2 infection and should not be used as the sole basis for treatment or  other patient management decisions.  A negative result may occur with improper specimen collection / handling, submission of specimen other than nasopharyngeal swab, presence of viral mutation(s) within the areas targeted by this assay, and inadequate number of viral copies (<250 copies / mL). A negative result must be combined with clinical observations, patient history, and epidemiological information.  Fact Sheet for Patients:   StrictlyIdeas.no  Fact Sheet for Healthcare Providers: BankingDealers.co.za  This test is not yet approved or  cleared by the [place] FDA and has been authorized for detection and/or diagnosis of SARS-CoV-2 by FDA under an Emergency Use Authorization (EUA).  This EUA will remain in effect (meaning this test can be used) for the duration of the COVID-19 declaration under Section 564(b)(1) of the Act, 21 U.S.C. section 360bbb-3(b)(1), unless the authorization is terminated or revoked sooner.  Performed at [facility], [address] [place],  [address]     Coagulation Studies: No results for input(s): LABPROT, INR in the last 72 hours.  Urinalysis: No results for input(s): COLORURINE, LABSPEC, PHURINE, GLUCOSEU, HGBUR, BILIRUBINUR, KETONESUR, PROTEINUR, UROBILINOGEN, NITRITE, LEUKOCYTESUR in the last 72 hours.  Invalid input(s): APPERANCEUR    Imaging: No results found.   Medications:    . amLODipine  10 mg Oral Daily  . atorvastatin  20 mg Oral QHS  . Chlorhexidine Gluconate Cloth  6 each Topical Daily  . fluticasone furoate-vilanterol  1 puff Inhalation Daily  . pantoprazole  40 mg Oral Daily  . tamsulosin  0.4 mg Oral Daily  . tiotropium  18 mcg Inhalation Daily   albuterol, oxyCODONE, traMADol  Assessment/ Plan:  74 y.o. male with COPD, BPH, hypertension, was admitted on 01/14/2020  with acute urinary retention, acute kidney injury, hematuria, chronic kidney disease  stage III yea baseline EGFR 52-60, BPH  1.  Acute kidney injury. 2.  Chronic kidney disease stage IIIa baseline EGFR 52-60 3.  Acute urinary retention with moderate bilateral hydronephrosis. 4.  History of BPH. 5.  Gross hematuria.  Plan: Renal function has improved as compared to admission.  Creatinine currently 3.16.  Good urine output noted.  Urine is slightly blood-tinged.  Continue to monitor urine output and hematuria.  No indication for dialysis.  Also continue to monitor renal function daily.   LOS: 5 Kissie Ziolkowski 7/10/20212:02 PM

## 2020-01-19 NOTE — Discharge Summary (Signed)
Physician Discharge Summary   Donald Foster  male DOB: [birth year]  DGL:[id number]  PCP: Elisabeth Cara, NP  Admit date: 01/14/2020 Discharge date: 01/19/2020  Admitted From: home Disposition:  home CODE STATUS: Full code  Discharge Instructions    Discharge instructions   Complete by: As directed    Because of your severe urinary retention, you will go home with the Foley catheter and follow up with urology for voiding trial.  Please follow up with nephrology for your kidney injury.   Dr. Enzo Bi [facility] Course:  For full details, please see H&P, progress notes, consult notes and ancillary notes.  Briefly,  Donald Foster is a 74 year old male with history of COPD, benign prostate hypertrophy, dyslipidemia and essential hypertension who presented to the hospital with dysuria and urinary retention. Upon arrival to the emergency room, he was found to have urinary retention, Foley catheter was inserted and drained 1 L of urine, initially clear then become bloody. He was placed on Rocephin and a urine culture was sent out. His creatinine was 6.07, WBC 3.8, hemoglobin 10.9.  1. Acute renal failure secondary to urinary obstruction  Bilateral hydronephrosis Cr 6.07 on presentation.  Nephrology consulted.  MIVF continued with Cr gradually trending down.  On the day of discharge, Cr 3.16.  Pt was cleared for discharge by nephrology and will follow up with them as outpatient.  2.  Urinary retention secondary to benign prostate hypertrophy. Urology consulted.  MIVF continued to match post-obstructive diuresis.  Foley cath continued and pt was discharged with Foley cath to follow up with urology as outpatient.  3.  Hematuria 2/2 trauma caused by urinary retention UTI ruled out Urine cx no significant growth, so Rocephin d/c'ed.  Pt's urine was running more clear prior to discharge.  Hgb stable with no drop.  Pt will follow up with urology as outpatient for  cystoscopy.   4.  COPD. Stable.  5.  Essential hypertension. Continued amlodipine.   Discharge Diagnoses:  Active Problems:   BPH (benign prostatic hyperplasia)   AKI (acute kidney injury) ([place])   Acute cystitis with hematuria   COPD with chronic bronchitis [facility])    Discharge Instructions:  Allergies as of 01/19/2020   No Known Allergies     Medication List    STOP taking these medications   multivitamin with minerals Tabs tablet   traMADol 50 MG tablet Commonly known as: Ultram     TAKE these medications   albuterol 108 (90 Base) MCG/ACT inhaler Commonly known as: VENTOLIN HFA Inhale 2 puffs into the lungs every 4 (four) hours as needed for wheezing or shortness of breath. Every 4-6 hrs prn   aluminum hydroxide-magnesium carbonate 95-358 MG/15ML Susp Commonly known as: Gaviscon Take 15 mLs by mouth 4 (four) times daily - after meals and at bedtime.   amLODipine 10 MG tablet Commonly known as: NORVASC Take 10 mg by mouth daily.   atorvastatin 20 MG tablet Commonly known as: LIPITOR Take 20 mg by mouth at bedtime.   Fluticasone-Salmeterol 100-50 MCG/DOSE Aepb Commonly known as: ADVAIR Inhale 1 puff into the lungs 2 (two) times daily.   losartan 50 MG tablet Commonly known as: COZAAR Hold until outpatient doctor followup, due to acute kidney injury. What changed:   how much to take  how to take this  when to take this  additional instructions   omeprazole 40 MG capsule Commonly known as: PRILOSEC Take 1 capsule (40  mg total) by mouth 2 (two) times daily before a meal.   Spiriva HandiHaler 18 MCG inhalation capsule Generic drug: tiotropium Place 18 mcg into inhaler and inhale daily.   tamsulosin 0.4 MG Caps capsule Commonly known as: FLOMAX Take 0.4 mg by mouth daily.        Follow-up Information    Murlean Iba, MD. Schedule an appointment as soon as possible for a visit in 2 week(s).   Specialty: Nephrology Why: follow up for  kidney injury. Contact information: [place] [address] [phone]        Elisabeth Cara, NP. Schedule an appointment as soon as possible for a visit in 1 week(s).   Specialty: Nurse Practitioner Contact information: [place] [address] [phone]               No Known Allergies   The results of significant diagnostics from this hospitalization (including imaging, microbiology, ancillary and laboratory) are listed below for reference.   Consultations:   Procedures/Studies: DG Chest 1 View  Result Date: 01/14/2020 CLINICAL DATA:  74 year old male with tachycardia. EXAM: CHEST  1 VIEW COMPARISON:  Chest radiograph dated 08/06/2019. FINDINGS: Left lung base linear atelectasis/scarring. No focal consolidation, pleural effusion, or pneumothorax. The cardiac silhouette is within limits. No acute osseous pathology. Cholecystectomy clips. IMPRESSION: No active disease. Electronically Signed   By: Anner Crete M.D.   On: 01/14/2020 20:34   US RENAL  Result Date: 01/15/2020 CLINICAL DATA:  Acute renal failure EXAM: RENAL / URINARY TRACT ULTRASOUND COMPLETE COMPARISON:  None. FINDINGS: Right Kidney: Renal measurements: 9.8 x 5.5 x 6.1 cm = volume: 171.3 mL. Moderate right hydronephrosis. Cortical echogenicity within normal limits. Left Kidney: Renal measurements: 8.9 x 4.9 x 4.4 cm = volume: 112.7 mL. Moderate left hydronephrosis. No mass. Cortical echogenicity within normal limits. Bladder: Decompressed by Foley catheter.  Bladder appears thick walled. Other: None. IMPRESSION: 1. Moderate bilateral hydronephrosis. 2. Decompressed urinary bladder by Foley catheter, bladder wall appears thickened. Electronically Signed   By: Donavan Foil M.D.   On: 01/15/2020 16:57      Labs: BNP (last 3 results) No results for input(s): BNP in the last 8760 hours. Basic Metabolic Panel: Recent Labs  Lab 01/14/20 0950 01/14/20 0950  01/15/20 0402 01/15/20 1343 01/16/20 0409 01/17/20 0533 01/18/20 0626  NA 140  --  139  --  139 138 138  K 4.5  --  4.9  --  4.6 4.1 4.3  CL 114*  --  112*  --  110 106 104  CO2 18*  --  18*  --  20* 24 27  GLUCOSE 78  --  99  --  100* 112* 111*  BUN 58*  --  53*  --  44* 36* 28*  CREATININE 6.07*  --  5.53*  --  4.27* 3.51* 3.21*  CALCIUM 7.5*  --  7.7*  --  8.3* 8.6* 8.9  MG 1.0*   < > 0.7* 2.3 1.5* 1.6* 1.9   < > = values in this interval not displayed.   Liver Function Tests: Recent Labs  Lab 01/14/20 0950 01/15/20 0402  AST 20 17  ALT 15 12  ALKPHOS 63 61  BILITOT 0.6 0.6  PROT 7.7 6.9  ALBUMIN 3.8 3.3*   No results for input(s): LIPASE, AMYLASE in the last 168 hours. No results for input(s): AMMONIA in the last 168 hours. CBC: Recent Labs  Lab 01/15/20 0402 01/16/20 0409 01/17/20  0533 01/18/20 0626 01/19/20 0840  WBC 6.8 5.3 5.6 5.6 6.4  NEUTROABS  --  3.2  --   --   --   HGB 11.6* 11.6* 11.1* 11.6* 11.3*  HCT 34.5* 33.6* 33.2* 33.8* 34.4*  MCV 70.4* 69.1* 71.4* 70.9* 72.4*  PLT 220 217 209 206 218   Cardiac Enzymes: No results for input(s): CKTOTAL, CKMB, CKMBINDEX, TROPONINI in the last 168 hours. BNP: Invalid input(s): POCBNP CBG: No results for input(s): GLUCAP in the last 168 hours. D-Dimer No results for input(s): DDIMER in the last 72 hours. Hgb A1c No results for input(s): HGBA1C in the last 72 hours. Lipid Profile No results for input(s): CHOL, HDL, LDLCALC, TRIG, CHOLHDL, LDLDIRECT in the last 72 hours. Thyroid function studies No results for input(s): TSH, T4TOTAL, T3FREE, THYROIDAB in the last 72 hours.  Invalid input(s): FREET3 Anemia work up Recent Labs    01/17/20 0533  TIBC 227*  IRON 62   Urinalysis    Component Value Date/Time   COLORURINE RED (A) 01/14/2020 1231   APPEARANCEUR CLOUDY (A) 01/14/2020 1231   LABSPEC 1.012 01/14/2020 1231   PHURINE  01/14/2020 1231    TEST NOT REPORTED DUE TO COLOR INTERFERENCE OF URINE  PIGMENT   GLUCOSEU (A) 01/14/2020 1231    TEST NOT REPORTED DUE TO COLOR INTERFERENCE OF URINE PIGMENT   HGBUR (A) 01/14/2020 1231    TEST NOT REPORTED DUE TO COLOR INTERFERENCE OF URINE PIGMENT   BILIRUBINUR (A) 01/14/2020 1231    TEST NOT REPORTED DUE TO COLOR INTERFERENCE OF URINE PIGMENT   KETONESUR (A) 01/14/2020 1231    TEST NOT REPORTED DUE TO COLOR INTERFERENCE OF URINE PIGMENT   PROTEINUR (A) 01/14/2020 1231    TEST NOT REPORTED DUE TO COLOR INTERFERENCE OF URINE PIGMENT   NITRITE (A) 01/14/2020 1231    TEST NOT REPORTED DUE TO COLOR INTERFERENCE OF URINE PIGMENT   LEUKOCYTESUR (A) 01/14/2020 1231    TEST NOT REPORTED DUE TO COLOR INTERFERENCE OF URINE PIGMENT   Sepsis Labs Invalid input(s): PROCALCITONIN,  WBC,  LACTICIDVEN Microbiology Recent Results (from the past 240 hour(s))  Urine culture     Status: Abnormal   Collection Time: 01/14/20  9:50 AM   Specimen: Urine, Clean Catch  Result Value Ref Range Status   Specimen Description   Final    URINE, CLEAN CATCH Performed at [facility], [address] [place], [place] [address]    Special Requests   Final    NONE Performed at [facility], [address], [place], [place] [address]    Culture (A)  Final    <10,000 COLONIES/mL INSIGNIFICANT GROWTH Performed at [facility], [place] [address] [place], [place] [address]    Report Status 01/16/2020 FINAL  Final  SARS Coronavirus 2 by RT PCR (hospital order, performed in [facility] hospital lab) Nasopharyngeal Nasopharyngeal Swab     Status: None   Collection Time: 01/14/20 12:31 PM   Specimen: Nasopharyngeal Swab  Result Value Ref Range Status   SARS Coronavirus 2 NEGATIVE NEGATIVE Final    Comment: (NOTE) SARS-CoV-2 target nucleic acids are NOT DETECTED.  The SARS-CoV-2 RNA is generally detectable in upper and lower respiratory specimens during the acute phase of infection. The lowest concentration of SARS-CoV-2 viral copies this  assay can detect is 250 copies / mL. A negative result does not preclude SARS-CoV-2 infection and should not be used as the sole basis for treatment or other patient management decisions.  A negative result may occur with improper specimen collection / handling, submission of specimen other than nasopharyngeal swab, presence of viral mutation(s) within the areas targeted by this assay, and inadequate number of viral copies (<250 copies / mL). A negative result must be combined with clinical observations, patient history, and epidemiological information.  Fact Sheet for Patients:   StrictlyIdeas.no  Fact Sheet for Healthcare Providers: BankingDealers.co.za  This test is not yet approved or  cleared by the [place] FDA and has been authorized for detection and/or diagnosis of SARS-CoV-2 by FDA under an Emergency Use Authorization (EUA).  This EUA will remain in effect (meaning this test can be used) for the duration of the COVID-19 declaration under Section 564(b)(1) of the Act, 21 U.S.C. section 360bbb-3(b)(1), unless the authorization is terminated or revoked sooner.  Performed at [facility], [address] [place], [place] [address]      Total time spend on discharging this patient, including the last patient exam, discussing the hospital stay, instructions for ongoing care as it relates to all pertinent caregivers, as well as preparing the medical discharge records, prescriptions, and/or referrals as applicable, is 35 minutes.    Enzo Bi, MD  [facility] 01/19/2020, 9:20 AM  If 7PM-7AM, please contact night-coverage

## 2020-01-19 NOTE — Discharge Instructions (Signed)
Acute Urinary Retention, Male  Acute urinary retention is a condition in which a person is unable to pass urine. This can last for a short time or for a long time. If left untreated, it can result in kidney damage or other serious complications. What are the causes? This condition may be caused by:  Obstruction or narrowing of the tube that drains the bladder (urethra). This may be caused by surgery or problems with nearby organs, such as the prostate gland, which can press or squeeze the urethra.  Problems with the nerves in the bladder. These can be caused by diseases, such as multiple sclerosis, or by spinal cord injuries.  Certain medicines.  Tumors in the area of the pelvis, bladder, or urethra.  Diabetes.  Degenerative cognitive conditions such as delirium or dementia.  Bladder or urinary tract infection.  Constipation.  Blood in the urine (hematuria).  Injury to the bladder or urethra.  Psychological (psychogenic) conditions. Someone may hold his urine due to trauma or because he does not want to use the bathroom. What increases the risk? This condition is more likely to develop in older men. As men age, their prostate may become larger and may start pressing or squeezing on the bladder or the urethra. What are the signs or symptoms? Symptoms of this condition include:  Trouble urinating.  Pain in the lower abdomen. Symptoms usually come on slowly over a long period of time. How is this diagnosed? This condition is diagnosed based on a physical exam and a medical history. You may also have other tests, including:  An ultrasound of the bladder or kidneys or both.  Blood tests.  A urine analysis.  Additional tests may be needed such as an MRI, kidney, or bladder function tests. How is this treated? Treatment for this condition may include:  Medicines.  Placing a thin, sterile tube (catheter) into the bladder to drain urine out of the body. This is called an  indwelling urinary catheter. After being inserted, the catheter is held in place with a small balloon that is filled with sterile water. Urine drains from the catheter into a collection bag outside of the body.  Behavioral therapy.  Treatment for any underlying conditions.  If needed, you may be treated in the hospital for kidney function problems or to manage other complications. Follow these instructions at home:  Take over-the-counter and prescription medicines only as told by your health care provider. Avoid certain medicines, such as decongestants, antihistamines, and some prescription medicines. Do not take any medicine unless your health care provider has approved.  If you were given an indwelling urinary catheter, take care of it as told by your health care provider.  Drink enough fluid to keep your urine clear or pale yellow.  If you were prescribed an antibiotic, take it as told by your health care provider. Do not stop taking the antibiotic even if you start to feel better.  Do not use any products that contain nicotine or tobacco, such as cigarettes and e-cigarettes. If you need help quitting, ask your health care provider.  Monitor any changes in your symptoms. Tell your health care provider about any changes.  If instructed, monitor your blood pressure at home. Report changes as told by your health care provider.  Keep all follow-up visits as told by your health care provider. This is important. Contact a health care provider if:  You have uncomfortable bladder contractions that you cannot control (spasms) or you leak urine with the spasms.   Get help right away if:  You have chills or fever.  You have blood in your urine.  You have a catheter and: ? Your catheter stops draining urine. ? Your catheter falls out. Summary  Acute urinary retention is a condition in which a person is unable to pass urine. If left untreated, it can result in kidney damage or other serious  complications.  The cause of this condition may include an enlarged prostate. As men age, their prostate gland may become larger and may start pressing or squeezing on the bladder or the urethra.  Treatment for this condition may include medicines and placement of an indwelling urinary catheter.  Monitor any changes in your symptoms. Tell your health care provider about any changes. This information is not intended to replace advice given to you by your health care provider. Make sure you discuss any questions you have with your health care provider. Document Revised: 06/10/2017 Document Reviewed: 07/30/2016 Elsevier Patient Education  2020 Elsevier Inc.  

## 2020-01-23 ENCOUNTER — Encounter: Payer: Self-pay | Admitting: *Deleted

## 2020-01-23 ENCOUNTER — Encounter: Payer: Self-pay | Admitting: Physician Assistant

## 2020-01-23 ENCOUNTER — Other Ambulatory Visit: Payer: Self-pay

## 2020-01-23 ENCOUNTER — Ambulatory Visit (INDEPENDENT_AMBULATORY_CARE_PROVIDER_SITE_OTHER): Payer: Medicare HMO | Admitting: Physician Assistant

## 2020-01-23 ENCOUNTER — Ambulatory Visit: Payer: Self-pay | Admitting: Physician Assistant

## 2020-01-23 VITALS — BP 106/61 | HR 101 | Ht 68.0 in | Wt 140.0 lb

## 2020-01-23 DIAGNOSIS — N401 Enlarged prostate with lower urinary tract symptoms: Secondary | ICD-10-CM | POA: Diagnosis not present

## 2020-01-23 DIAGNOSIS — R338 Other retention of urine: Secondary | ICD-10-CM

## 2020-01-23 LAB — BLADDER SCAN AMB NON-IMAGING

## 2020-01-23 NOTE — Progress Notes (Signed)
Fill and Pull Catheter Removal  Patient is present today for a catheter removal.  Patient was cleaned and prepped in a sterile fashion 158ml of sterile water was instilled into the bladder when the patient felt the urge to urinate. 68ml of water was then drained from the balloon.  A 14FR foley cath was removed from the bladder no complications were noted .  Patient was then given some time to void on their own.  Patient cannot void on their own after some time.  Patient tolerated well.  Performed by: Debroah Loop, PA-C   Follow up/ Additional notes: Push fluids and RTC this afternoon for PVR.

## 2020-01-23 NOTE — Progress Notes (Signed)
Simple Catheter Placement  Due to urinary retention patient is present today for a foley cath placement.  Patient was cleaned and prepped in a sterile fashion with betadine and 2% lidocaine jelly was instilled into the urethra. A 16 FR coude foley catheter was inserted, urine return was noted  25ml, urine was yellow in color.  The balloon was filled with 10cc of sterile water.  A leg bag was attached for drainage. Patient was also given a night bag to take home and was given instruction on how to change from one bag to another.  Patient was given instruction on proper catheter care.  Patient tolerated well, no complications were noted   Performed by: Debroah Loop, PA-C

## 2020-01-23 NOTE — Progress Notes (Signed)
01/23/2020 3:07 PM   [name] [birth year] [id number]  CC: Chief Complaint  Patient presents with  . Urinary Retention    V & T    HPI: Donald Foster is a 74 y.o. male with PMH BPH on Flomax recently admitted for acute urinary retention with acute renal failure requiring Foley placement with development of gross hematuria and postobstructive diuresis following bladder decompression who presents today for outpatient voiding trial. Notably, he also reported constipation with AUR onset.  Today he reports doing well. He is tolerating Foley without problems. He has had two BMs since discharge.  Patient has a history of elevated residuals, most recently recorded to be 137m in 2016.  He is scheduled for cystoscopy with Dr. BErlene Quanin 2 weeks.  PMH: Past Medical History:  Diagnosis Date  . [place]  . COPD (chronic obstructive pulmonary disease) ([place]   . GERD (gastroesophageal reflux disease)   . Hypercholesteremia   . Hypertension     Surgical History: Past Surgical History:  Procedure Laterality Date  . CHOLECYSTECTOMY    . COLONOSCOPY WITH PROPOFOL N/A 08/17/2018   Procedure: COLONOSCOPY WITH PROPOFOL;  Surgeon: VLin Landsman MD;  [facility];  Service: Gastroenterology;  Laterality: N/A;  . ESOPHAGOGASTRODUODENOSCOPY (EGD) WITH PROPOFOL N/A 08/01/2018   Procedure: ESOPHAGOGASTRODUODENOSCOPY (EGD) WITH PROPOFOL;  Surgeon: VLin Landsman MD;  [facility];  Service: Gastroenterology;  Laterality: N/A;  . HEMORRHOID SURGERY N/A 02/22/2019   Procedure: HEMORRHOIDECTOMY;  Surgeon: PJules Husbands MD;  Location: [facility] ORS;  Service: General;  Laterality: N/A;  . UPPER GI ENDOSCOPY  08/17/2018   Procedure: UPPER GI ENDOSCOPY;  Surgeon: VLin Landsman MD;  Location: [facility] ENDOSCOPY;  Service: Gastroenterology;;    Home Medications:  Allergies as of 01/23/2020   No Known Allergies     Medication  List       Accurate as of January 23, 2020  3:07 PM. If you have any questions, ask your nurse or doctor.        albuterol 108 (90 Base) MCG/ACT inhaler Commonly known as: VENTOLIN HFA Inhale 2 puffs into the lungs every 4 (four) hours as needed for wheezing or shortness of breath. Every 4-6 hrs prn   aluminum hydroxide-magnesium carbonate 95-358 MG/15ML Susp Commonly known as: Gaviscon Take 15 mLs by mouth 4 (four) times daily - after meals and at bedtime.   amLODipine 10 MG tablet Commonly known as: NORVASC Take 10 mg by mouth daily.   atorvastatin 20 MG tablet Commonly known as: LIPITOR Take 20 mg by mouth at bedtime.   Fluticasone-Salmeterol 100-50 MCG/DOSE Aepb Commonly known as: ADVAIR Inhale 1 puff into the lungs 2 (two) times daily.   losartan 50 MG tablet Commonly known as: COZAAR Hold until outpatient doctor followup, due to acute kidney injury.   omeprazole 40 MG capsule Commonly known as: PRILOSEC Take 1 capsule (40 mg total) by mouth 2 (two) times daily before a meal.   Spiriva HandiHaler 18 MCG inhalation capsule Generic drug: tiotropium Place 18 mcg into inhaler and inhale daily.   tamsulosin 0.4 MG Caps capsule Commonly known as: FLOMAX Take 0.4 mg by mouth daily.       Allergies:  No Known Allergies  Family History: Family History  Problem Relation Age of Onset  . Cancer Father     Social History:   reports that he has quit smoking. His smoking use included cigarettes. He started smoking about 22 months  ago. He has a 42.75 pack-year smoking history. He has never used smokeless tobacco. He reports previous alcohol use. He reports previous drug use.  Physical Exam: BP 106/61   Pulse (!) 101   Ht 5' 8"  (1.727 m)   Wt 140 lb (63.5 kg)   BMI 21.29 kg/m   Constitutional:  Alert and oriented, no acute distress, nontoxic appearing HEENT: [place], AT Cardiovascular: No clubbing, cyanosis, or edema Respiratory: Normal respiratory effort, no increased  work of breathing Skin: No rashes, bruises or suspicious lesions Neurologic: Grossly intact, no focal deficits, moving all 4 extremities Psychiatric: Normal mood and affect  Laboratory Data: Results for orders placed or performed in visit on 01/23/20  BLADDER SCAN AMB NON-IMAGING  Result Value Ref Range   Scan Result 486m    Assessment & Plan:   1. Benign prostatic hyperplasia with urinary retention Fill and pull voiding trial completed in the morning, see separate procedure note for details.  Patient returned to clinic this afternoon for repeat PVR. He reports drinking approximately 16oz of fluid. He has not been able to urinate. PVR 4343m  Voiding trial failed. Offered patient CIC teaching versus Foley catheter replacement. He elected for Foley placement, see separate procedure note for details. Notably, I met resistance with insertion of the 16Fr coude catheter along the penile vs bulbar urethra consistent with possible stricture.  I encouraged patient to keep his scheduled cystoscopy appointment with Dr. BrErlene Quann 2 weeks. Will add on TRUS for prostate measurement for BOO consultation at that visit.  Return in about 2 weeks (around 02/06/2020) for Cysto and TRUS (bladder outlet consultation) with Dr. BrErlene Quan SaDebroah LoopPA-C  [facility] [address] [phone]

## 2020-02-05 NOTE — Progress Notes (Signed)
02/06/2020  Chief Complaint  Patient presents with   Cysto/TRUS     HPI: Donald Foster is a 74 y.o. 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   Patient was recently admitted earlier this month for acute urinary retention with acute renal failure requiring Foley placement with development of gross hematuria and postobstructive diuresis following bladder decompression.  He had a voiding trial with Sharyn Lull, PA-C on 01/23/2020. PVR was 435 mL. Voiding trial failed. He was offered CIC teaching versus Foley catheter replacement. He elected for Foley placement. She met resistance with insertion of the 16Fr coude catheter along the penile vs bulbar urethra consistent with possible stricture. (see note for details)     Patient has a history of elevated residuals.  Please see previous notes for details.    Patient reports having trouble urinating on his own.     NED. A&Ox3.   No respiratory distress   Abd soft, NT, ND Normal phallus with bilateral descended testicles    Cystoscopy Procedure Note  Patient identification was confirmed, informed consent was obtained, and patient was prepped using Betadine solution.  Lidocaine jelly was administered per urethral meatus.    Preoperative abx where received prior to procedure.     Pre-Procedure: - Inspection reveals a normal caliber ureteral meatus.  Procedure: The flexible cystoscope was introduced without difficulty - No urethral strictures/lesions are present. - Enlarged prostate  - Elevated bladder neck with a well circumcised median lobe on retroflexion - Bilateral ureteral orifices identified - Bladder mucosa reveals diffuse catheterization cystitis with no papillary change   - No bladder stones - Heavily trabeculated   Retroflexion with median lobe as above   Post-Procedure: - Patient tolerated the procedure well    Prostate transrectal ultrasound sizing   Informed consent was  obtained after discussing risks/benefits of the procedure.  A time out was performed to ensure correct patient identity.   Pre-Procedure: -Transrectal probe was placed without difficulty -Transrectal Ultrasound performed revealing a 59.7 gm prostate measuring 4.64 x 4.9 x 5.02 cm (length) -No significant hypoechoic -Measurements were adjusted for median lobe      Assessment/ Plan:  1. BPH with urinary retention  Attempted a voiding trial today- failed.  Refused CIC.  Foley replaced.  Discussed the treatment HoLEP vs TURP   We reviewed the surgery in detail today including the preoperative, intraoperative, and postoperative course.  This will most likely be an outpatient procedure pending the degree of post op hematuria.  He will go home with catheter for a few days post op and will either be taught how to remove his own catheter or return to the office for catheter removal.  Risk of bleeding, infection, damage surrounding structures, injury to the bladder/ urethral, bladder neck contracture, ureteral stricture, retrograde ejaculation, stress/ urge incontinence, exacerbation of irritative voiding symptoms were all discussed in detail.    Patient agreed to HoLEP.  2. Gross hematuria Suspected to be related to catheter trauma and stretch injury of the bladder. 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 Hollice Espy.  I have reviewed the above documentation for accuracy and completeness, and I agree with the above.   Hollice Espy, MD

## 2020-02-05 NOTE — H&P (View-Only) (Signed)
02/06/2020  Chief Complaint  Patient presents with   Cysto/TRUS     HPI: Donald Foster is a 74 y.o. 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   Patient was recently admitted earlier this month for acute urinary retention with acute renal failure requiring Foley placement with development of gross hematuria and postobstructive diuresis following bladder decompression.  He had a voiding trial with Sharyn Lull, PA-C on 01/23/2020. PVR was 435 mL. Voiding trial failed. He was offered CIC teaching versus Foley catheter replacement. He elected for Foley placement. She met resistance with insertion of the 16Fr coude catheter along the penile vs bulbar urethra consistent with possible stricture. (see note for details)     Patient has a history of elevated residuals.  Please see previous notes for details.    Patient reports having trouble urinating on his own.     NED. A&Ox3.   No respiratory distress   Abd soft, NT, ND Normal phallus with bilateral descended testicles    Cystoscopy Procedure Note  Patient identification was confirmed, informed consent was obtained, and patient was prepped using Betadine solution.  Lidocaine jelly was administered per urethral meatus.    Preoperative abx where received prior to procedure.     Pre-Procedure: - Inspection reveals a normal caliber ureteral meatus.  Procedure: The flexible cystoscope was introduced without difficulty - No urethral strictures/lesions are present. - Enlarged prostate  - Elevated bladder neck with a well circumcised median lobe on retroflexion - Bilateral ureteral orifices identified - Bladder mucosa reveals diffuse catheterization cystitis with no papillary change   - No bladder stones - Heavily trabeculated   Retroflexion with median lobe as above   Post-Procedure: - Patient tolerated the procedure well    Prostate transrectal ultrasound sizing   Informed consent was  obtained after discussing risks/benefits of the procedure.  A time out was performed to ensure correct patient identity.   Pre-Procedure: -Transrectal probe was placed without difficulty -Transrectal Ultrasound performed revealing a 59.7 gm prostate measuring 4.64 x 4.9 x 5.02 cm (length) -No significant hypoechoic -Measurements were adjusted for median lobe      Assessment/ Plan:  1. BPH with urinary retention  Attempted a voiding trial today- failed.  Refused CIC.  Foley replaced.  Discussed the treatment HoLEP vs TURP   We reviewed the surgery in detail today including the preoperative, intraoperative, and postoperative course.  This will most likely be an outpatient procedure pending the degree of post op hematuria.  He will go home with catheter for a few days post op and will either be taught how to remove his own catheter or return to the office for catheter removal.  Risk of bleeding, infection, damage surrounding structures, injury to the bladder/ urethral, bladder neck contracture, ureteral stricture, retrograde ejaculation, stress/ urge incontinence, exacerbation of irritative voiding symptoms were all discussed in detail.    Patient agreed to HoLEP.  2. Gross hematuria Suspected to be related to catheter trauma and stretch injury of the bladder. 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 Hollice Espy.  I have reviewed the above documentation for accuracy and completeness, and I agree with the above.   Hollice Espy, MD

## 2020-02-06 ENCOUNTER — Other Ambulatory Visit: Payer: Self-pay | Admitting: Radiology

## 2020-02-06 ENCOUNTER — Ambulatory Visit (INDEPENDENT_AMBULATORY_CARE_PROVIDER_SITE_OTHER): Payer: Medicare HMO | Admitting: Urology

## 2020-02-06 DIAGNOSIS — R338 Other retention of urine: Secondary | ICD-10-CM | POA: Diagnosis not present

## 2020-02-06 DIAGNOSIS — N401 Enlarged prostate with lower urinary tract symptoms: Secondary | ICD-10-CM | POA: Diagnosis not present

## 2020-02-06 NOTE — Patient Instructions (Signed)

## 2020-02-06 NOTE — Progress Notes (Signed)
Catheter Removal  Patient is present today for a catheter removal.  5ml of water was drained from the balloon. A 16FR coude foley cath was removed from the bladder no complications were noted . Patient tolerated well.  Performed by: Verlene Mayer, [place]

## 2020-02-06 NOTE — Progress Notes (Signed)
Simple Catheter Placement  Due to urinary retention patient is present today for a foley cath placement.  Patient was cleaned and prepped in a sterile fashion with betadine. A 16 FR coude foley catheter was inserted, urine return was noted  367ml, urine was yellow in color.  The balloon was filled with 10cc of sterile water.  A leg bag was attached for drainage. Patient was also given a night bag to take home and was given instruction on how to change from one bag to another.  Patient was given instruction on proper catheter care.  Patient tolerated well, no complications were noted   Performed by: Verlene Mayer, CMA

## 2020-02-07 LAB — URINALYSIS, COMPLETE
Bilirubin, UA: NEGATIVE
Glucose, UA: NEGATIVE
Ketones, UA: NEGATIVE
Leukocytes,UA: NEGATIVE
Nitrite, UA: NEGATIVE
Specific Gravity, UA: 1.02 (ref 1.005–1.030)
Urobilinogen, Ur: 0.2 mg/dL (ref 0.2–1.0)
pH, UA: 5 (ref 5.0–7.5)

## 2020-02-07 LAB — MICROSCOPIC EXAMINATION

## 2020-02-12 ENCOUNTER — Inpatient Hospital Stay: Admission: RE | Admit: 2020-02-12 | Payer: Medicare HMO | Source: Ambulatory Visit

## 2020-02-13 ENCOUNTER — Encounter
Admission: RE | Admit: 2020-02-13 | Discharge: 2020-02-13 | Disposition: A | Discharge status: Home / Self Care | Payer: Medicare HMO | Source: Ambulatory Visit | Attending: Urology | Admitting: Urology

## 2020-02-13 NOTE — Patient Instructions (Signed)
Your procedure is scheduled on: Monday 02/18/20.  Report to DAY SURGERY DEPARTMENT LOCATED ON 2ND FLOOR MEDICAL MALL ENTRANCE. To find out your arrival time please call [phone] between 1PM - 3PM on Friday 02/15/20.   Remember: Instructions that are not followed completely may result in serious medical risk, up to and including death, or upon the discretion of your surgeon and anesthesiologist your surgery may need to be rescheduled.     __X__ 1. Do not eat food after midnight the night before your procedure.                 No gum chewing or hard candies. You may drink clear liquids up to 2 hours                 before you are scheduled to arrive for your surgery- DO NOT drink clear                 liquids within 2 hours of the start of your surgery.                 Clear Liquids include:  water, apple juice without pulp, clear carbohydrate                 drink such as Clearfast or Gatorade, Black Coffee or Tea (Do not add                 milk or creamer to coffee or tea).  __X__2.  On the morning of surgery brush your teeth with toothpaste and water, you may rinse your mouth with mouthwash if you wish.  Do not swallow any toothpaste or mouthwash.    __X__ 3.  No Alcohol for 24 hours before or after surgery.  __X__ 4.  Do Not Smoke or use e-cigarettes For 24 Hours Prior to Your Surgery.                 Do not use any chewable tobacco products for at least 6 hours prior to                 surgery.  __X__5.  Notify your doctor if there is any change in your medical condition      (cold, fever, infections).      Do NOT wear jewelry, make-up, hairpins, clips or nail polish. Do NOT wear lotions, powders, or perfumes.  Do NOT shave 48 hours prior to surgery. Men may shave face and neck. Do NOT bring valuables to the hospital.     [facility] is not responsible for any belongings or valuables.   Contacts, dentures/partials or body piercings may not be worn into surgery. Bring a  case for your contacts, glasses or hearing aids, a denture cup will be supplied. Leave your suitcase in the car. After surgery it may be brought to your room.   For patients admitted to the hospital, discharge time is determined by your treatment team.    Patients discharged the day of surgery will not be allowed to drive home.     __X__ Take these medicines the morning of surgery with A SIP OF WATER:     1. albuterol (VENTOLIN HFA)   2. amLODipine (NORVASC)  3. Fluticasone-Salmeterol (ADVAIR)   4. omeprazole (PRILOSEC)  5. SPIRIVA HANDIHALER   6. tamsulosin (FLOMAX)    __X__ Use inhalers on the day of surgery. Also bring the inhaler with you to the hospital on the morning of surgery.  __X__ Stop Anti-inflammatories 7 days before surgery such as Advil, Ibuprofen, Motrin, BC or Goodies Powder, Naprosyn, Naproxen, Aleve, Aspirin, Meloxicam. May take Tylenol if needed for pain or discomfort.   __X__Do not start taking any new herbal supplements or vitamins prior to your procedure.    Wear comfortable clothing (specific to your surgery type) to the hospital.  Plan for stool softeners for home use; pain medications have a tendency to cause constipation. You can also help prevent constipation by eating foods high in fiber such as fruits and vegetables and drinking plenty of fluids as your diet allows.  After surgery, you can prevent lung complications by doing breathing exercises.Take deep breaths and cough every 1-2 hours. Your doctor may order a device called an Incentive Spirometer to help you take deep breaths.  Please call the [place] Department at [phone] if you have any questions about these instructions

## 2020-02-13 NOTE — Pre-Procedure Instructions (Signed)
PAT has attempted to reach the patient for two days at both phone numbers provided. Dr. Cherrie Gauze office aware.

## 2020-02-14 ENCOUNTER — Other Ambulatory Visit: Payer: Self-pay

## 2020-02-14 ENCOUNTER — Other Ambulatory Visit
Admission: RE | Admit: 2020-02-14 | Discharge: 2020-02-14 | Disposition: A | Discharge status: Home / Self Care | Payer: Medicare HMO | Source: Ambulatory Visit | Attending: Urology | Admitting: Urology

## 2020-02-14 DIAGNOSIS — Z20822 Contact with and (suspected) exposure to covid-19: Secondary | ICD-10-CM | POA: Insufficient documentation

## 2020-02-14 DIAGNOSIS — Z01812 Encounter for preprocedural laboratory examination: Secondary | ICD-10-CM | POA: Insufficient documentation

## 2020-02-15 ENCOUNTER — Telehealth: Payer: Self-pay

## 2020-02-15 ENCOUNTER — Telehealth: Payer: Self-pay | Admitting: Radiology

## 2020-02-15 DIAGNOSIS — N390 Urinary tract infection, site not specified: Secondary | ICD-10-CM

## 2020-02-15 LAB — CULTURE, URINE COMPREHENSIVE

## 2020-02-15 LAB — SARS CORONAVIRUS 2 (TAT 6-24 HRS): SARS Coronavirus 2: NEGATIVE

## 2020-02-15 MED ORDER — CIPROFLOXACIN HCL 500 MG PO TABS: 500.0000 mg | ORAL_TABLET | Freq: Two times a day (BID) | ORAL | 0 refills | Status: DC

## 2020-02-15 NOTE — Telephone Encounter (Signed)
Spoke with patient's sister and she was previously notified

## 2020-02-15 NOTE — Telephone Encounter (Signed)
-----   Message from Hollice Espy, MD sent at 02/15/2020 11:59 AM EDT ----- Please treat preop starting today with cipro 500 mg bid x 7 days  Hollice Espy, MD

## 2020-02-15 NOTE — Telephone Encounter (Signed)
Notified patient's sister of script sent to pharmacy.

## 2020-02-18 ENCOUNTER — Ambulatory Visit: Payer: Medicare HMO | Admitting: Certified Registered Nurse Anesthetist

## 2020-02-18 ENCOUNTER — Other Ambulatory Visit: Payer: Self-pay

## 2020-02-18 ENCOUNTER — Encounter: Payer: Self-pay | Admitting: Urology

## 2020-02-18 ENCOUNTER — Ambulatory Visit
Admission: RE | Admit: 2020-02-18 | Discharge: 2020-02-18 | Disposition: A | Discharge status: Home / Self Care | Payer: Medicare HMO | Attending: Urology | Admitting: Urology

## 2020-02-18 ENCOUNTER — Encounter
Admission: RE | Disposition: A | Discharge status: Home / Self Care | Payer: Self-pay | Source: Home / Self Care | Attending: Urology

## 2020-02-18 DIAGNOSIS — N138 Other obstructive and reflux uropathy: Secondary | ICD-10-CM | POA: Insufficient documentation

## 2020-02-18 DIAGNOSIS — Z79899 Other long term (current) drug therapy: Secondary | ICD-10-CM | POA: Diagnosis not present

## 2020-02-18 DIAGNOSIS — R338 Other retention of urine: Secondary | ICD-10-CM | POA: Diagnosis not present

## 2020-02-18 DIAGNOSIS — N400 Enlarged prostate without lower urinary tract symptoms: Secondary | ICD-10-CM | POA: Diagnosis not present

## 2020-02-18 DIAGNOSIS — N320 Bladder-neck obstruction: Secondary | ICD-10-CM | POA: Diagnosis not present

## 2020-02-18 DIAGNOSIS — I10 Essential (primary) hypertension: Secondary | ICD-10-CM | POA: Insufficient documentation

## 2020-02-18 DIAGNOSIS — N3090 Cystitis, unspecified without hematuria: Secondary | ICD-10-CM | POA: Diagnosis not present

## 2020-02-18 DIAGNOSIS — N3289 Other specified disorders of bladder: Secondary | ICD-10-CM | POA: Diagnosis not present

## 2020-02-18 DIAGNOSIS — N401 Enlarged prostate with lower urinary tract symptoms: Secondary | ICD-10-CM | POA: Insufficient documentation

## 2020-02-18 DIAGNOSIS — Z87891 Personal history of nicotine dependence: Secondary | ICD-10-CM | POA: Insufficient documentation

## 2020-02-18 DIAGNOSIS — J449 Chronic obstructive pulmonary disease, unspecified: Secondary | ICD-10-CM | POA: Insufficient documentation

## 2020-02-18 HISTORY — DX: Benign prostatic hyperplasia without lower urinary tract symptoms: N40.0

## 2020-02-18 HISTORY — PX: HOLEP-LASER ENUCLEATION OF THE PROSTATE WITH MORCELLATION: SHX6641

## 2020-02-18 SURGERY — ENUCLEATION, PROSTATE, USING LASER, WITH MORCELLATION
Anesthesia: General | Site: Prostate

## 2020-02-18 MED ORDER — HYDROCODONE-ACETAMINOPHEN 5-325 MG PO TABS: 1.0000 | ORAL_TABLET | Freq: Four times a day (QID) | ORAL | 0 refills | Status: DC | PRN

## 2020-02-18 MED ORDER — LIDOCAINE HCL (PF) 2 % IJ SOLN
INTRAMUSCULAR | Status: AC
  Filled 2020-02-18: qty 5

## 2020-02-18 MED ORDER — FENTANYL CITRATE (PF) 100 MCG/2ML IJ SOLN
INTRAMUSCULAR | Status: AC
  Filled 2020-02-18: qty 2

## 2020-02-18 MED ORDER — FENTANYL CITRATE (PF) 100 MCG/2ML IJ SOLN: 25.0000 ug | INTRAMUSCULAR | Status: DC | PRN

## 2020-02-18 MED ORDER — MEPERIDINE HCL 50 MG/ML IJ SOLN: 6.2500 mg | INTRAMUSCULAR | Status: DC | PRN

## 2020-02-18 MED ORDER — ONDANSETRON HCL 4 MG/2ML IJ SOLN
INTRAMUSCULAR | Status: DC | PRN
  Administered 2020-02-18: 4 mg via INTRAVENOUS

## 2020-02-18 MED ORDER — CEFAZOLIN SODIUM-DEXTROSE 2-4 GM/100ML-% IV SOLN
INTRAVENOUS | Status: AC
  Filled 2020-02-18: qty 100

## 2020-02-18 MED ORDER — MIDAZOLAM HCL 2 MG/2ML IJ SOLN
INTRAMUSCULAR | Status: AC
  Filled 2020-02-18: qty 2

## 2020-02-18 MED ORDER — OXYBUTYNIN CHLORIDE 5 MG PO TABS
5.0000 mg | ORAL_TABLET | Freq: Three times a day (TID) | ORAL | 0 refills | Status: DC | PRN
Start: 2020-02-18 — End: 2020-04-01

## 2020-02-18 MED ORDER — CHLORHEXIDINE GLUCONATE 0.12 % MT SOLN
OROMUCOSAL | Status: AC
  Administered 2020-02-18: 15 mL via OROMUCOSAL
  Filled 2020-02-18: qty 15

## 2020-02-18 MED ORDER — SUGAMMADEX SODIUM 200 MG/2ML IV SOLN
INTRAVENOUS | Status: DC | PRN
  Administered 2020-02-18: 200 mg via INTRAVENOUS

## 2020-02-18 MED ORDER — FUROSEMIDE 10 MG/ML IJ SOLN
INTRAMUSCULAR | Status: DC | PRN
Start: 2020-02-18 — End: 2020-02-18
  Administered 2020-02-18: 10 mg via INTRAVENOUS

## 2020-02-18 MED ORDER — PHENYLEPHRINE HCL (PRESSORS) 10 MG/ML IV SOLN
INTRAVENOUS | Status: DC | PRN
  Administered 2020-02-18 (×5): 100 ug via INTRAVENOUS

## 2020-02-18 MED ORDER — FAMOTIDINE 20 MG PO TABS
ORAL_TABLET | ORAL | Status: AC
  Administered 2020-02-18: 20 mg via ORAL
  Filled 2020-02-18: qty 1

## 2020-02-18 MED ORDER — PROPOFOL 10 MG/ML IV BOLUS
INTRAVENOUS | Status: AC
  Filled 2020-02-18: qty 20

## 2020-02-18 MED ORDER — ROCURONIUM BROMIDE 100 MG/10ML IV SOLN
INTRAVENOUS | Status: DC | PRN
  Administered 2020-02-18: 20 mg via INTRAVENOUS
  Administered 2020-02-18: 40 mg via INTRAVENOUS

## 2020-02-18 MED ORDER — ROCURONIUM BROMIDE 10 MG/ML (PF) SYRINGE
PREFILLED_SYRINGE | INTRAVENOUS | Status: AC
  Filled 2020-02-18: qty 10

## 2020-02-18 MED ORDER — DEXAMETHASONE SODIUM PHOSPHATE 10 MG/ML IJ SOLN
INTRAMUSCULAR | Status: DC | PRN
  Administered 2020-02-18: 6 mg via INTRAVENOUS

## 2020-02-18 MED ORDER — LACTATED RINGERS IV SOLN
INTRAVENOUS | Status: DC
  Administered 2020-02-18: 10 mL/h via INTRAVENOUS

## 2020-02-18 MED ORDER — OXYCODONE HCL 5 MG PO TABS: 5.0000 mg | ORAL_TABLET | Freq: Once | ORAL | Status: DC | PRN

## 2020-02-18 MED ORDER — CHLORHEXIDINE GLUCONATE 0.12 % MT SOLN: 15.0000 mL | Freq: Once | OROMUCOSAL | Status: AC

## 2020-02-18 MED ORDER — FAMOTIDINE 20 MG PO TABS: 20.0000 mg | ORAL_TABLET | Freq: Once | ORAL | Status: AC

## 2020-02-18 MED ORDER — CEFAZOLIN SODIUM-DEXTROSE 2-4 GM/100ML-% IV SOLN
2.0000 g | INTRAVENOUS | Status: AC
  Administered 2020-02-18: 2 g via INTRAVENOUS

## 2020-02-18 MED ORDER — MIDAZOLAM HCL 2 MG/2ML IJ SOLN
INTRAMUSCULAR | Status: DC | PRN
  Administered 2020-02-18: 2 mg via INTRAVENOUS

## 2020-02-18 MED ORDER — FUROSEMIDE 10 MG/ML IJ SOLN
INTRAMUSCULAR | Status: AC
  Filled 2020-02-18: qty 4

## 2020-02-18 MED ORDER — ONDANSETRON HCL 4 MG/2ML IJ SOLN
INTRAMUSCULAR | Status: AC
  Filled 2020-02-18: qty 2

## 2020-02-18 MED ORDER — ORAL CARE MOUTH RINSE: 15.0000 mL | Freq: Once | OROMUCOSAL | Status: AC

## 2020-02-18 MED ORDER — FENTANYL CITRATE (PF) 250 MCG/5ML IJ SOLN
INTRAMUSCULAR | Status: DC | PRN
  Administered 2020-02-18: 25 ug via INTRAVENOUS
  Administered 2020-02-18: 50 ug via INTRAVENOUS
  Administered 2020-02-18: 25 ug via INTRAVENOUS

## 2020-02-18 MED ORDER — PROPOFOL 10 MG/ML IV BOLUS
INTRAVENOUS | Status: DC | PRN
  Administered 2020-02-18: 100 mg via INTRAVENOUS

## 2020-02-18 MED ORDER — DEXAMETHASONE SODIUM PHOSPHATE 10 MG/ML IJ SOLN
INTRAMUSCULAR | Status: AC
  Filled 2020-02-18: qty 1

## 2020-02-18 MED ORDER — OXYCODONE HCL 5 MG/5ML PO SOLN: 5.0000 mg | Freq: Once | ORAL | Status: DC | PRN

## 2020-02-18 MED ORDER — LIDOCAINE HCL (CARDIAC) PF 100 MG/5ML IV SOSY
PREFILLED_SYRINGE | INTRAVENOUS | Status: DC | PRN
  Administered 2020-02-18: 40 mg via INTRAVENOUS

## 2020-02-18 MED ORDER — PROMETHAZINE HCL 25 MG/ML IJ SOLN: 6.2500 mg | INTRAMUSCULAR | Status: DC | PRN

## 2020-02-18 SURGICAL SUPPLY — 39 items
ADAPTER IRRIG TUBE 2 SPIKE SOL (ADAPTER) ×6 IMPLANT
ADPR TBG 2 SPK PMP STRL ASCP (ADAPTER) ×2
BAG DRN LRG CPC RND TRDRP CNTR (MISCELLANEOUS)
BAG DRN RND TRDRP ANRFLXCHMBR (UROLOGICAL SUPPLIES) ×1
BAG URINE DRAIN 2000ML AR STRL (UROLOGICAL SUPPLIES) ×2 IMPLANT
BAG URO DRAIN 4000ML (MISCELLANEOUS) IMPLANT
CATH FOL 2WAY LX 20X30 (CATHETERS) IMPLANT
CATH FOL 2WAY LX 22X30 (CATHETERS) ×2 IMPLANT
CATH FOLEY 3WAY 30CC 22FR (CATHETERS) IMPLANT
CATH URETL 5X70 OPEN END (CATHETERS) ×3 IMPLANT
CONTAINER COLLECT MORCELLATR (MISCELLANEOUS) ×1 IMPLANT
DRAPE 3/4 80X56 (DRAPES) ×3 IMPLANT
DRAPE UTILITY 15X26 TOWEL STRL (DRAPES) IMPLANT
ELECT REM PT RETURN 9FT ADLT (ELECTROSURGICAL) ×3
ELECTRODE REM PT RTRN 9FT ADLT (ELECTROSURGICAL) IMPLANT
FILTER OVERFLOW MORCELLATOR (FILTER) ×1 IMPLANT
GLOVE BIO SURGEON STRL SZ 6.5 (GLOVE) ×4 IMPLANT
GLOVE BIO SURGEONS STRL SZ 6.5 (GLOVE) ×2
GOWN STRL REUS W/ TWL LRG LVL3 (GOWN DISPOSABLE) ×2 IMPLANT
GOWN STRL REUS W/TWL LRG LVL3 (GOWN DISPOSABLE) ×6
HOLDER FOLEY CATH W/STRAP (MISCELLANEOUS) ×3 IMPLANT
KIT TURNOVER CYSTO (KITS) ×3 IMPLANT
LASER FIBER FLEXIVA 550 (UROLOGICAL SUPPLIES) ×3 IMPLANT
MBRN O SEALING YLW 17 FOR INST (MISCELLANEOUS) ×3
MEMBRANE SLNG YLW 17 FOR INST (MISCELLANEOUS) ×1 IMPLANT
MORCELLATOR COLLECT CONTAINER (MISCELLANEOUS) ×3
MORCELLATOR OVERFLOW FILTER (FILTER) ×3
MORCELLATOR ROTATION 4.75 335 (MISCELLANEOUS) ×3 IMPLANT
PACK CYSTO AR (MISCELLANEOUS) ×3 IMPLANT
SET CYSTO W/LG BORE CLAMP LF (SET/KITS/TRAYS/PACK) IMPLANT
SET IRRIG Y TYPE TUR BLADDER L (SET/KITS/TRAYS/PACK) ×3 IMPLANT
SLEEVE PROTECTION STRL DISP (MISCELLANEOUS) ×6 IMPLANT
SOL .9 NS 3000ML IRR  AL (IV SOLUTION) ×8
SOL .9 NS 3000ML IRR AL (IV SOLUTION) ×4
SOL .9 NS 3000ML IRR UROMATIC (IV SOLUTION) ×4 IMPLANT
SYRINGE IRR TOOMEY STRL 70CC (SYRINGE) ×3 IMPLANT
TUBE PUMP MORCELLATOR PIRANHA (TUBING) ×3 IMPLANT
WATER STERILE IRR 1000ML POUR (IV SOLUTION) ×3 IMPLANT
WATER STERILE IRR 3000ML UROMA (IV SOLUTION) ×2 IMPLANT

## 2020-02-18 NOTE — Transfer of Care (Signed)
Immediate Anesthesia Transfer of Care Note  Patient: Donald Foster [facility]  Procedure(s) Performed: HOLEP-LASER ENUCLEATION OF THE PROSTATE WITH MORCELLATION (N/A Prostate)  Patient Location: PACU  Anesthesia Type:General  Level of Consciousness: drowsy  Airway & Oxygen Therapy: Patient Spontanous Breathing and Patient connected to face mask oxygen  Post-op Assessment: Report given to RN and Post -op Vital signs reviewed and stable  Post vital signs: Reviewed and stable  Last Vitals:  Vitals Value Taken Time  BP 151/62 02/18/20 0929  Temp    Pulse 76 02/18/20 0930  Resp 18 02/18/20 0930  SpO2 100 % 02/18/20 0930  Vitals shown include unvalidated device data.  Last Pain:  Vitals:   02/18/20 0648  PainSc: 0-No pain      Patients Stated Pain Goal: 0 (78/41/28 2081)  Complications: No complications documented.

## 2020-02-18 NOTE — Op Note (Signed)
Date of procedure: 02/18/20  Preoperative diagnosis:  1. BPH with BOO 2. Urinary retention  Postoperative diagnosis:  1. same   Procedure: 1. HoLEP with morcellation  Surgeon: Hollice Espy, MD  Anesthesia: General  Complications: None  Intraoperative findings: Elevated bladder neck with median lobe.  Mild catheter cystitis.  Heavily trabeculated bladder.  EBL: Minimal  Specimens: Prostate chips  Drains: 4 French two-way Foley catheter with 45 cc in a 30 cc balloon  Indication: Donald Foster is a 74 y.o. patient with BPH with outlet obstruction who developed severe acute urinary retention is failed multiple voiding trials.  After reviewing the management options for treatment, he elected to proceed with the above surgical procedure(s). We have discussed the potential benefits and risks of the procedure, side effects of the proposed treatment, the likelihood of the patient achieving the goals of the procedure, and any potential problems that might occur during the procedure or recuperation. Informed consent has been obtained.  Description of procedure:  The patient was taken to the operating room and general anesthesia was induced.  The patient was placed in the dorsal lithotomy position, prepped and draped in the usual sterile fashion, and preoperative antibiotics were administered. A preoperative time-out was performed.     A 26 French resectoscope sheath using a blunt angled obturator was introduced without difficulty into the bladder.  The bladder was carefully inspected and noted to be moderately trabeculated.  There is an elevated bladder neck with medium well-circumscribed median lobe.  On the posterior bladder wall, there is a mild amount of catheter cystitis as well as on the prostatic mucosa at the bladder neck.  The bladder was moderately to severely trabeculated The trigone was able to be visualized with some manipulation and the UOs were good distance bladder neck  itself.  The prostatic fossa had significant trilobar coaptation with greater than 5 cm prostatic length.  A 550 m laser fiber was then brought in and using settings of 0.9 J's and 53 Hz, 2 incisions were created at the 5:00 and 7:00 positions of the bladder neck on either side of the median lobe down to the level of the bladder neck/capsular fibers.  The incision was carried down caudally meeting in the midline just above the verumontanum.  The median lobe was then enucleated from a caudal to cranial direction cleaving the adenoma off the underlying capsule rolling it towards the bladder neck and ultimately cleaving the mucosa to free the median lobe into the bladder.   Next, a semilunar incision was created at the prostatic apex on the left side again freeing up the adenoma from the underlying capsule.  Care was taken to avoid any resection past the verumontanum.  This incision was carried around laterally and cranially towards the bladder neck.  Ultimately, I was able to complete the anterior commissure mucosa and the adenoma into the bladder creating a widely patent prostatic fossa.     Next, the same similar incision was created at the right prostatic apex.  This adenoma however ended up being enucleated and more of a piece wise fashion freeing up a large BPH nodules from the capsular fibers.  Once this was completed and cleared from the bladder neck, the prostatic fossa was noted to be widely patent.  Hemostasis was achieved using hemostatic fiber settings.  Bilateral UOs were visualized and free of any injury.  Finally, the 36 French resectoscope was exchanged for nephroscope and using the Piranha handpiece morcellator, the bladder was distended in  each of the prostate chips were evacuated.  A tiny mucosal abrasion was noted on the left lateral bladder wall which was superficial.  Bugbee electrocautery was used to fulgurate this area for hemostasis. The bladder was irrigated several times and flushed  nicely.    No residual prostate tissue was noted within the bladder. Hemostasis was adequate.  10 mg of IV Lasix was administered to help with postoperative diuresis.  A 22 French two-way Foley catheter was then inserted over a catheter guide with 30 cc in the balloon.  The balloon was overfilled with 45 cc of water The catheter irrigated easily and well.  Patient was then clean and dry, repositioned supine position, reversed from anesthesia, taken to PACU in stable condition.   Plan: Patient will return to the office in 2 days for voiding trial.      Hollice Espy, M.D.

## 2020-02-18 NOTE — Anesthesia Postprocedure Evaluation (Signed)
Anesthesia Post Note  Patient: Donald Foster  Procedure(s) Performed: HOLEP-LASER ENUCLEATION OF THE PROSTATE WITH MORCELLATION (N/A Prostate)  Patient location during evaluation: PACU Anesthesia Type: General Level of consciousness: awake and alert and oriented Pain management: pain level controlled Vital Signs Assessment: post-procedure vital signs reviewed and stable Respiratory status: spontaneous breathing, nonlabored ventilation and respiratory function stable Cardiovascular status: blood pressure returned to baseline and stable Postop Assessment: no signs of nausea or vomiting Anesthetic complications: no   No complications documented.   Last Vitals:  Vitals:   02/18/20 1023 02/18/20 1029  BP:  (!) 146/85  Pulse: 77 77  Resp: 12 16  Temp: (!) 36.2 C (!) 35.8 C  SpO2: 98% 96%    Last Pain:  Vitals:   02/18/20 1029  TempSrc: Temporal  PainSc: 0-No pain                 Catie Chiao

## 2020-02-18 NOTE — Interval H&P Note (Signed)
H&P up-to-date, no changes  Regular rate and rhythm Clear to auscultation bilaterally  Urine culture reviewed, appropriately treated.  All questions answered

## 2020-02-18 NOTE — Discharge Instructions (Signed)
AMBULATORY SURGERY  DISCHARGE INSTRUCTIONS   1) The drugs that you were given will stay in your system until tomorrow so for the next 24 hours you should not:  A) Drive an automobile B) Make any legal decisions C) Drink any alcoholic beverage   2) You may resume regular meals tomorrow.  Today it is better to start with liquids and gradually work up to solid foods.  You may eat anything you prefer, but it is better to start with liquids, then soup and crackers, and gradually work up to solid foods.   3) Please notify your doctor immediately if you have any unusual bleeding, trouble breathing, redness and pain at the surgery site, drainage, fever, or pain not relieved by medication.    4) Additional Instructions:        Please contact your physician with any problems or Same Day Surgery at [phone], Monday through Friday 6 am to 4 pm, or  at [facility] number at [phone].    Indwelling Urinary Catheter Care, Adult An indwelling urinary catheter is a thin tube that is put into your bladder. The tube helps to drain pee (urine) out of your body. The tube goes in through your urethra. Your urethra is where pee comes out of your body. Your pee will come out through the catheter, then it will go into a bag (drainage bag). Take good care of your catheter so it will work well. How to wear your catheter and bag Supplies needed  Sticky tape (adhesive tape) or a leg strap.  Alcohol wipe or soap and water (if you use tape).  A clean towel (if you use tape).  Large overnight bag.  Smaller bag (leg bag). Wearing your catheter Attach your catheter to your leg with tape or a leg strap.  Make sure the catheter is not pulled tight.  If a leg strap gets wet, take it off and put on a dry strap.  If you use tape to hold the bag on your leg: 1. Use an alcohol wipe or soap and water to wash your skin where the tape made it sticky before. 2. Use a clean towel to  pat-dry that skin. 3. Use new tape to make the bag stay on your leg. Wearing your bags You should have been given a large overnight bag.  You may wear the overnight bag in the day or night.  Always have the overnight bag lower than your bladder.  Do not let the bag touch the floor.  Before you go to sleep, put a clean plastic bag in a wastebasket. Then hang the overnight bag inside the wastebasket. You should also have a smaller leg bag that fits under your clothes.  Always wear the leg bag below your knee.  Do not wear your leg bag at night. How to care for your skin and catheter Supplies needed  A clean washcloth.  Water and mild soap.  A clean towel. Caring for your skin and catheter      Clean the skin around your catheter every day: 1. Wash your hands with soap and water. 2. Wet a clean washcloth in warm water and mild soap. 3. Clean the skin around your urethra.  If you are male:  Gently spread the folds of skin around your vagina (labia).  With the washcloth in your other hand, wipe the inner side of your labia on each side. Wipe from front to back.  If you are male:  Pull back any   skin that covers the end of your penis (foreskin).  With the washcloth in your other hand, wipe your penis in small circles. Start wiping at the tip of your penis, then move away from the catheter.  Move the foreskin back in place, if needed. 4. With your free hand, hold the catheter close to where it goes into your body.  Keep holding the catheter during cleaning so it does not get pulled out. 5. With the washcloth in your other hand, clean the catheter.  Only wipe downward on the catheter.  Do not wipe upward toward your body. Doing this may push germs into your urethra and cause infection. 6. Use a clean towel to pat-dry the catheter and the skin around it. Make sure to wipe off all soap. 7. Wash your hands with soap and water.  Shower every day. Do not take baths.  Do  not use cream, ointment, or lotion on the area where the catheter goes into your body, unless your doctor tells you to.  Do not use powders, sprays, or lotions on your genital area.  Check your skin around the catheter every day for signs of infection. Check for: ? Redness, swelling, or pain. ? Fluid or blood. ? Warmth. ? Pus or a bad smell. How to empty the bag Supplies needed  Rubbing alcohol.  Gauze pad or cotton ball.  Tape or a leg strap. Emptying the bag Pour the pee out of your bag when it is ?- full, or at least 2-3 times a day. Do this for your overnight bag and your leg bag. 1. Wash your hands with soap and water. 2. Separate (detach) the bag from your leg. 3. Hold the bag over the toilet or a clean pail. Keep the bag lower than your hips and bladder. This is so the pee (urine) does not go back into the tube. 4. Open the pour spout. It is at the bottom of the bag. 5. Empty the pee into the toilet or pail. Do not let the pour spout touch any surface. 6. Put rubbing alcohol on a gauze pad or cotton ball. 7. Use the gauze pad or cotton ball to clean the pour spout. 8. Close the pour spout. 9. Attach the bag to your leg with tape or a leg strap. 10. Wash your hands with soap and water. Follow instructions for cleaning the drainage bag:  From the product maker.  As told by your doctor. How to change the bag Supplies needed  Alcohol wipes.  A clean bag.  Tape or a leg strap. Changing the bag Replace your bag when it starts to leak, smell bad, or look dirty. 1. Wash your hands with soap and water. 2. Separate the dirty bag from your leg. 3. Pinch the catheter with your fingers so that pee does not spill out. 4. Separate the catheter tube from the bag tube where these tubes connect (at the connection valve). Do not let the tubes touch any surface. 5. Clean the end of the catheter tube with an alcohol wipe. Use a different alcohol wipe to clean the end of the bag  tube. 6. Connect the catheter tube to the tube of the clean bag. 7. Attach the clean bag to your leg with tape or a leg strap. Do not make the bag tight on your leg. 8. Wash your hands with soap and water. General rules   Never pull on your catheter. Never try to take it out. Doing that can hurt   you.  Always wash your hands before and after you touch your catheter or bag. Use a mild, fragrance-free soap. If you do not have soap and water, use hand sanitizer.  Always make sure there are no twists or bends (kinks) in the catheter tube.  Always make sure there are no leaks in the catheter or bag.  Drink enough fluid to keep your pee pale yellow.  Do not take baths, swim, or use a hot tub.  If you are male, wipe from front to back after you poop (have a bowel movement). Contact a doctor if:  Your pee is cloudy.  Your pee smells worse than usual.  Your catheter gets clogged.  Your catheter leaks.  Your bladder feels full. Get help right away if:  You have redness, swelling, or pain where the catheter goes into your body.  You have fluid, blood, pus, or a bad smell coming from the area where the catheter goes into your body.  Your skin feels warm where the catheter goes into your body.  You have a fever.  You have pain in your: ? Belly (abdomen). ? Legs. ? Lower back. ? Bladder.  You see blood in the catheter.  Your pee is pink or red.  You feel sick to your stomach (nauseous).  You throw up (vomit).  You have chills.  Your pee is not draining into the bag.  Your catheter gets pulled out. Summary  An indwelling urinary catheter is a thin tube that is placed into the bladder to help drain pee (urine) out of the body.  The catheter is placed into the part of the body that drains pee from the bladder (urethra).  Taking good care of your catheter will keep it working properly and help prevent problems.  Always wash your hands before and after touching your  catheter or bag.  Never pull on your catheter or try to take it out. This information is not intended to replace advice given to you by your health care provider. Make sure you discuss any questions you have with your health care provider. Document Revised: 10/20/2018 Document Reviewed: 02/11/2017 Elsevier Patient Education  2020 [employer]  

## 2020-02-18 NOTE — Anesthesia Procedure Notes (Signed)
Procedure Name: Intubation Date/Time: 02/18/2020 7:50 AM Performed by: Eben Burow, CRNA Pre-anesthesia Checklist: Patient identified, Emergency Drugs available, Suction available and Patient being monitored Patient Re-evaluated:Patient Re-evaluated prior to induction Oxygen Delivery Method: Circle system utilized Preoxygenation: Pre-oxygenation with 100% oxygen Induction Type: IV induction Ventilation: Mask ventilation without difficulty Laryngoscope Size: Miller and 2 Grade View: Grade I Tube type: Oral Tube size: 7.5 mm Number of attempts: 1 Airway Equipment and Method: Stylet Placement Confirmation: ETT inserted through vocal cords under direct vision,  positive ETCO2 and breath sounds checked- equal and bilateral Secured at: 21 cm Tube secured with: Tape Dental Injury: Teeth and Oropharynx as per pre-operative assessment

## 2020-02-18 NOTE — Anesthesia Preprocedure Evaluation (Signed)
Anesthesia Evaluation  Patient identified by MRN, date of birth, ID band Patient awake    Reviewed: Allergy & Precautions, NPO status , Patient's Chart, lab work & pertinent test results  History of Anesthesia Complications Negative for: history of anesthetic complications  Airway Mallampati: II  TM Distance: >3 FB Neck ROM: Full    Dental  (+) Upper Dentures, Lower Dentures   Pulmonary neg sleep apnea, COPD,  COPD inhaler, former smoker,    breath sounds clear to auscultation- rhonchi (-) wheezing      Cardiovascular hypertension, Pt. on medications (-) CAD, (-) Past MI, (-) Cardiac Stents and (-) CABG  Rhythm:Regular Rate:Normal - Systolic murmurs and - Diastolic murmurs    Neuro/Psych neg Seizures negative neurological ROS  negative psych ROS   GI/Hepatic Neg liver ROS, PUD, GERD  Medicated,  Endo/Other  negative endocrine ROSneg diabetes  Renal/GU CRFRenal disease     Musculoskeletal negative musculoskeletal ROS (+)   Abdominal (+) - obese,   Peds  Hematology  (+) anemia ,   Anesthesia Other Findings Past Medical History: 01/14/2020: AKI (acute kidney injury) ([place]) No date: BPH (benign prostatic hyperplasia) No date: COPD (chronic obstructive pulmonary disease) ([facility]) No date: GERD (gastroesophageal reflux disease) No date: Hypercholesteremia No date: Hypertension   Reproductive/Obstetrics                             Anesthesia Physical Anesthesia Plan  ASA: III  Anesthesia Plan: General   Post-op Pain Management:    Induction: Intravenous  PONV Risk Score and Plan: 1 and Ondansetron and Dexamethasone  Airway Management Planned: Oral ETT  Additional Equipment:   Intra-op Plan:   Post-operative Plan: Extubation in OR  Informed Consent: I have reviewed the patients History and Physical, chart, labs and discussed the procedure including the risks, benefits and  alternatives for the proposed anesthesia with the patient or authorized representative who has indicated his/her understanding and acceptance.     Dental advisory given  Plan Discussed with: CRNA and Anesthesiologist  Anesthesia Plan Comments:         Anesthesia Quick Evaluation

## 2020-02-20 ENCOUNTER — Ambulatory Visit: Payer: Medicare HMO | Admitting: Physician Assistant

## 2020-02-20 LAB — SURGICAL PATHOLOGY

## 2020-02-21 ENCOUNTER — Encounter: Payer: Self-pay | Admitting: Physician Assistant

## 2020-03-10 ENCOUNTER — Encounter: Payer: Self-pay | Admitting: Physician Assistant

## 2020-03-10 ENCOUNTER — Ambulatory Visit (INDEPENDENT_AMBULATORY_CARE_PROVIDER_SITE_OTHER): Payer: Medicare HMO | Admitting: Physician Assistant

## 2020-03-10 ENCOUNTER — Other Ambulatory Visit: Payer: Self-pay

## 2020-03-10 ENCOUNTER — Ambulatory Visit: Payer: Medicare HMO | Admitting: Physician Assistant

## 2020-03-10 VITALS — BP 138/83 | HR 93 | Ht 68.0 in | Wt 129.0 lb

## 2020-03-10 DIAGNOSIS — N401 Enlarged prostate with lower urinary tract symptoms: Secondary | ICD-10-CM | POA: Diagnosis not present

## 2020-03-10 DIAGNOSIS — R338 Other retention of urine: Secondary | ICD-10-CM

## 2020-03-10 LAB — BLADDER SCAN AMB NON-IMAGING: Scan Result: 42

## 2020-03-10 MED ORDER — CIPROFLOXACIN HCL 500 MG PO TABS
500.0000 mg | ORAL_TABLET | Freq: Two times a day (BID) | ORAL | 0 refills | Status: AC
Start: 2020-03-10 — End: 2020-03-13

## 2020-03-10 MED ORDER — CIPROFLOXACIN HCL 500 MG PO TABS
500.0000 mg | ORAL_TABLET | Freq: Once | ORAL | Status: AC
Start: 2020-03-10 — End: 2020-03-10
  Administered 2020-03-10: 500 mg via ORAL

## 2020-03-10 MED ORDER — CIPROFLOXACIN HCL 500 MG PO TABS: 500.0000 mg | ORAL_TABLET | Freq: Two times a day (BID) | ORAL | 0 refills | Status: DC

## 2020-03-10 NOTE — Progress Notes (Signed)
Fill and Pull Catheter Removal  Patient is present today for a catheter removal.  Patient was cleaned and prepped in a sterile fashion 136ml of sterile water was instilled into the bladder when the patient felt the urge to urinate. 64ml of water was then drained from the balloon.  A 22FR foley cath was removed from the bladder no complications were noted .  Patient was then given some time to void on their own.  Patient can void  165ml on their own after some time.  Patient tolerated well.  Performed by: Debroah Loop, PA-C   Follow up/ Additional notes: Patient no-showed his original scheduled voiding trial on POD3. As 3 weeks has elapsed since surgery, administering prophylactic antibiotics with Foley removal. 500mg  Cipro administered prior to Foley pull and Cipro 500mg  x5 doses sent to his pharmacy. Urine sample obtained from Foley bag and sent for culture. Shared negative surgical pathology results with patient. Counseled him to push fluids and RTC this afternoon for repeat PVR.

## 2020-03-10 NOTE — Progress Notes (Signed)
Afternoon follow-up  Patient returned to clinic this afternoon for repeat PVR. He has been able to urinate. PVR 53mL.  Results for orders placed or performed in visit on 03/10/20  BLADDER SCAN AMB NON-IMAGING  Result Value Ref Range   Scan Result 42      Voiding trial passed. Patient left without being seen.

## 2020-03-15 LAB — CULTURE, URINE COMPREHENSIVE

## 2020-03-18 ENCOUNTER — Telehealth: Payer: Self-pay | Admitting: Physician Assistant

## 2020-03-18 NOTE — Telephone Encounter (Signed)
Please contact the patient and inquire if he is having any dysuria, urgency, frequency, lower abdominal pain, lower back pain, fever, chills, nausea, or vomiting. If he is having infective symptoms, I would like to prescribe an additional course of antibiotics per his recent urine culture. If he is asymptomatic, then no treatment indicated for asymptomatic bacteriuria in the setting of prolonged Foley catheterization following HOLEP.

## 2020-03-18 NOTE — Telephone Encounter (Signed)
LMOM for patient to return call to inquire ab symptoms.

## 2020-03-20 NOTE — Telephone Encounter (Signed)
Attempted to contact patient again. LMOM with sister per DPR to inquire about patients current symptoms.

## 2020-03-31 NOTE — Progress Notes (Signed)
04/01/2020 11:12 AM   Donald Foster 06-14-1946 [id number]  Referring provider: Elisabeth Cara, [place] [address] [place],  [place] [address] Chief Complaint  Patient presents with  . Benign Prostatic Hypertrophy    6wk post HOLEP    HPI: Donald Foster is a 74 y.o. male who returns for follow up 6 weeks s/p HoLEP 02/18/2020.   Patient was recently admitted earlier this month for acute urinary retention with acute renal failure requiring Foley placement with development of gross hematuria and postobstructive diuresis following bladder decompression.  Patient underwent HoLEP on 02/18/2020. Elevated bladder neck with median lobe.  Mild catheter cystitis.  Heavily trabeculated bladder. Pathology revealed benign prostatic glandular and stromal hyperplasia.   Patient had a catheter removal on 03/10/2020 with Debroah Loop, PA-C.  PVR 27 mL. He reports that he is voiding well. He has a good stream. He has burning with urination. He denies accidents. He has nocturia x 1.   Patient has a history of elevated residuals.   IPSS    Row Name 04/01/20 1100         International Prostate Symptom Score   How often have you had the sensation of not emptying your bladder? Less than half the time     How often have you had to urinate less than every two hours? About half the time     How often have you found you stopped and started again several times when you urinated? More than half the time     How often have you found it difficult to postpone urination? Not at All     How often have you had a weak urinary stream? Almost always     How often have you had to strain to start urination? Not at All     How many times did you typically get up at night to urinate? 3 Times     Total IPSS Score 17       Quality of Life due to urinary symptoms   If you were to spend the rest of your life with your urinary condition just the way it is now how would you feel about that? Terrible              Score:  1-7 Mild 8-19 Moderate 20-35 Severe   PMH: Past Medical History:  Diagnosis Date  . AKI (acute kidney injury) ([facility]) 01/14/2020  . BPH (benign prostatic hyperplasia)   . COPD (chronic obstructive pulmonary disease) ([facility])   . GERD (gastroesophageal reflux disease)   . Hypercholesteremia   . Hypertension     Surgical History: Past Surgical History:  Procedure Laterality Date  . CHOLECYSTECTOMY    . COLONOSCOPY WITH PROPOFOL N/A 08/17/2018   Procedure: COLONOSCOPY WITH PROPOFOL;  Surgeon: Lin Landsman, MD;  Location: [facility] ENDOSCOPY;  Service: Gastroenterology;  Laterality: N/A;  . ESOPHAGOGASTRODUODENOSCOPY (EGD) WITH PROPOFOL N/A 08/01/2018   Procedure: ESOPHAGOGASTRODUODENOSCOPY (EGD) WITH PROPOFOL;  Surgeon: Lin Landsman, MD;  Location: [facility] ENDOSCOPY;  Service: Gastroenterology;  Laterality: N/A;  . HEMORRHOID SURGERY N/A 02/22/2019   Procedure: HEMORRHOIDECTOMY;  Surgeon: Jules Husbands, MD;  Location: [facility] ORS;  Service: General;  Laterality: N/A;  . HOLEP-LASER ENUCLEATION OF THE PROSTATE WITH MORCELLATION N/A 02/18/2020   Procedure: HOLEP-LASER ENUCLEATION OF THE PROSTATE WITH MORCELLATION;  Surgeon: Hollice Espy, MD;  Location: [facility] ORS;  Service: Urology;  Laterality: N/A;  . UPPER GI ENDOSCOPY  08/17/2018   Procedure: UPPER GI ENDOSCOPY;  Surgeon: Marius Ditch,  Tally Due, MD;  Location: [facility] ENDOSCOPY;  Service: Gastroenterology;;    Home Medications:  Allergies as of 04/01/2020   No Known Allergies     Medication List       Accurate as of April 01, 2020 11:12 AM. If you have any questions, ask your nurse or doctor.        STOP taking these medications   HYDROcodone-acetaminophen 5-325 MG tablet Commonly known as: NORCO/VICODIN Stopped by: Hollice Espy, MD   oxybutynin 5 MG tablet Commonly known as: DITROPAN Stopped by: Hollice Espy, MD     TAKE these medications   albuterol 108 (90 Base) MCG/ACT inhaler Commonly known as:  VENTOLIN HFA Inhale 2 puffs into the lungs every 4 (four) hours as needed for wheezing or shortness of breath. Every 4-6 hrs prn   aluminum hydroxide-magnesium carbonate 95-358 MG/15ML Susp Commonly known as: Gaviscon Take 15 mLs by mouth 4 (four) times daily - after meals and at bedtime.   amLODipine 10 MG tablet Commonly known as: NORVASC Take 10 mg by mouth daily.   atorvastatin 20 MG tablet Commonly known as: LIPITOR Take 20 mg by mouth at bedtime.   Fluticasone-Salmeterol 100-50 MCG/DOSE Aepb Commonly known as: ADVAIR Inhale 1 puff into the lungs 2 (two) times daily.   losartan 50 MG tablet Commonly known as: COZAAR Hold until outpatient doctor followup, due to acute kidney injury.   omeprazole 40 MG capsule Commonly known as: PRILOSEC Take 1 capsule (40 mg total) by mouth 2 (two) times daily before a meal.   Spiriva HandiHaler 18 MCG inhalation capsule Generic drug: tiotropium Place 18 mcg into inhaler and inhale daily.   tamsulosin 0.4 MG Caps capsule Commonly known as: FLOMAX Take 0.4 mg by mouth daily.       Allergies: No Known Allergies  Family History: Family History  Problem Relation Age of Onset  . Cancer Father     Social History:  reports that he quit smoking about 20 months ago. His smoking use included cigarettes. He started smoking about 2 years ago. He has a 42.75 pack-year smoking history. He has never used smokeless tobacco. He reports previous alcohol use. He reports previous drug use.   Physical Exam: BP (!) 146/80   Pulse 87   Ht 5\' 8"  (1.727 m)   Wt 135 lb (61.2 kg)   BMI 20.53 kg/m   Constitutional:  Alert and oriented, No acute distress. HEENT: [place] AT, moist mucus membranes.  Trachea midline, no masses. Cardiovascular: No clubbing, cyanosis, or edema. Respiratory: Normal respiratory effort, no increased work of breathing. Skin: No rashes, bruises or suspicious lesions. Neurologic: Grossly intact, no focal deficits, moving all 4  extremities. Psychiatric: Normal mood and affect.  Laboratory Data:  Lab Results  Component Value Date   CREATININE 3.16 (H) 01/19/2020    Urinalysis Urinalysis today with greater than 30 white blood cells, 3-10 red blood cells, nitrate negative, few bacteria.  Pertinent Imaging: Results for orders placed or performed in visit on 04/01/20  BLADDER SCAN AMB NON-IMAGING  Result Value Ref Range   Scan Result 69ml      Assessment & Plan:    1. BPH with urinary retention Significant urinary improvement s/p Urolift on 02/18/2020. No incontinence. Discontinue Flomax.  IPSS score: 17, moderate. PVR 27 mL.  2. Dysuria Mild dysuria is likely a result of recent procedure and will resolve over time. RTC is symptoms do not improve. Encouraged to push fluids.  UA today to rule out infection-urine appears  to be consistent with postoperative changes rather than true infection but will send culture to ensure, treat as needed  RTC in 6 months with PA for IPSS/PVR/ [place] [address], [place], [place] [address] [phone]  I, Selena Batten, am acting as a scribe for Dr. Hollice Espy.  I have reviewed the above documentation for accuracy and completeness, and I agree with the above.   Hollice Espy, MD

## 2020-04-01 ENCOUNTER — Encounter: Payer: Self-pay | Admitting: Urology

## 2020-04-01 ENCOUNTER — Ambulatory Visit (INDEPENDENT_AMBULATORY_CARE_PROVIDER_SITE_OTHER): Payer: Medicare HMO | Admitting: Urology

## 2020-04-01 ENCOUNTER — Other Ambulatory Visit: Payer: Self-pay

## 2020-04-01 VITALS — BP 146/80 | HR 87 | Ht 68.0 in | Wt 135.0 lb

## 2020-04-01 DIAGNOSIS — R338 Other retention of urine: Secondary | ICD-10-CM

## 2020-04-01 DIAGNOSIS — N401 Enlarged prostate with lower urinary tract symptoms: Secondary | ICD-10-CM

## 2020-04-01 LAB — MICROSCOPIC EXAMINATION: WBC, UA: >30 /hpf — AB (ref 0–5)

## 2020-04-01 LAB — URINALYSIS, COMPLETE
Bilirubin, UA: NEGATIVE
Glucose, UA: NEGATIVE
Ketones, UA: NEGATIVE
Nitrite, UA: NEGATIVE
Specific Gravity, UA: 1.015 (ref 1.005–1.030)
Urobilinogen, Ur: 0.2 mg/dL (ref 0.2–1.0)
pH, UA: 6 (ref 5.0–7.5)

## 2020-04-01 LAB — BLADDER SCAN AMB NON-IMAGING

## 2020-04-06 LAB — CULTURE, URINE COMPREHENSIVE

## 2020-04-07 ENCOUNTER — Telehealth: Payer: Self-pay | Admitting: *Deleted

## 2020-04-07 NOTE — Telephone Encounter (Signed)
Left message for patient to notify them that it is time to schedule annual low dose lung cancer screening CT scan. Instructed patient to call back to verify information prior to the scan being scheduled.  

## 2020-04-26 ENCOUNTER — Encounter: Payer: Self-pay | Admitting: Emergency Medicine

## 2020-04-26 ENCOUNTER — Emergency Department: Payer: Medicare HMO

## 2020-04-26 ENCOUNTER — Other Ambulatory Visit: Payer: Self-pay

## 2020-04-26 ENCOUNTER — Emergency Department
Admission: EM | Admit: 2020-04-26 | Discharge: 2020-04-26 | Disposition: A | Discharge status: Home / Self Care | Payer: Medicare HMO | Attending: Student in an Organized Health Care Education/Training Program | Admitting: Student in an Organized Health Care Education/Training Program

## 2020-04-26 DIAGNOSIS — I10 Essential (primary) hypertension: Secondary | ICD-10-CM | POA: Insufficient documentation

## 2020-04-26 DIAGNOSIS — Z79899 Other long term (current) drug therapy: Secondary | ICD-10-CM | POA: Insufficient documentation

## 2020-04-26 DIAGNOSIS — R531 Weakness: Secondary | ICD-10-CM | POA: Diagnosis not present

## 2020-04-26 DIAGNOSIS — J449 Chronic obstructive pulmonary disease, unspecified: Secondary | ICD-10-CM | POA: Insufficient documentation

## 2020-04-26 DIAGNOSIS — R42 Dizziness and giddiness: Secondary | ICD-10-CM

## 2020-04-26 DIAGNOSIS — Z87891 Personal history of nicotine dependence: Secondary | ICD-10-CM | POA: Diagnosis not present

## 2020-04-26 LAB — HEPATIC FUNCTION PANEL
ALT: 10 U/L (ref 0–44)
AST: 15 U/L (ref 15–41)
Albumin: 3.7 g/dL (ref 3.5–5.0)
Alkaline Phosphatase: 59 U/L (ref 38–126)
Bilirubin, Direct: <0.1 mg/dL (ref 0.0–0.2)
Total Bilirubin: 0.6 mg/dL (ref 0.3–1.2)
Total Protein: 6.9 g/dL (ref 6.5–8.1)

## 2020-04-26 LAB — CBC
HCT: 36.3 % — ABNORMAL LOW (ref 39.0–52.0)
Hemoglobin: 11.9 g/dL — ABNORMAL LOW (ref 13.0–17.0)
MCH: 25.1 pg — ABNORMAL LOW (ref 26.0–34.0)
MCHC: 32.8 g/dL (ref 30.0–36.0)
MCV: 76.4 fL — ABNORMAL LOW (ref 80.0–100.0)
Platelets: 191 10*3/uL (ref 150–400)
RBC: 4.75 MIL/uL (ref 4.22–5.81)
RDW: 17.3 % — ABNORMAL HIGH (ref 11.5–15.5)
WBC: 4.1 10*3/uL (ref 4.0–10.5)
nRBC: 0 % (ref 0.0–0.2)

## 2020-04-26 LAB — BASIC METABOLIC PANEL
Anion gap: 9 (ref 5–15)
BUN: 20 mg/dL (ref 8–23)
CO2: 22 mmol/L (ref 22–32)
Calcium: 8.9 mg/dL (ref 8.9–10.3)
Chloride: 109 mmol/L (ref 98–111)
Creatinine, Ser: 1.82 mg/dL — ABNORMAL HIGH (ref 0.61–1.24)
GFR, Estimated: 36 mL/min — ABNORMAL LOW (ref >60)
Glucose, Bld: 98 mg/dL (ref 70–99)
Potassium: 3.7 mmol/L (ref 3.5–5.1)
Sodium: 140 mmol/L (ref 135–145)

## 2020-04-26 LAB — TROPONIN I (HIGH SENSITIVITY): Troponin I (High Sensitivity): 13 ng/L (ref <18)

## 2020-04-26 MED ORDER — SODIUM CHLORIDE 0.9 % IV BOLUS
1000.0000 mL | Freq: Once | INTRAVENOUS | Status: AC
  Administered 2020-04-26: 1000 mL via INTRAVENOUS

## 2020-04-26 NOTE — ED Provider Notes (Signed)
[facility] Emergency Department Provider Note    First MD Initiated Contact with Patient 04/26/20 [phone]     (approximate)  I have reviewed the triage vital signs and the nursing notes.   HISTORY  Chief Complaint Dizziness and Weakness    HPI Donald Foster is a 74 y.o. male presents to the ER for evaluation of lightheadedness and near syncopal episode is been occurring multiple times in the past week or 2.  Denies any history of the same.  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  Denies any numbness or tingling.  No weakness.  Is never had symptoms like this before.  Denies any sudden onset or thunderclap headache.  Does have a history of COPD but denies any significant worsening shortness of breath.  Did recently make change to his blood pressure medications concerned that might be related to his symptoms.    Past Medical History:  Diagnosis Date  . AKI (acute kidney injury) ([place]) 01/14/2020  . BPH (benign prostatic hyperplasia)   . COPD (chronic obstructive pulmonary disease) ([place])   . GERD (gastroesophageal reflux disease)   . Hypercholesteremia   . Hypertension    Family History  Problem Relation Age of Onset  . Cancer Father    Past Surgical History:  Procedure Laterality Date  . CHOLECYSTECTOMY    . COLONOSCOPY WITH PROPOFOL N/A 08/17/2018   Procedure: COLONOSCOPY WITH PROPOFOL;  Surgeon: Lin Landsman, MD;  Location: [facility] ENDOSCOPY;  Service: Gastroenterology;  Laterality: N/A;  . ESOPHAGOGASTRODUODENOSCOPY (EGD) WITH PROPOFOL N/A 08/01/2018   Procedure: ESOPHAGOGASTRODUODENOSCOPY (EGD) WITH PROPOFOL;  Surgeon: Lin Landsman, MD;  Location: [facility] ENDOSCOPY;  Service: Gastroenterology;  Laterality: N/A;  . HEMORRHOID SURGERY N/A 02/22/2019   Procedure: HEMORRHOIDECTOMY;   Surgeon: Jules Husbands, MD;  Location: [facility] ORS;  Service: General;  Laterality: N/A;  . HOLEP-LASER ENUCLEATION OF THE PROSTATE WITH MORCELLATION N/A 02/18/2020   Procedure: HOLEP-LASER ENUCLEATION OF THE PROSTATE WITH MORCELLATION;  Surgeon: Hollice Espy, MD;  Location: [facility] ORS;  Service: Urology;  Laterality: N/A;  . UPPER GI ENDOSCOPY  08/17/2018   Procedure: UPPER GI ENDOSCOPY;  Surgeon: Lin Landsman, MD;  Location: [facility] ENDOSCOPY;  Service: Gastroenterology;;   Patient Active Problem List   Diagnosis Date Noted  . AKI (acute kidney injury) ([place]) 01/14/2020  . Acute cystitis with hematuria 01/14/2020  . COPD with chronic bronchitis ([place]) 01/14/2020  . Personal history of tobacco use, presenting hazards to health 04/17/2019  . Iron deficiency anemia   . Duodenal ulcer   . Anal lesion   . BPH (benign prostatic hyperplasia) 08/12/2014      Prior to Admission medications   Medication Sig Start Date End Date Taking? Authorizing Provider  albuterol (VENTOLIN HFA) 108 (90 Base) MCG/ACT inhaler Inhale 2 puffs into the lungs every 4 (four) hours as needed for wheezing or shortness of breath. Every 4-6 hrs prn    [provider]  aluminum hydroxide-magnesium carbonate (GAVISCON) 95-358 MG/15ML SUSP Take 15 mLs by mouth 4 (four) times daily - after meals and at bedtime. 05/19/18   [name], MD  amLODipine (NORVASC) 10 MG tablet Take 10 mg by mouth daily.  05/03/18   [provider]  atorvastatin (LIPITOR) 20 MG tablet Take 20 mg by mouth at bedtime.  05/03/18  [provider]  Fluticasone-Salmeterol (ADVAIR) 100-50 MCG/DOSE AEPB Inhale 1 puff into the lungs 2 (two) times daily.    [provider]  losartan (COZAAR) 50 MG tablet Hold until outpatient doctor followup, due to acute kidney injury. 01/19/20   Enzo Bi, MD  omeprazole (PRILOSEC) 40 MG capsule Take 1 capsule (40 mg total) by mouth 2 (two) times daily before a meal. 08/17/18 01/14/20   Vanga, Tally Due, MD  SPIRIVA HANDIHALER 18 MCG inhalation capsule Place 18 mcg into inhaler and inhale daily.  05/05/18   [provider]    Allergies Patient has no known allergies.    Social History Social History   Tobacco Use  . Smoking status: Former Smoker    Packs/day: 0.75    Years: 57.00    Pack years: 42.75    Types: Cigarettes    Start date: 03/12/2018    Quit date: 2020    Years since quitting: 1.7  . Smokeless tobacco: Never Used  Vaping Use  . Vaping Use: Former  Substance Use Topics  . Alcohol use: Not Currently  . Drug use: Not Currently    Review of Systems Patient denies headaches, rhinorrhea, blurry vision, numbness, shortness of breath, chest pain, edema, cough, abdominal pain, nausea, vomiting, diarrhea, dysuria, fevers, rashes or hallucinations unless otherwise stated above in HPI. ____________________________________________   PHYSICAL EXAM:  VITAL SIGNS: Vitals:   04/26/20 0755  BP: (!) 161/82  Pulse: 82  Resp: 15  SpO2: 97%    Constitutional: Alert and oriented.  Eyes: Conjunctivae are normal.  Head: Atraumatic. Nose: No congestion/rhinnorhea. Mouth/Throat: Mucous membranes are moist.   Neck: No stridor. Painless ROM.  Cardiovascular: Normal rate, regular rhythm. Grossly normal heart sounds.  Good peripheral circulation. Respiratory: Normal respiratory effort.  No retractions. Lungs CTAB. Gastrointestinal: Soft and nontender. No distention. No abdominal bruits. No CVA tenderness. Genitourinary:  Musculoskeletal: No lower extremity tenderness nor edema.  No joint effusions. Neurologic:  CN- intact.  No facial droop, Normal FNF.  Normal heel to shin.  Sensation intact bilaterally. Normal speech and language. No gross focal neurologic deficits are appreciated. No gait instability. Skin:  Skin is warm, dry and intact. No rash noted. Psychiatric: Mood and affect are normal. Speech and behavior are  normal.  ____________________________________________   LABS (all labs ordered are listed, but only abnormal results are displayed)  No results found for this or any previous visit (from the past 24 hour(s)). ____________________________________________  EKG My review and personal interpretation at Time: 7:45   Indication: dizziness  Rate: 85  Rhythm: sinus Axis: normal Other: normal intervals, no stemi ____________________________________________  RADIOLOGY  I personally reviewed all radiographic images ordered to evaluate for the above acute complaints and reviewed radiology reports and findings.  These findings were personally discussed with the patient.  Please see medical record for radiology report.  ____________________________________________   PROCEDURES  Procedure(s) performed:  Procedures    Critical Care performed: no ____________________________________________   INITIAL IMPRESSION / ASSESSMENT AND PLAN / ED COURSE  Pertinent labs & imaging results that were available during my care of the patient were reviewed by me and considered in my medical decision making (see chart for details).   DDX: Dehydration, electrolyte abnormality, anemia, CVA, mass, dysrhythmia, CHF doubt IPH or SAH,   TIMMY BUBECK is a 74 y.o. who presents to the ED with presentation as described above.  Patient clinically is fairly well-appearing in no acute distress.  Exam is reassuring.  No focal  neuro deficits.  Does not have any nuchal rigidity.  No thunderclap or sudden onset headache.  No specific environmental triggers.  Denies any chest pain or pressure.  No palpitations.  Possible component of dehydration.  Order blood work observe in the ER on monitor.  Telemetry shows sinus rhythm no ectopy.  He is not complaining of any melena or hematochezia.  Renal function is actually improved as compared to previous.  Clinical Course as of Apr 26 942  Sat Apr 26, 2020  0907 Patient  reassessed.  Remains well-appearing and asymptomatic at this time.  Has not taken his blood pressure medication on further discussion with his current medication regimen appears the patient has been taking to losartan pills daily although he is prescribed only 1.  This was a recent mixup in given duration of symptoms and presentation I suspect he is becoming hypotensive secondary to overmedicating with the losartan.   [PR]    Clinical Course User Index [PR] Merlyn Lot, MD    The patient was evaluated in Emergency Department today for the symptoms described in the history of present illness. He/she was evaluated in the context of the global COVID-19 pandemic, which necessitated consideration that the patient might be at risk for infection with the SARS-CoV-2 virus that causes COVID-19. Institutional protocols and algorithms that pertain to the evaluation of patients at risk for COVID-19 are in a state of rapid change based on information released by regulatory bodies including the CDC and federal and state organizations. These policies and algorithms were followed during the patient's care in the ED.  As part of my medical decision making, I reviewed the following data within the [place] notes reviewed and incorporated, Labs reviewed, notes from prior ED visits and [place] Controlled Substance Database   ____________________________________________   FINAL CLINICAL IMPRESSION(S) / ED DIAGNOSES  Final diagnoses:  Dizziness      NEW MEDICATIONS STARTED DURING THIS VISIT:  New Prescriptions   No medications on file     Note:  This document was prepared using Dragon voice recognition software and may include unintentional dictation errors.    Merlyn Lot, MD 04/26/20 [phone]

## 2020-04-26 NOTE — Discharge Instructions (Addendum)
Please take only one Losartan (50mg ) pill daily.   I suspect your episodes of dizziness are related to taking too much of your blood pressure medication.  Blood work is otherwise reassuring.  Please follow-up with your primary care physician.  Return to the ER if you have any additional questions concerns or new symptoms.

## 2020-04-26 NOTE — ED Notes (Signed)
X-ray at bedside

## 2020-04-26 NOTE — ED Triage Notes (Signed)
Pt reports for the last week he has had intermittent episodes of feeling weak, dizzy and like he was going to pass out. Pt states will also have pounding in his head

## 2020-04-26 NOTE — ED Notes (Signed)
Pt taken for CT 

## 2020-04-26 NOTE — ED Notes (Signed)
Pt given urinal and made aware of need for urine sample. 

## 2020-05-15 ENCOUNTER — Telehealth: Payer: Self-pay | Admitting: *Deleted

## 2020-05-15 NOTE — Telephone Encounter (Signed)
Attempted to contact and schedule lung screening scan. Message left for patient to call back to schedule. 

## 2020-06-03 ENCOUNTER — Telehealth: Payer: Self-pay | Admitting: *Deleted

## 2020-06-03 NOTE — Telephone Encounter (Signed)
Attempted to contact and schedule lung screening scan. Message left for patient to call back to schedule. 

## 2020-06-19 ENCOUNTER — Telehealth: Payer: Self-pay | Admitting: *Deleted

## 2020-06-19 NOTE — Telephone Encounter (Signed)
Attempted to contact and schedule lung screening scan. Message left for patient to call back to schedule. 

## 2020-09-11 ENCOUNTER — Telehealth: Payer: Self-pay | Admitting: *Deleted

## 2020-09-11 NOTE — Telephone Encounter (Signed)
Attempted to reach patient for scheduling of lung screening. I left a vm x 2 for patient (home line and cell #). I also spoke with patient's sister, Hassan Rowan.  I explained to Hassan Rowan that we have been trying to reach her brother to arrange for ct lung screening. I requested that she have her brother return our phone call.

## 2020-09-16 ENCOUNTER — Telehealth: Payer: Self-pay | Admitting: *Deleted

## 2020-09-16 NOTE — Telephone Encounter (Signed)
Attempted to contact and schedule lung screening scan. Message left for patient to call back to schedule. 

## 2020-09-29 ENCOUNTER — Other Ambulatory Visit: Payer: Self-pay

## 2020-09-29 DIAGNOSIS — R338 Other retention of urine: Secondary | ICD-10-CM

## 2020-09-29 DIAGNOSIS — N401 Enlarged prostate with lower urinary tract symptoms: Secondary | ICD-10-CM

## 2020-09-30 ENCOUNTER — Encounter: Payer: Self-pay | Admitting: Physician Assistant

## 2020-09-30 ENCOUNTER — Ambulatory Visit (INDEPENDENT_AMBULATORY_CARE_PROVIDER_SITE_OTHER): Payer: Medicare HMO | Admitting: Physician Assistant

## 2020-09-30 ENCOUNTER — Other Ambulatory Visit: Payer: Self-pay

## 2020-09-30 DIAGNOSIS — N401 Enlarged prostate with lower urinary tract symptoms: Secondary | ICD-10-CM

## 2020-09-30 DIAGNOSIS — R338 Other retention of urine: Secondary | ICD-10-CM | POA: Diagnosis not present

## 2020-09-30 LAB — BLADDER SCAN AMB NON-IMAGING

## 2020-09-30 NOTE — Progress Notes (Signed)
09/30/2020 4:15 PM   Donald Foster 02-17-1946 [id number]  CC: Chief Complaint  Patient presents with  . Benign Prostatic Hypertrophy    HPI: Donald Foster is a 75 y.o. male with PMH BPH with urinary retention s/p HOLEP with Dr. Erlene Quan on 02/18/2020 who presents today for 63-month follow-up and symptom recheck.  Today he reports no bothersome urinary symptoms.  He states he is overall very pleased with his progress since surgery.  He denies urinary leakage or dysuria.  He is no longer taking Flomax.  IPSS 4/pleased as below.  PVR 21 mL.     IPSS    Row Name 09/30/20 1000         International Prostate Symptom Score   How often have you had the sensation of not emptying your bladder? Not at All     How often have you had to urinate less than every two hours? Not at All     How often have you found you stopped and started again several times when you urinated? Less than half the time     How often have you found it difficult to postpone urination? Not at All     How often have you had a weak urinary stream? Not at All     How often have you had to strain to start urination? Not at All     How many times did you typically get up at night to urinate? 2 Times     Total IPSS Score 4           Quality of Life due to urinary symptoms   If you were to spend the rest of your life with your urinary condition just the way it is now how would you feel about that? Pleased             PMH: Past Medical History:  Diagnosis Date  . AKI (acute kidney injury) ([place]) 01/14/2020  . BPH (benign prostatic hyperplasia)   . COPD (chronic obstructive pulmonary disease) ([place])   . GERD (gastroesophageal reflux disease)   . Hypercholesteremia   . Hypertension     Surgical History: Past Surgical History:  Procedure Laterality Date  . CHOLECYSTECTOMY    . COLONOSCOPY WITH PROPOFOL N/A 08/17/2018   Procedure: COLONOSCOPY WITH PROPOFOL;  Surgeon: Lin Landsman, MD;  Location: [facility]  ENDOSCOPY;  Service: Gastroenterology;  Laterality: N/A;  . ESOPHAGOGASTRODUODENOSCOPY (EGD) WITH PROPOFOL N/A 08/01/2018   Procedure: ESOPHAGOGASTRODUODENOSCOPY (EGD) WITH PROPOFOL;  Surgeon: Lin Landsman, MD;  Location: [facility] ENDOSCOPY;  Service: Gastroenterology;  Laterality: N/A;  . HEMORRHOID SURGERY N/A 02/22/2019   Procedure: HEMORRHOIDECTOMY;  Surgeon: Jules Husbands, MD;  Location: [facility] ORS;  Service: General;  Laterality: N/A;  . HOLEP-LASER ENUCLEATION OF THE PROSTATE WITH MORCELLATION N/A 02/18/2020   Procedure: HOLEP-LASER ENUCLEATION OF THE PROSTATE WITH MORCELLATION;  Surgeon: Hollice Espy, MD;  Location: [facility] ORS;  Service: Urology;  Laterality: N/A;  . UPPER GI ENDOSCOPY  08/17/2018   Procedure: UPPER GI ENDOSCOPY;  Surgeon: Lin Landsman, MD;  Location: [facility] ENDOSCOPY;  Service: Gastroenterology;;    Home Medications:  Allergies as of 09/30/2020   No Known Allergies     Medication List       Accurate as of September 30, 2020  4:15 PM. If you have any questions, ask your nurse or doctor.        albuterol 108 (90 Base) MCG/ACT inhaler Commonly known as: VENTOLIN HFA Inhale 2 puffs into  the lungs every 4 (four) hours as needed for wheezing or shortness of breath. Every 4-6 hrs prn   aluminum hydroxide-magnesium carbonate 95-358 MG/15ML Susp Commonly known as: Gaviscon Take 15 mLs by mouth 4 (four) times daily - after meals and at bedtime.   amLODipine 10 MG tablet Commonly known as: NORVASC Take 10 mg by mouth daily.   atorvastatin 20 MG tablet Commonly known as: LIPITOR Take 20 mg by mouth at bedtime.   Fluticasone-Salmeterol 100-50 MCG/DOSE Aepb Commonly known as: ADVAIR Inhale 1 puff into the lungs 2 (two) times daily.   losartan 50 MG tablet Commonly known as: COZAAR Hold until outpatient doctor followup, due to acute kidney injury.   omeprazole 40 MG capsule Commonly known as: PRILOSEC Take 1 capsule (40 mg total) by mouth 2 (two) times daily  before a meal.   Spiriva HandiHaler 18 MCG inhalation capsule Generic drug: tiotropium Place 18 mcg into inhaler and inhale daily.       Allergies:  No Known Allergies  Family History: Family History  Problem Relation Age of Onset  . Cancer Father     Social History:   reports that he quit smoking about 2 years ago. His smoking use included cigarettes. He started smoking about 2 years ago. He has a 42.75 pack-year smoking history. He has never used smokeless tobacco. He reports previous alcohol use. He reports previous drug use.  Physical Exam: BP (!) 149/77   Pulse 91   Ht 5\' 6"  (1.676 m)   Wt 139 lb (63 kg)   BMI 22.44 kg/m   Constitutional:  Alert and oriented, no acute distress, nontoxic appearing HEENT: [place], AT Cardiovascular: No clubbing, cyanosis, or edema Respiratory: Normal respiratory effort, no increased work of breathing Skin: No rashes, bruises or suspicious lesions Neurologic: Grossly intact, no focal deficits, moving all 4 extremities Psychiatric: Normal mood and affect  Laboratory Data: Results for orders placed or performed in visit on 09/30/20  Bladder Scan (Post Void Residual) in office  Result Value Ref Range   Scan Result 21mL    Assessment & Plan:   1. Benign prostatic hyperplasia with urinary retention LUTS resolved and PVR WNL following HOLEP with no residual urinary leakage. No longer requiring urologic agents.  Obtained PSA today and will call with results. If WNL, recommend discontinuing routine PSA monitoring due to patient's age and negative HOLEP pathology results. He expressed understanding. - PSA - Bladder Scan (Post Void Residual) in office  Return if symptoms worsen or fail to improve, for Will call with results.  Debroah Loop, PA-C  [facility] [address] [place], [place] [address] [phone]

## 2020-10-01 ENCOUNTER — Telehealth: Payer: Self-pay

## 2020-10-01 LAB — PSA: Prostate Specific Ag, Serum: 1.8 ng/mL (ref 0.0–4.0)

## 2020-10-01 NOTE — Telephone Encounter (Signed)
-----   Message from Debroah Loop, [place] sent at 10/01/2020 12:43 PM EDT ----- I attempted to contact the patient's sister Hassan Rowan to report his PSA results but the line disconnected. Please reattempt to call her at [phone].  PSA decreased following HOLEP as expected, still within normal limits. This is great news, nothing further to do. He can follow-up as needed. ----- Message ----- From: Lavone Neri Lab Results In Sent: 10/01/2020   5:38 AM EDT To: Debroah Loop, PA-C

## 2020-10-01 NOTE — Telephone Encounter (Signed)
OK per DPR, LMOM of sister Elby Showers for patient.

## 2021-06-22 ENCOUNTER — Encounter: Payer: Self-pay | Admitting: Surgery

## 2021-06-22 ENCOUNTER — Other Ambulatory Visit: Payer: Self-pay

## 2021-06-22 ENCOUNTER — Ambulatory Visit: Payer: Medicare HMO | Admitting: Surgery

## 2021-06-29 ENCOUNTER — Telehealth: Payer: Self-pay | Admitting: Gastroenterology

## 2021-06-29 NOTE — Telephone Encounter (Signed)
Inbound call from pt requesting to schedule his appt for his 1 yr recall for a colonoscopy.

## 2021-07-01 ENCOUNTER — Other Ambulatory Visit: Payer: Self-pay

## 2021-07-01 DIAGNOSIS — Z1211 Encounter for screening for malignant neoplasm of colon: Secondary | ICD-10-CM

## 2021-07-01 MED ORDER — CLENPIQ 10-3.5-12 MG-GM -GM/160ML PO SOLN: 1.0000 | ORAL | 0 refills | Status: DC

## 2021-07-01 NOTE — Progress Notes (Signed)
Gastroenterology Pre-Procedure Review  Request Date: 08/04/2021 Requesting Physician: Dr. Marius Ditch   PATIENT REVIEW QUESTIONS: The patient responded to the following health history questions as indicated:    1. Are you having any GI issues? no 2. Do you have a personal history of Polyps? yes (last colonoscopy) 3. Do you have a family history of Colon Cancer or Polyps? yes (cancer) 4. Diabetes Mellitus? no 5. Joint replacements in the past 12 months?no 6. Major health problems in the past 3 months?no 7. Any artificial heart valves, MVP, or defibrillator?no    MEDICATIONS & ALLERGIES:    Patient reports the following regarding taking any anticoagulation/antiplatelet therapy:   Plavix, Coumadin, Eliquis, Xarelto, Lovenox, Pradaxa, Brilinta, or Effient? no Aspirin? no  Patient confirms/reports the following medications:  Current Outpatient Medications  Medication Sig Dispense Refill   albuterol (VENTOLIN HFA) 108 (90 Base) MCG/ACT inhaler Inhale 2 puffs into the lungs every 4 (four) hours as needed for wheezing or shortness of breath. Every 4-6 hrs prn     aluminum hydroxide-magnesium carbonate (GAVISCON) 95-358 MG/15ML SUSP Take 15 mLs by mouth 4 (four) times daily - after meals and at bedtime. 1 Bottle 2   amLODipine (NORVASC) 10 MG tablet Take 10 mg by mouth daily.      atorvastatin (LIPITOR) 20 MG tablet Take 20 mg by mouth at bedtime.      Fluticasone-Salmeterol (ADVAIR) 100-50 MCG/DOSE AEPB Inhale 1 puff into the lungs 2 (two) times daily.     losartan (COZAAR) 50 MG tablet Hold until outpatient doctor followup, due to acute kidney injury.     omeprazole (PRILOSEC) 40 MG capsule Take 1 capsule (40 mg total) by mouth 2 (two) times daily before a meal. 180 capsule 0   SPIRIVA HANDIHALER 18 MCG inhalation capsule Place 18 mcg into inhaler and inhale daily.      SYMBICORT 160-4.5 MCG/ACT inhaler Inhale into the lungs.     No current facility-administered medications for this visit.     Patient confirms/reports the following allergies:  No Known Allergies  No orders of the defined types were placed in this encounter.   AUTHORIZATION INFORMATION Primary Insurance: 1D#: Group #:  Secondary Insurance: 1D#: Group #:  SCHEDULE INFORMATION: Date: 08/04/2021 Time: Location: [facility]

## 2021-08-03 ENCOUNTER — Encounter: Payer: Self-pay | Admitting: Gastroenterology

## 2021-08-04 ENCOUNTER — Ambulatory Visit
Admission: RE | Admit: 2021-08-04 | Discharge: 2021-08-04 | Disposition: A | Discharge status: Home / Self Care | Payer: Medicare HMO | Attending: Gastroenterology | Admitting: Gastroenterology

## 2021-08-04 ENCOUNTER — Encounter
Admission: RE | Disposition: A | Discharge status: Home / Self Care | Payer: Self-pay | Source: Home / Self Care | Attending: Gastroenterology

## 2021-08-04 ENCOUNTER — Ambulatory Visit: Payer: Medicare HMO | Admitting: Certified Registered"

## 2021-08-04 ENCOUNTER — Encounter: Payer: Self-pay | Admitting: Gastroenterology

## 2021-08-04 DIAGNOSIS — Z09 Encounter for follow-up examination after completed treatment for conditions other than malignant neoplasm: Secondary | ICD-10-CM | POA: Insufficient documentation

## 2021-08-04 DIAGNOSIS — Z1211 Encounter for screening for malignant neoplasm of colon: Secondary | ICD-10-CM

## 2021-08-04 DIAGNOSIS — Z87891 Personal history of nicotine dependence: Secondary | ICD-10-CM | POA: Diagnosis not present

## 2021-08-04 DIAGNOSIS — D124 Benign neoplasm of descending colon: Secondary | ICD-10-CM | POA: Insufficient documentation

## 2021-08-04 DIAGNOSIS — Z79899 Other long term (current) drug therapy: Secondary | ICD-10-CM | POA: Diagnosis not present

## 2021-08-04 DIAGNOSIS — E7800 Pure hypercholesterolemia, unspecified: Secondary | ICD-10-CM | POA: Diagnosis not present

## 2021-08-04 DIAGNOSIS — K644 Residual hemorrhoidal skin tags: Secondary | ICD-10-CM | POA: Insufficient documentation

## 2021-08-04 DIAGNOSIS — J449 Chronic obstructive pulmonary disease, unspecified: Secondary | ICD-10-CM | POA: Diagnosis not present

## 2021-08-04 DIAGNOSIS — I10 Essential (primary) hypertension: Secondary | ICD-10-CM | POA: Insufficient documentation

## 2021-08-04 DIAGNOSIS — Z86010 Personal history of colonic polyps: Secondary | ICD-10-CM | POA: Diagnosis not present

## 2021-08-04 DIAGNOSIS — Z7951 Long term (current) use of inhaled steroids: Secondary | ICD-10-CM | POA: Insufficient documentation

## 2021-08-04 DIAGNOSIS — K5730 Diverticulosis of large intestine without perforation or abscess without bleeding: Secondary | ICD-10-CM | POA: Diagnosis not present

## 2021-08-04 DIAGNOSIS — N400 Enlarged prostate without lower urinary tract symptoms: Secondary | ICD-10-CM | POA: Insufficient documentation

## 2021-08-04 DIAGNOSIS — K219 Gastro-esophageal reflux disease without esophagitis: Secondary | ICD-10-CM | POA: Diagnosis not present

## 2021-08-04 DIAGNOSIS — K635 Polyp of colon: Secondary | ICD-10-CM | POA: Diagnosis not present

## 2021-08-04 DIAGNOSIS — D122 Benign neoplasm of ascending colon: Secondary | ICD-10-CM | POA: Insufficient documentation

## 2021-08-04 DIAGNOSIS — D120 Benign neoplasm of cecum: Secondary | ICD-10-CM | POA: Diagnosis not present

## 2021-08-04 HISTORY — PX: COLONOSCOPY WITH PROPOFOL: SHX5780

## 2021-08-04 SURGERY — COLONOSCOPY WITH PROPOFOL
Anesthesia: General

## 2021-08-04 MED ORDER — LIDOCAINE HCL (PF) 2 % IJ SOLN
INTRAMUSCULAR | Status: AC
  Filled 2021-08-04: qty 5

## 2021-08-04 MED ORDER — PROPOFOL 500 MG/50ML IV EMUL
INTRAVENOUS | Status: DC | PRN
  Administered 2021-08-04: 150 ug/kg/min via INTRAVENOUS

## 2021-08-04 MED ORDER — LIDOCAINE HCL (CARDIAC) PF 100 MG/5ML IV SOSY
PREFILLED_SYRINGE | INTRAVENOUS | Status: DC | PRN
  Administered 2021-08-04: 50 mg via INTRAVENOUS

## 2021-08-04 MED ORDER — PROPOFOL 10 MG/ML IV BOLUS
INTRAVENOUS | Status: DC | PRN
Start: 2021-08-04 — End: 2021-08-04
  Administered 2021-08-04: 40 mg via INTRAVENOUS

## 2021-08-04 MED ORDER — PROPOFOL 500 MG/50ML IV EMUL
INTRAVENOUS | Status: AC
  Filled 2021-08-04: qty 50

## 2021-08-04 MED ORDER — SODIUM CHLORIDE 0.9 % IV SOLN: INTRAVENOUS | Status: DC

## 2021-08-04 NOTE — H&P (Signed)
[name], MD [address]  [place]  [place], [place] [address]  Main: [phone]  Fax: [phone] Pager: [phone]  Primary Care Physician:  Elisabeth Cara, NP Primary Gastroenterologist:  Dr. [name]  Pre-Procedure History & Physical: HPI:  Donald Foster is a 76 y.o. male is here for an colonoscopy.   Past Medical History:  Diagnosis Date   AKI (acute kidney injury) ([place]) 01/14/2020   BPH (benign prostatic hyperplasia)    COPD (chronic obstructive pulmonary disease) (H[facility])    GERD (gastroesophageal reflux disease)    Hypercholesteremia    Hypertension     Past Surgical History:  Procedure Laterality Date   CHOLECYSTECTOMY     COLONOSCOPY WITH PROPOFOL N/A 08/17/2018   Procedure: COLONOSCOPY WITH PROPOFOL;  Surgeon: Lin Landsman, MD;  Location: [facility];  Service: Gastroenterology;  Laterality: N/A;   ESOPHAGOGASTRODUODENOSCOPY (EGD) WITH PROPOFOL N/A 08/01/2018   Procedure: ESOPHAGOGASTRODUODENOSCOPY (EGD) WITH PROPOFOL;  Surgeon: Lin Landsman, MD;  Location: [facility];  Service: Gastroenterology;  Laterality: N/A;   HEMORRHOID SURGERY N/A 02/22/2019   Procedure: HEMORRHOIDECTOMY;  Surgeon: Jules Husbands, MD;  Location: [facility];  Service: General;  Laterality: N/A;   HOLEP-LASER ENUCLEATION OF THE PROSTATE WITH MORCELLATION N/A 02/18/2020   Procedure: HOLEP-LASER ENUCLEATION OF THE PROSTATE WITH MORCELLATION;  Surgeon: Hollice Espy, MD;  Location: [facility];  Service: Urology;  Laterality: N/A;   UPPER GI ENDOSCOPY  08/17/2018   Procedure: UPPER GI ENDOSCOPY;  Surgeon: Lin Landsman, MD;  Location: [facility];  Service: Gastroenterology;;    Prior to Admission medications   Medication Sig Start Date End Date Taking? Authorizing Provider  albuterol (VENTOLIN HFA) 108 (90 Base) MCG/ACT inhaler Inhale 2 puffs into the lungs every 4 (four) hours as needed for wheezing or shortness of breath. Every 4-6 hrs prn     [provider]  aluminum hydroxide-magnesium carbonate (GAVISCON) 95-358 MG/15ML SUSP Take 15 mLs by mouth 4 (four) times daily - after meals and at bedtime. 05/19/18   [name], MD  amLODipine (NORVASC) 10 MG tablet Take 10 mg by mouth daily.  05/03/18   [provider]  atorvastatin (LIPITOR) 20 MG tablet Take 20 mg by mouth at bedtime.  05/03/18   [provider]  Fluticasone-Salmeterol (ADVAIR) 100-50 MCG/DOSE AEPB Inhale 1 puff into the lungs 2 (two) times daily.    [provider]  losartan (COZAAR) 50 MG tablet Hold until outpatient doctor followup, due to acute kidney injury. 01/19/20   Enzo Bi, MD  omeprazole (PRILOSEC) 40 MG capsule Take 1 capsule (40 mg total) by mouth 2 (two) times daily before a meal. 08/17/18 07/01/21  Wanda Cellucci, Tally Due, MD  Sod Picosulfate-Mag Ox-Cit Acd (CLENPIQ) 10-3.5-12 MG-GM -GM/160ML SOLN Take 1 kit by mouth as directed. At 5 PM evening before procedure, drink 1 bottle of Clenpiq, hydrate, drink (5) 8 oz of water. Then do the same thing 5 hours prior to your procedure. 07/01/21   Lin Landsman, MD  SPIRIVA HANDIHALER 18 MCG inhalation capsule Place 18 mcg into inhaler and inhale daily.  05/05/18   [provider]  SYMBICORT 160-4.5 MCG/ACT inhaler Inhale into the lungs. 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 work  Tobacco Use   Smoking status: Former    Packs/day: 0.75    Years: 57.00    Pack years: 42.75    Types: Cigarettes    Start date: 03/12/2018    Quit date: 2020    Years since  quitting: 3.0   Smokeless tobacco: Never  Vaping Use   Vaping Use: Former  Substance and Sexual Activity   Alcohol use: Yes    Comment: occasional   Drug use: Not Currently   Sexual activity: Not on file  Other Topics Concern   Not on file  Social History Narrative   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 Neck:  Supple; no masses or thyromegaly. Lungs:  Clear throughout to auscultation.    Heart:  Regular rate and rhythm. Abdomen:  Soft, nontender and nondistended. Normal bowel sounds, without guarding, and without rebound.   Neurologic:  Alert and  oriented x4;  grossly normal neurologically.  Impression/Plan: Donald Foster is here for an colonoscopy to be performed for h/o colon adenomas  Risks, benefits, limitations, and alternatives regarding  colonoscopy have been reviewed with the patient.  Questions have been answered.  All parties agreeable.   Sherri Sear, MD  08/04/2021, 11:19 AM

## 2021-08-04 NOTE — Anesthesia Preprocedure Evaluation (Signed)
Anesthesia Evaluation  Patient identified by MRN, date of birth, ID band Patient awake    Reviewed: Allergy & Precautions, NPO status , Patient's Chart, lab work & pertinent test results  History of Anesthesia Complications Negative for: history of anesthetic complications  Airway Mallampati: II  TM Distance: >3 FB Neck ROM: Full    Dental  (+) Upper Dentures, Lower Dentures   Pulmonary neg sleep apnea, COPD,  COPD inhaler, former smoker,    breath sounds clear to auscultation- rhonchi (-) wheezing      Cardiovascular hypertension, Pt. on medications (-) CAD, (-) Past MI, (-) Cardiac Stents and (-) CABG  Rhythm:Regular Rate:Normal - Systolic murmurs and - Diastolic murmurs    Neuro/Psych neg Seizures negative neurological ROS  negative psych ROS   GI/Hepatic Neg liver ROS, PUD, GERD  Medicated,  Endo/Other  negative endocrine ROSneg diabetes  Renal/GU CRFRenal disease     Musculoskeletal negative musculoskeletal ROS (+)   Abdominal (+) - obese,   Peds  Hematology  (+) anemia ,   Anesthesia Other Findings Past Medical History: 01/14/2020: AKI (acute kidney injury) ([place]) No date: BPH (benign prostatic hyperplasia) No date: COPD (chronic obstructive pulmonary disease) (H[facility]) No date: GERD (gastroesophageal reflux disease) No date: Hypercholesteremia No date: Hypertension   Reproductive/Obstetrics                             Anesthesia Physical  Anesthesia Plan  ASA: 3  Anesthesia Plan: General   Post-op Pain Management: Minimal or no pain anticipated   Induction: Intravenous  PONV Risk Score and Plan: 2 and Ondansetron and Dexamethasone  Airway Management Planned: Natural Airway  Additional Equipment: None  Intra-op Plan:   Post-operative Plan:   Informed Consent: I have reviewed the patients History and Physical, chart, labs and discussed the procedure including the risks,  benefits and alternatives for the proposed anesthesia with the patient or authorized representative who has indicated his/her understanding and acceptance.     Dental advisory given  Plan Discussed with: CRNA and Anesthesiologist  Anesthesia Plan Comments: (Discussed risks of anesthesia with patient, including possibility of difficulty with spontaneous ventilation under anesthesia necessitating airway intervention, PONV, and rare risks such as cardiac or respiratory or neurological events, and allergic reactions. Discussed the role of CRNA in patient's perioperative care. Patient understands.)        Anesthesia Quick Evaluation

## 2021-08-04 NOTE — Anesthesia Procedure Notes (Signed)
Procedure Name: MAC Date/Time: 08/04/2021 11:23 AM Performed by: Jerrye Noble, CRNA Pre-anesthesia Checklist: Patient identified, Emergency Drugs available, Suction available and Patient being monitored Patient Re-evaluated:Patient Re-evaluated prior to induction Oxygen Delivery Method: Nasal cannula

## 2021-08-04 NOTE — Transfer of Care (Signed)
Immediate Anesthesia Transfer of Care Note  Patient: Donald Foster  Procedure(s) Performed: COLONOSCOPY WITH PROPOFOL  Patient Location: PACU and Endoscopy Unit  Anesthesia Type:General  Level of Consciousness: drowsy and patient cooperative  Airway & Oxygen Therapy: Patient Spontanous Breathing  Post-op Assessment: Report given to RN and Post -op Vital signs reviewed and stable  Post vital signs: Reviewed and stable  Last Vitals:  Vitals Value Taken Time  BP 126/68 08/04/21 1150  Temp    Pulse 71   Resp 18   SpO2 96     Last Pain:  Vitals:   08/04/21 1041  TempSrc: Temporal  PainSc: 0-No pain         Complications: No notable events documented.

## 2021-08-04 NOTE — Op Note (Signed)
[facility] Patient Name: Donald Foster Procedure Date: 08/04/2021 11:12 AM MRN: [id number] Account #: [id number] Date of Birth: [birth year] Admit Type: Outpatient Age: 76 Room: [facility] ENDO ROOM 3 Gender: Male Note Status: Finalized Instrument Name: Park Meo [id number] Procedure:             Colonoscopy Indications:           Surveillance: Personal history of adenomatous polyps                         on last colonoscopy 3 years ago, Last colonoscopy:                         February 2020 Providers:             Lin Landsman MD, MD Referring MD:          Elisabeth Cara NP Medicines:             General Anesthesia Complications:         No immediate complications. Estimated blood loss: None. Procedure:             Pre-Anesthesia Assessment:                        - Prior to the procedure, a History and Physical was                         performed, and patient medications and allergies were                         reviewed. The patient is competent. The risks and                         benefits of the procedure and the sedation options and                         risks were discussed with the patient. All questions                         were answered and informed consent was obtained.                         Patient identification and proposed procedure were                         verified by the physician, the nurse, the                         anesthesiologist, the anesthetist and the technician                         in the pre-procedure area in the procedure room in the                         endoscopy suite. Mental Status Examination: alert and                         oriented. Airway Examination: normal oropharyngeal  airway and neck mobility. Respiratory Examination:                         clear to auscultation. CV Examination: normal.                         Prophylactic Antibiotics: The patient does not  require                         prophylactic antibiotics. Prior Anticoagulants: The                         patient has taken no previous anticoagulant or                         antiplatelet agents. ASA Grade Assessment: II - A                         patient with mild systemic disease. After reviewing                         the risks and benefits, the patient was deemed in                         satisfactory condition to undergo the procedure. The                         anesthesia plan was to use general anesthesia.                         Immediately prior to administration of medications,                         the patient was re-assessed for adequacy to receive                         sedatives. The heart rate, respiratory rate, oxygen                         saturations, blood pressure, adequacy of pulmonary                         ventilation, and response to care were monitored                         throughout the procedure. The physical status of the                         patient was re-assessed after the procedure.                        After obtaining informed consent, the colonoscope was                         passed under direct vision. Throughout the procedure,                         the patient's blood pressure, pulse, and oxygen  saturations were monitored continuously. The                         Colonoscope was introduced through the anus and                         advanced to the the cecum, identified by appendiceal                         orifice and ileocecal valve. The colonoscopy was                         performed without difficulty. The patient tolerated                         the procedure well. The quality of the bowel                         preparation was evaluated using the BBPS [facility] Bowel                         Preparation Scale) with scores of: Right Colon = 3,                         Transverse Colon = 3 and Left  Colon = 3 (entire mucosa                         seen well with no residual staining, small fragments                         of stool or opaque liquid). The total BBPS score                         equals 9. Findings:      The perianal and digital rectal examinations were normal. Pertinent       negatives include normal sphincter tone and no palpable rectal lesions.      A diminutive polyp was found in the cecum. The polyp was sessile. The       polyp was removed with a cold biopsy forceps. Resection and retrieval       were complete.      Five sessile polyps were found in the descending colon and ascending       colon. The polyps were 3 to 5 mm in size. These polyps were removed with       a cold snare. Resection and retrieval were complete. Estimated blood       loss: none.      Multiple small and large-mouthed diverticula were found in the       recto-sigmoid colon and sigmoid colon.      Non-bleeding external hemorrhoids were found during retroflexion. The       hemorrhoids were medium-sized.      A 10 mm well healed post surgical scar from removal of anal warts was       found in the distal rectum. The scar tissue was healthy in appearance. Impression:            - One diminutive polyp in the cecum, removed with a  cold biopsy forceps. Resected and retrieved.                        - Five 3 to 5 mm polyps in the descending colon and in                         the ascending colon, removed with a cold snare.                         Resected and retrieved.                        - Diverticulosis in the recto-sigmoid colon and in the                         sigmoid colon.                        - Non-bleeding external hemorrhoids. Recommendation:        - Repeat colonoscopy in 3 - 5 years for surveillance                         of multiple polyps.                        - Discharge patient to home (with escort).                        - Resume previous diet  today.                        - Continue present medications.                        - Await pathology results.                        - Refer to a genetics counselor at appointment to be                         scheduled. Procedure Code(s):     --- Professional ---                        [id number], Colonoscopy, flexible; with removal of                         tumor(s), polyp(s), or other lesion(s) by snare                         technique                        45380, 44, Colonoscopy, flexible; with biopsy, single                         or multiple Diagnosis Code(s):     --- Professional ---                        K63.5, Polyp of colon  Z86.010, Personal history of colonic polyps                        K64.4, Residual hemorrhoidal skin tags                        K57.30, Diverticulosis of large intestine without                         perforation or abscess without bleeding CPT copyright 2019 American Medical Association. All rights reserved. The codes documented in this report are preliminary and upon coder review may  be revised to meet current compliance requirements. Dr. Ulyess Mort Lin Landsman MD, MD 08/04/2021 11:51:03 AM This report has been signed electronically. Number of Addenda: 0 Note Initiated On: 08/04/2021 11:12 AM Scope Withdrawal Time: 0 hours 13 minutes 8 seconds  Total Procedure Duration: 0 hours 17 minutes 50 seconds  Estimated Blood Loss:  Estimated blood loss: none. Estimated blood loss: none.      [facility]

## 2021-08-04 NOTE — Anesthesia Postprocedure Evaluation (Signed)
Anesthesia Post Note  Patient: Donald Foster [facility]  Procedure(s) Performed: COLONOSCOPY WITH PROPOFOL  Patient location during evaluation: Endoscopy Anesthesia Type: General Level of consciousness: awake and alert Pain management: pain level controlled Vital Signs Assessment: post-procedure vital signs reviewed and stable Respiratory status: spontaneous breathing, nonlabored ventilation, respiratory function stable and patient connected to nasal cannula oxygen Cardiovascular status: blood pressure returned to baseline and stable Postop Assessment: no apparent nausea or vomiting Anesthetic complications: no  No notable events documented.   Last Vitals:  Vitals:   08/04/21 1200 08/04/21 1201  BP:    Pulse:    Resp:    Temp: (!) 35.1 C (!) 35.6 C  SpO2:      Last Pain:  Vitals:   08/04/21 1216  TempSrc:   PainSc: 0-No pain                 Arita Miss

## 2021-08-05 ENCOUNTER — Telehealth: Payer: Self-pay

## 2021-08-05 ENCOUNTER — Encounter: Payer: Self-pay | Admitting: Gastroenterology

## 2021-08-05 DIAGNOSIS — Z86010 Personal history of colonic polyps: Secondary | ICD-10-CM

## 2021-08-05 LAB — SURGICAL PATHOLOGY

## 2021-08-05 NOTE — Telephone Encounter (Signed)
Tried to call patient but voicemail is not set up  

## 2021-08-05 NOTE — Telephone Encounter (Signed)
-----   Message from Lin Landsman, MD sent at 08/05/2021 10:31 AM EST ----- Please inform patient about the multiple polyps that were removed were precancerous and he had 10 polyps that were removed in the past were precancerous also.  Therefore, I recommend referral to genetics to evaluate for any hereditary colon cancer syndromes if patient is agreeable  Rohini Vanga

## 2021-08-06 NOTE — Telephone Encounter (Signed)
Patient called back and he states that he is okay with the referral to genetics. Place referral. He states that if we need him to please call his sister because he is at work every day and can not answer his phone. States his sister takes all his messages

## 2021-08-27 ENCOUNTER — Inpatient Hospital Stay: Payer: Medicare HMO | Admitting: Genetic Counselor

## 2021-09-03 ENCOUNTER — Inpatient Hospital Stay: Payer: Medicare HMO

## 2021-09-03 ENCOUNTER — Other Ambulatory Visit: Payer: Self-pay

## 2021-09-03 ENCOUNTER — Encounter: Payer: Self-pay | Admitting: Licensed Clinical Social Worker

## 2021-09-03 ENCOUNTER — Inpatient Hospital Stay: Payer: Medicare HMO | Attending: Genetic Counselor | Admitting: Licensed Clinical Social Worker

## 2021-09-03 DIAGNOSIS — Z809 Family history of malignant neoplasm, unspecified: Secondary | ICD-10-CM

## 2021-09-03 DIAGNOSIS — Z860100 Personal history of colon polyps, unspecified: Secondary | ICD-10-CM | POA: Insufficient documentation

## 2021-09-03 NOTE — Progress Notes (Signed)
REFERRING PROVIDER: Lin Landsman, MD [address] [place],  [place] [address]  PRIMARY PROVIDER:  Elisabeth Cara, NP  PRIMARY REASON FOR VISIT:  1. Personal history of colonic polyps   2. Family history of cancer      HISTORY OF PRESENT ILLNESS:   Donald Foster, a 76 y.o. male, was seen for a [place] cancer genetics consultation at the request of Dr. Marius Ditch due to a personal history of colon polyps.  Donald Foster presents to clinic today to discuss the possibility of a hereditary predisposition to cancer, genetic testing, and to further clarify his future cancer risks, as well as potential cancer risks for family members.   Donald Foster is a 76 y.o. male with no personal history of cancer.  He had a colonoscopy in 2020 that revealed 10 tubular adenomas, and a colonoscopy in 2023 that revealed 6 tubular adenomas for a cumulative 16 tubular adenomas.   CANCER HISTORY:  Oncology History   No history exists.    Past Medical History:  Diagnosis Date   AKI (acute kidney injury) ([place]) 01/14/2020   BPH (benign prostatic hyperplasia)    COPD (chronic obstructive pulmonary disease) (HCC)    Family history of cancer    GERD (gastroesophageal reflux disease)    Hypercholesteremia    Hypertension    Personal history of colonic polyps     Past Surgical History:  Procedure Laterality Date   CHOLECYSTECTOMY     COLONOSCOPY WITH PROPOFOL N/A 08/17/2018   Procedure: COLONOSCOPY WITH PROPOFOL;  Surgeon: Lin Landsman, MD;  Location: [facility] ENDOSCOPY;  Service: Gastroenterology;  Laterality: N/A;   COLONOSCOPY WITH PROPOFOL N/A 08/04/2021   Procedure: COLONOSCOPY WITH PROPOFOL;  Surgeon: Lin Landsman, MD;  Location: [facility] ENDOSCOPY;  Service: Gastroenterology;  Laterality: N/A;   ESOPHAGOGASTRODUODENOSCOPY (EGD) WITH PROPOFOL N/A 08/01/2018   Procedure: ESOPHAGOGASTRODUODENOSCOPY (EGD) WITH PROPOFOL;  Surgeon: Lin Landsman, MD;  Location: [place];  Service:  Gastroenterology;  Laterality: N/A;   HEMORRHOID SURGERY N/A 02/22/2019   Procedure: HEMORRHOIDECTOMY;  Surgeon: Jules Husbands, MD;  Location: [facility] ORS;  Service: General;  Laterality: N/A;   HOLEP-LASER ENUCLEATION OF THE PROSTATE WITH MORCELLATION N/A 02/18/2020   Procedure: HOLEP-LASER ENUCLEATION OF THE PROSTATE WITH MORCELLATION;  Surgeon: Hollice Espy, MD;  Location: [facility] ORS;  Service: Urology;  Laterality: N/A;   UPPER GI ENDOSCOPY  08/17/2018   Procedure: UPPER GI ENDOSCOPY;  Surgeon: Lin Landsman, MD;  Location: [facility] ENDOSCOPY;  Service: Gastroenterology;;    Social History   Socioeconomic History   Marital status: Single    Spouse name: sister, Donald Foster   Number of children: 3   Years of education: Not on file   Highest education level: Not on file  Occupational History   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    Comment: came out of retirement to work  Tobacco Use   Smoking status: Former    Packs/day: 0.75    Years: 57.00    Pack years: 42.75    Types: Cigarettes    Start date: 03/12/2018    Quit date: 2020    Years since quitting: 3.1   Smokeless tobacco: Never  Vaping Use   Vaping Use: Former  Substance and Sexual Activity   Alcohol use: Yes    Comment: occasional   Drug use: Not Currently   Sexual activity: Not on file  Other Topics Concern   Not on file  Social History Narrative   Lives with sister, Donald Foster   Social  Determinants of Health   Financial Resource Strain: Not on file  Food Insecurity: Not on file  Transportation Needs: Not on file  Physical Activity: Not on file  Stress: Not on file  Social Connections: Not on file     FAMILY HISTORY:  We obtained a detailed, 4-generation family history.  Significant diagnoses are listed below: Family History  Problem Relation Age of Onset   Cancer Father    Donald Foster has 1 son, 2, no cancer. He is unsure if he's had a colonoscopy. He has 1 granddaughter. He also had 3 brothers and 4 sisters, none  have had cancer and he is unsure about polyp history for them.  Donald Foster mother died at 43. Patient had 4 maternal uncles, 3 aunts. No known cancers for them or for cousins or grandparents on this side of the family.  Donald Foster father died at 7 of cancer, unsure type. Patient had 1 paternal uncle and 3 paternal aunts. His uncle also had cancer, possibly throat cancer and history of smoking. No other known cancers on this side of the family.  Donald Foster is unaware of previous family history of genetic testing for hereditary cancer risks.  There is no reported Ashkenazi Jewish ancestry. There is no known consanguinity.    GENETIC COUNSELING ASSESSMENT: Donald Foster is a 76 y.o. male with a personal history of colon polyps which is somewhat suggestive of a hereditary polyposis/ cancer syndrome and predisposition to cancer. We, therefore, discussed and recommended the following at today's visit.   DISCUSSION: We discussed that polyps in general are common, however, most people have fewer than 5 lifetime polyps.  When an individual has 10 or more polyps we become concerned about an underlying polyposis syndrome.  The most common hereditary polyposis syndromes are caused by problems in the APC and MUTYH genes. We discussed that testing is beneficial for several reasons including knowing how to follow individuals for cancer screenings, and understand if other family members could be at risk for cancer and allow them to undergo genetic testing.   We reviewed the characteristics, features and inheritance patterns of hereditary cancer syndromes. We also discussed genetic testing, including the appropriate family members to test, the process of testing, insurance coverage and turn-around-time for results. We discussed the implications of a negative, positive and/or variant of uncertain significant result. We recommended Donald Foster pursue genetic testing for the [facility] Multi-Cancer+RNA gene panel.    The Multi-Cancer Panel + RNA offered by [facility] includes sequencing and/or deletion duplication testing of the following 84 genes: AIP, ALK, APC, ATM, AXIN2,BAP1,  BARD1, BLM, BMPR1A, BRCA1, BRCA2, BRIP1, CASR, CDC73, CDH1, CDK4, CDKN1B, CDKN1C, CDKN2A (p14ARF), CDKN2A (p16INK4a), CEBPA, CHEK2, CTNNA1, DICER1, DIS3L2, EGFR (c.2369C>T, p.Thr790Met variant only), EPCAM (Deletion/duplication testing only), FH, FLCN, GATA2, GPC3, GREM1 (Promoter region deletion/duplication testing only), HOXB13 (c.251G>A, p.Gly84Glu), HRAS, KIT, MAX, MEN1, MET, MITF (c.952G>A, p.Glu318Lys variant only), MLH1, MSH2, MSH3, MSH6, MUTYH, NBN, NF1, NF2, NTHL1, PALB2, PDGFRA, PHOX2B, PMS2, POLD1, POLE, POT1, PRKAR1A, PTCH1, PTEN, RAD50, RAD51C, RAD51D, RB1, RECQL4, RET, RUNX1, SDHAF2, SDHA (sequence changes only), SDHB, SDHC, SDHD, SMAD4, SMARCA4, SMARCB1, SMARCE1, STK11, SUFU, TERC, TERT, TMEM127, TP53, TSC1, TSC2, VHL, WRN and WT1.  Based on Donald Foster personal history of polyps he meets medical criteria for genetic testing. Despite that he meets criteria, he may still have an out of pocket cost. We discussed that if his out of pocket cost for testing is over $100, the laboratory will call and confirm whether he wants  to proceed with testing.  If the out of pocket cost of testing is less than $100 he will be billed by the genetic testing laboratory.   PLAN: After considering the risks, benefits, and limitations, Donald Foster provided informed consent to pursue genetic testing and the blood sample was sent to [facility] for analysis of the Multi-Cancer+RNA panel. Results should be available within approximately 2-3 weeks' time, at which point they will be disclosed by telephone to Donald Foster, as will any additional recommendations warranted by these results. Donald Foster will receive a summary of his genetic counseling visit and a copy of his results once available. This information will also be available in Epic.   Mr.  Foster questions were answered to his satisfaction today. Our contact information was provided should additional questions or concerns arise. Thank you for the referral and allowing [place] to share in the care of your patient.   Faith Rogue, MS, [facility] Genetic Counselor [email or web address] Phone: [phone]  The patient was seen for a total of 20 minutes in face-to-face genetic counseling.  Patient was seen alone.  Dr. Grayland Ormond was available for discussion regarding this case.   _______________________________________________________________________ For Office Staff:  Number of people involved in session: 1 Was an Intern/ student involved with case: no

## 2021-09-22 ENCOUNTER — Telehealth: Payer: Self-pay | Admitting: Licensed Clinical Social Worker

## 2021-09-22 ENCOUNTER — Ambulatory Visit: Payer: Self-pay | Admitting: Licensed Clinical Social Worker

## 2021-09-22 ENCOUNTER — Encounter: Payer: Self-pay | Admitting: Licensed Clinical Social Worker

## 2021-09-22 DIAGNOSIS — Z1379 Encounter for other screening for genetic and chromosomal anomalies: Secondary | ICD-10-CM | POA: Insufficient documentation

## 2021-09-22 HISTORY — DX: Encounter for other screening for genetic and chromosomal anomalies: Z13.79

## 2021-09-22 NOTE — Progress Notes (Signed)
HPI:  [name] was previously seen in the [place] clinic due to a personal history of polyps and concerns regarding a hereditary predisposition to cancer. Please refer to our prior cancer genetics clinic note for more information regarding our discussion, assessment and recommendations, at the time. [name] recent genetic test results were disclosed to him, as were recommendations warranted by these results. These results and recommendations are discussed in more detail below. ? ?CANCER HISTORY:  ?Oncology History  ? No history exists.  ? ? ?FAMILY HISTORY:  ?We obtained a detailed, 4-generation family history.  Significant diagnoses are listed below: ?Family History  ?Problem Relation Age of Onset  ? Cancer Father   ? ?[name] has 1 son, 42, no cancer. He is unsure if he's had a colonoscopy. He has 1 granddaughter. He also had 3 brothers and 4 sisters, none have had cancer and he is unsure about polyp history for them. ?  ?[name]'s mother died at 9. Patient had 4 maternal uncles, 3 aunts. No known cancers for them or for cousins or grandparents on this side of the family. ?  ?[name]'s father died at 46 of cancer, unsure type. Patient had 1 paternal uncle and 3 paternal aunts. His uncle also had cancer, possibly throat cancer and history of smoking. No other known cancers on this side of the family. ?  ?[name] is unaware of previous family history of genetic testing for hereditary cancer risks.  There is no reported Ashkenazi Jewish ancestry. There is no known consanguinity. ?  ? ? ? ?GENETIC TEST RESULTS: Genetic testing reported out on 09/18/2021 through the [facility] Multi-Cancer+RNA cancer panel found no pathogenic mutations.  ? ?The Multi-Cancer Panel + RNA offered by [facility] includes sequencing and/or deletion duplication testing of the following 84 genes: AIP, ALK, APC, ATM, AXIN2,BAP1,  BARD1, BLM, BMPR1A, BRCA1, BRCA2, BRIP1, CASR, CDC73, CDH1, CDK4, CDKN1B,  CDKN1C, CDKN2A (p14ARF), CDKN2A (p16INK4a), CEBPA, CHEK2, CTNNA1, DICER1, DIS3L2, EGFR (c.2369C>T, p.Thr790Met variant only), EPCAM (Deletion/duplication testing only), FH, FLCN, GATA2, GPC3, GREM1 (Promoter region deletion/duplication testing only), HOXB13 (c.251G>A, p.Gly84Glu), HRAS, KIT, MAX, MEN1, MET, MITF (c.952G>A, p.Glu318Lys variant only), MLH1, MSH2, MSH3, MSH6, MUTYH, NBN, NF1, NF2, NTHL1, PALB2, PDGFRA, PHOX2B, PMS2, POLD1, POLE, POT1, PRKAR1A, PTCH1, PTEN, RAD50, RAD51C, RAD51D, RB1, RECQL4, RET, RUNX1, SDHAF2, SDHA (sequence changes only), SDHB, SDHC, SDHD, SMAD4, SMARCA4, SMARCB1, SMARCE1, STK11, SUFU, TERC, TERT, TMEM127, TP53, TSC1, TSC2, VHL, WRN and WT1.  ? ?The test report has been scanned into EPIC and is located under the Molecular Pathology section of the Results Review tab.  A portion of the result report is included below for reference.  ? ? ? ?We discussed that because current genetic testing is not perfect, it is possible there may be a gene mutation in one of these genes that current testing cannot detect, but that chance is small.  There could be another gene that has not yet been discovered, or that we have not yet tested, that is responsible for the cancer diagnoses in the family. It is also possible there is a hereditary cause for the cancer in the family that [name] did not inherit and therefore was not identified in his testing.  Therefore, it is important to remain in touch with cancer genetics in the future so that we can continue to offer [name] the most up to date genetic testing.  ? ?Genetic testing did identify a variant of uncertain significance (VUS) in the BLM gene  called c.800-3T>G (intronic).  At this time, it is unknown if this variant is associated with increased cancer risk or if this is a normal finding, but most variants such as this get reclassified to being inconsequential. It should not be used to make medical management decisions. With time, we suspect  the lab will determine the significance of this variant, if any. If we do learn more about it we will try to contact [name] to discuss it further. However, it is important to stay in touch with [place] periodically and keep the address and phone number up to date. ? ?ADDITIONAL GENETIC TESTING: We discussed with [name] that his genetic testing was fairly extensive.  If there are genes identified to increase cancer risk that can be analyzed in the future, we would be happy to discuss and coordinate this testing at that time.   ? ?CANCER SCREENING RECOMMENDATIONS: [name] test result is considered negative (normal).  This means that we have not identified a hereditary cause for his  personal history of polyps and family history of cancer at this time.  ? ?While reassuring, this does not definitively rule out a hereditary predisposition to cancer. It is still possible that there could be genetic mutations that are undetectable by current technology. There could be genetic mutations in genes that have not been tested or identified to increase cancer risk.  Therefore, it is recommended he continue to follow the cancer management and screening guidelines provided by his primary healthcare provider.  ? ?An individual's cancer risk and medical management are not determined by genetic test results alone. Overall cancer risk assessment incorporates additional factors, including personal medical history, family history, and any available genetic information that may result in a personalized plan for cancer prevention and surveillance. ? ?This negative genetic test simply tells [place] that we cannot yet define why [name] has had  an increased number of colorectal polyps. [name] medical management and screening should be based on the prospect that he  will likely form more colon polyps and should, therefore, undergo more frequent colonoscopy screening at intervals determined by his GI providers.  We also  recommended that [name] have an upper endoscopy periodically. ? ?RECOMMENDATIONS FOR FAMILY MEMBERS:  Relatives in this family might be at some increased risk of developing cancer, over the general population risk, simply due to the family history of cancer.  We recommended male relatives in this family have a yearly mammogram beginning at age 81, or 59 years younger than the earliest onset of cancer, an annual clinical breast exam, and perform monthly breast self-exams. Male relatives in this family should also have a gynecological exam as recommended by their primary provider.  All family members should be referred for colonoscopy starting at age 7.  ? ?FOLLOW-UP: Lastly, we discussed with [name] that cancer genetics is a rapidly advancing field and it is possible that new genetic tests will be appropriate for him and/or his family members in the future. We encouraged him to remain in contact with cancer genetics on an annual basis so we can update his personal and family histories and let him know of advances in cancer genetics that may benefit this family.  ? ?Our contact number was provided. [name] questions were answered to his satisfaction, and he knows he is welcome to call us at anytime with additional questions or concerns.  ? ?Faith Rogue, MS, LCGC ?Genetic Counselor ?[email or web address] ?Phone: [phone] ? ?

## 2021-09-22 NOTE — Telephone Encounter (Signed)
Revealed negative genetic testing.  Revealed that a VUS in BLM was identified. This normal result is reassuring..  It is unlikely that there is an increased risk of cancer due to a mutation in one of these genes.  However, genetic testing is not perfect, and cannot definitively rule out a hereditary cause.  It will be important for him to keep in contact with genetics to learn if any additional testing may be needed in the future.    ? ? ?

## 2022-02-20 IMAGING — DX DG CHEST 1V
1 series · 1 of 1 positions shown · non-contrast
Comparison: Chest radiograph dated 08/06/2019.

CLINICAL DATA: 74-year-old male with tachycardia.

EXAM:
CHEST  1 VIEW

[chest ap]
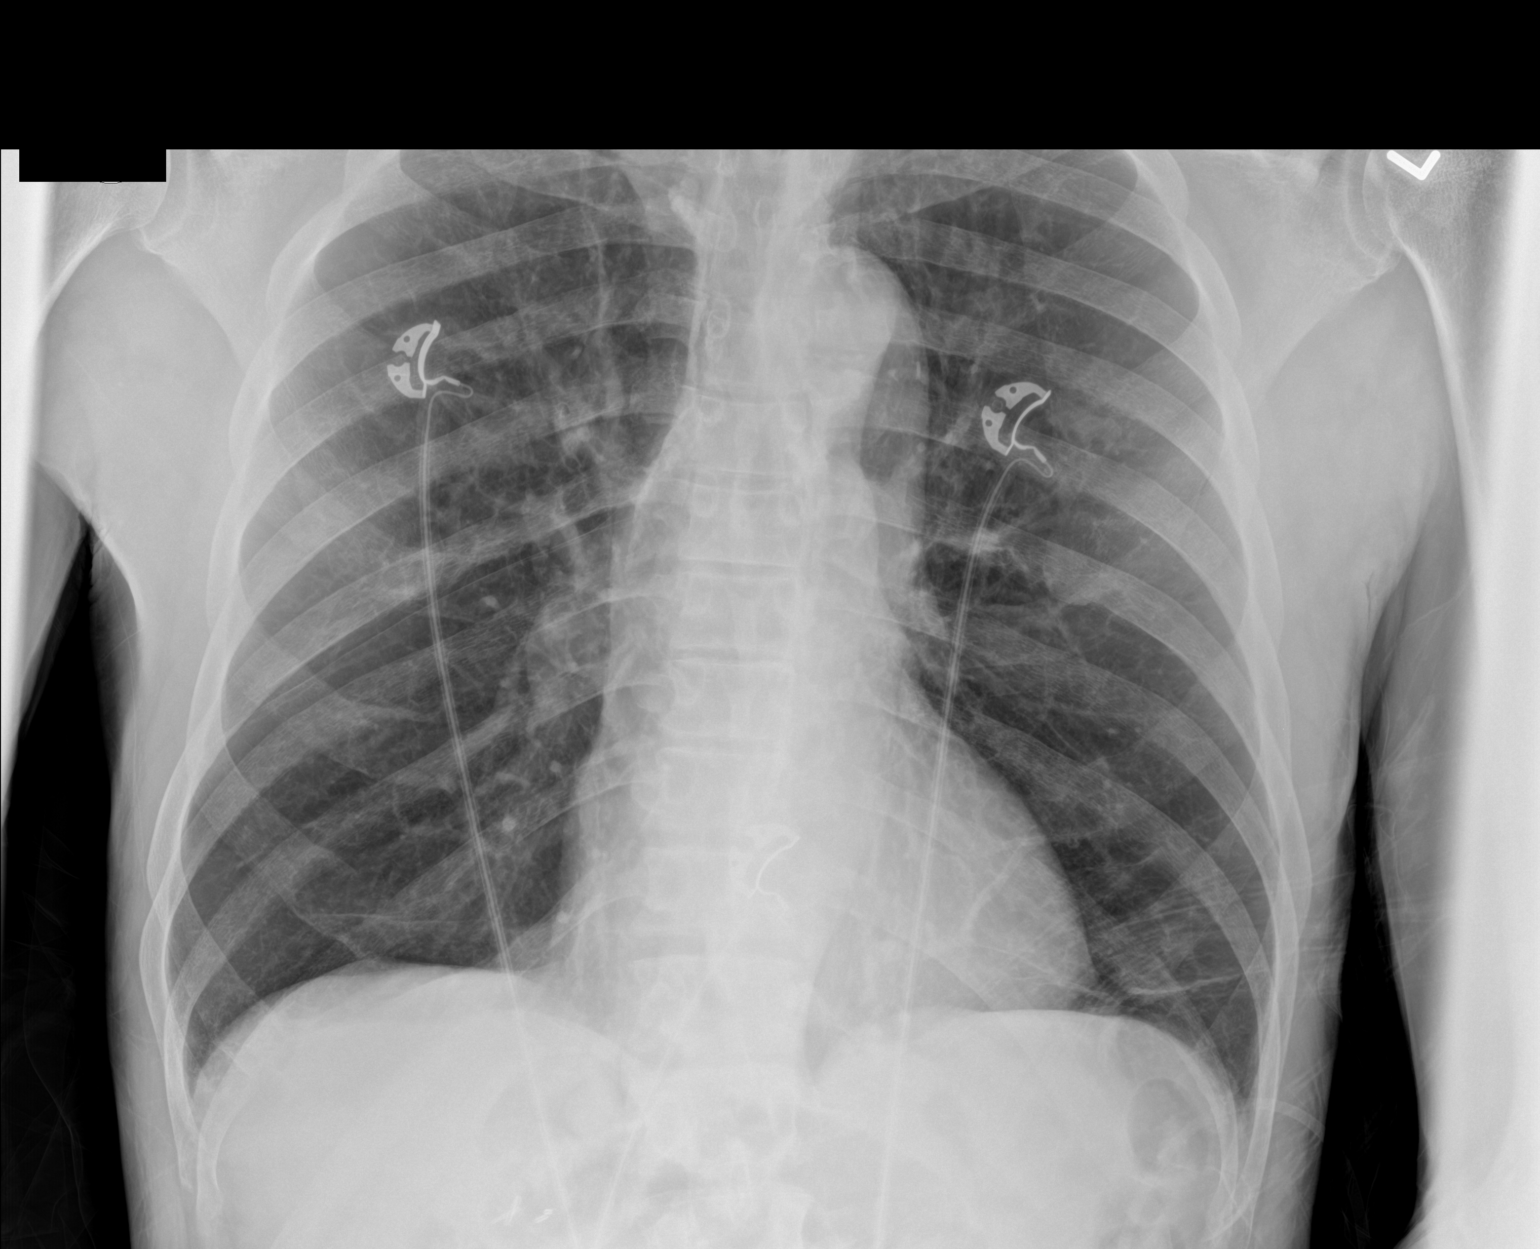

[1 of 1 positions shown; findings below may reference images not displayed]

FINDINGS: Left lung base linear atelectasis/scarring. No focal consolidation,
pleural effusion, or pneumothorax. The cardiac silhouette is within
limits. No acute osseous pathology. Cholecystectomy clips.
IMPRESSION: No active disease.

## 2022-02-21 IMAGING — US US RENAL
1 series · 14 of 25 positions shown · non-contrast
Comparison: None.

CLINICAL DATA: Acute renal failure

EXAM:
RENAL / URINARY TRACT ULTRASOUND COMPLETE

[Series 1: us renal · 14 of 36 slices shown]
[im 1/36]
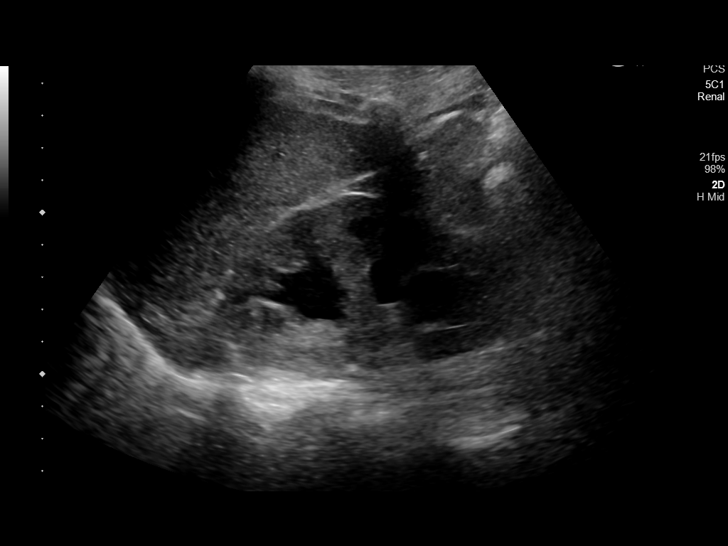
[im 3/36]
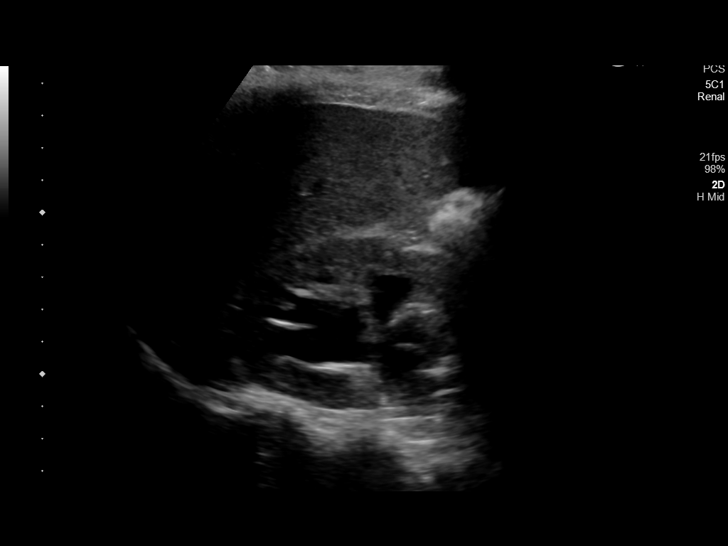
[im 6/36]
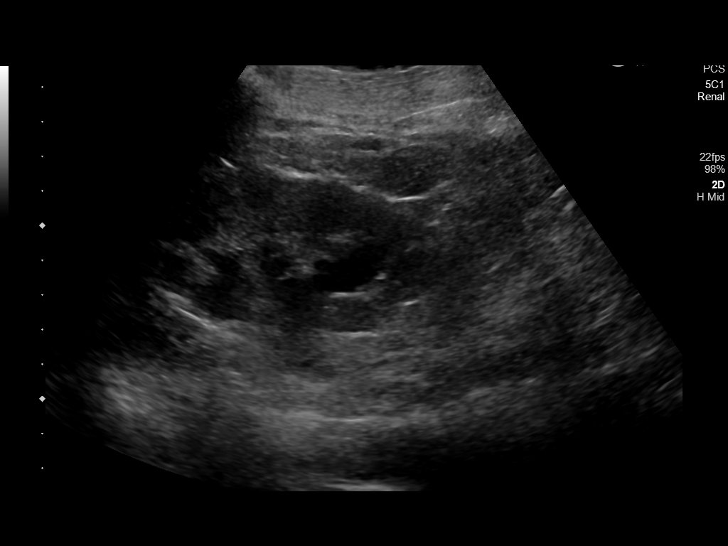
[im 9/36]
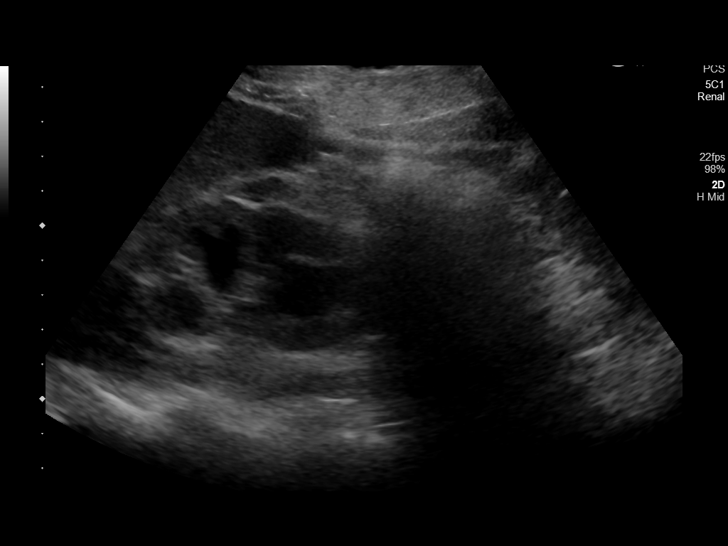
[im 12/36]
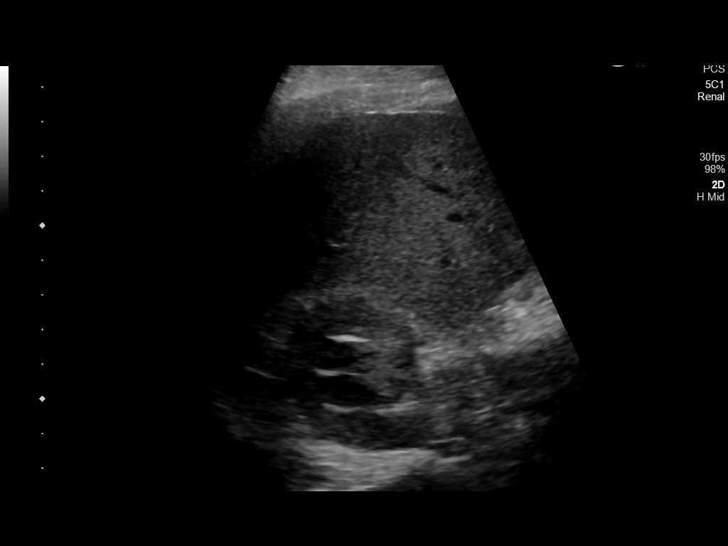
[im 14/36]
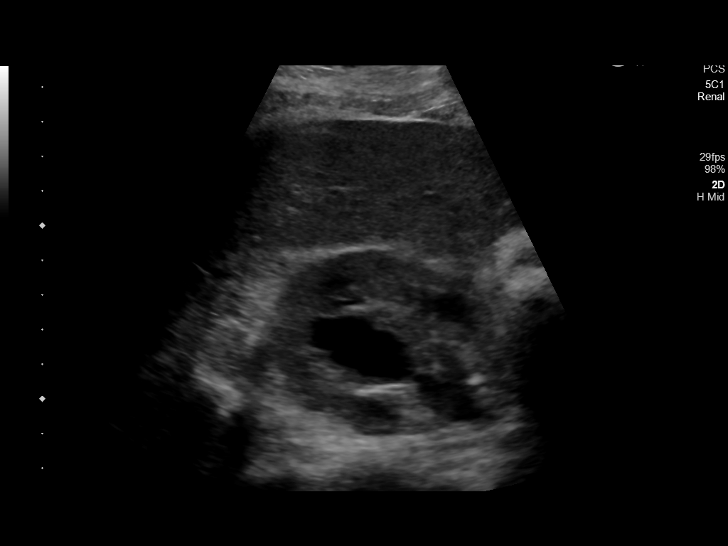
[im 17/36]
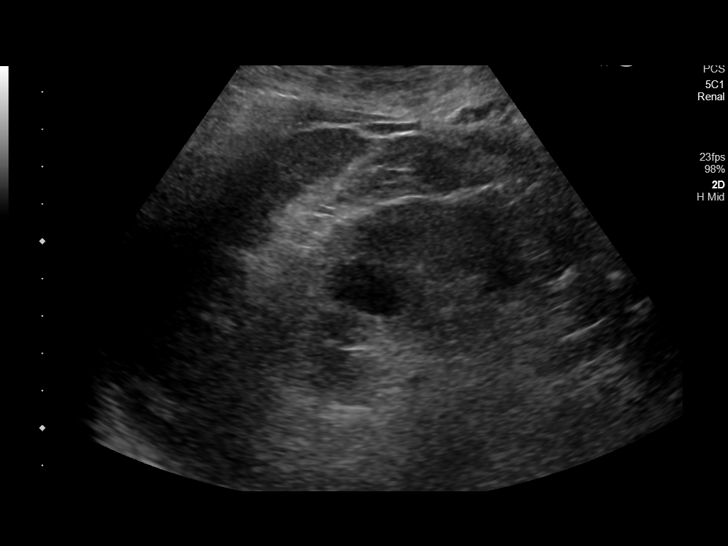
[im 19/36]
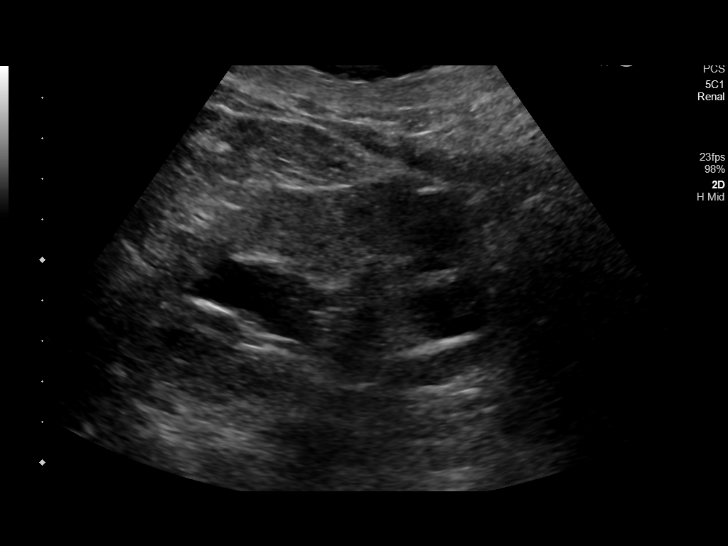
[im 22/36]
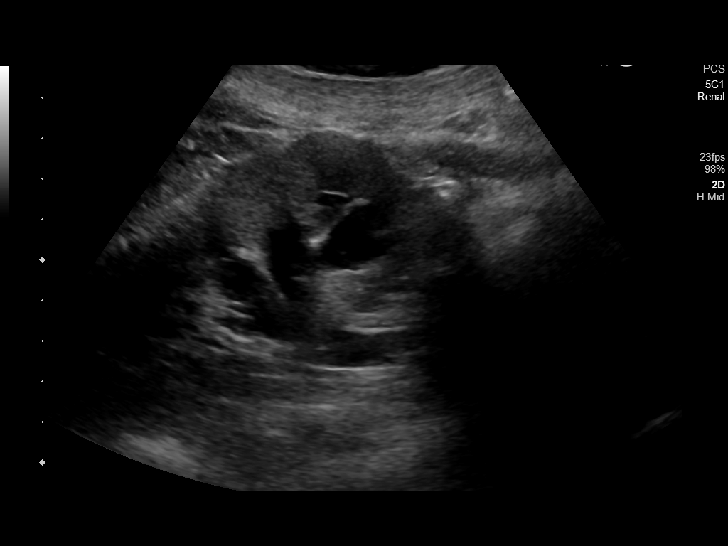
[im 24/36]
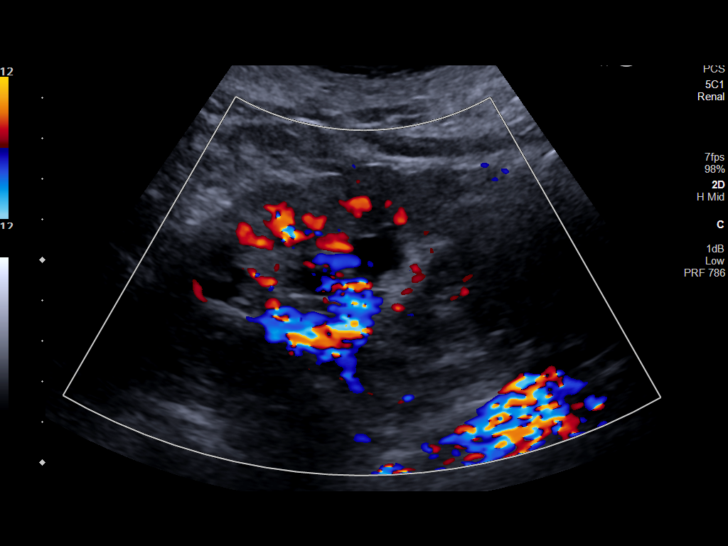
[im 27/36]
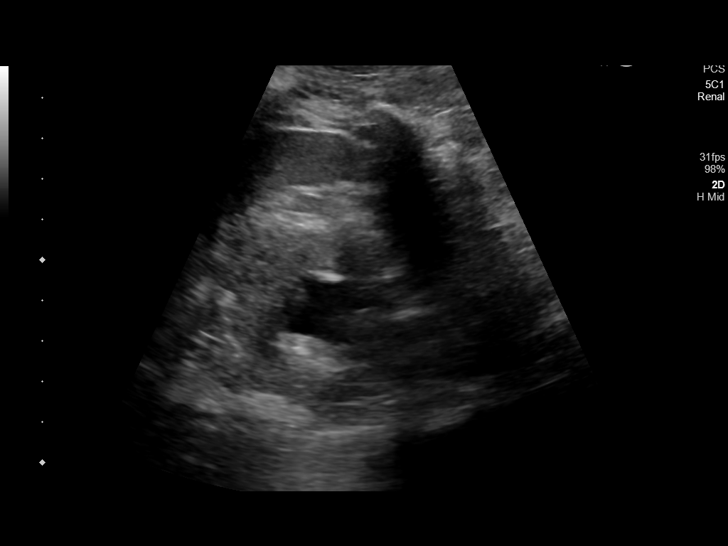
[im 30/36]
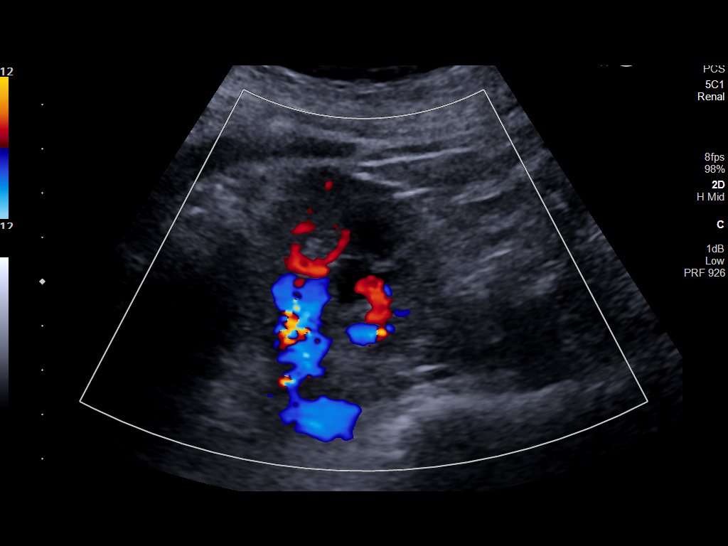
[im 33/36]
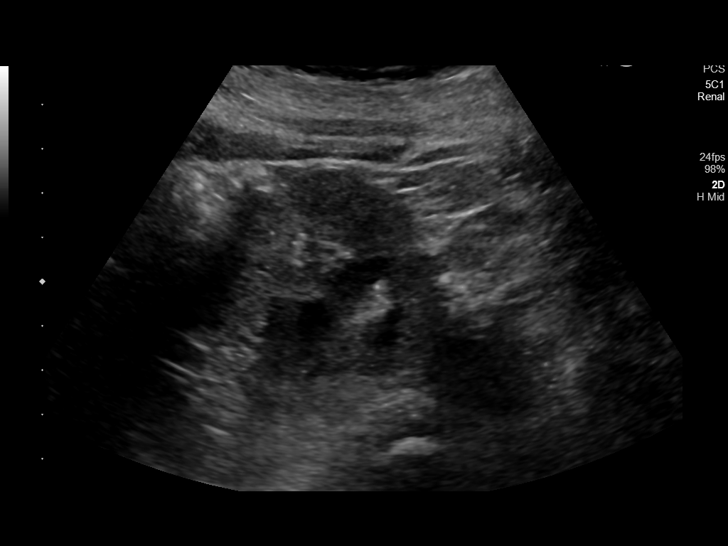
[im 36/36]
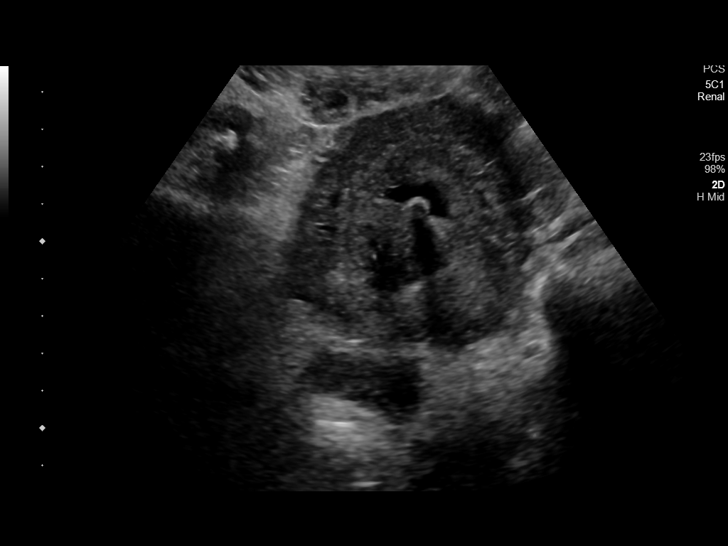

[14 of 25 positions shown; findings below may reference images not displayed]

FINDINGS: Right Kidney:

Renal measurements: 9.8 x 5.5 x 6.1 cm = volume: 171.3 mL. Moderate
right hydronephrosis. Cortical echogenicity within normal limits.

Left Kidney:

Renal measurements: 8.9 x 4.9 x 4.4 cm = volume: 112.7 mL. Moderate
left hydronephrosis. No mass. Cortical echogenicity within normal
limits.

Bladder:

Decompressed by Foley catheter.  Bladder appears thick walled.

Other:

None.
IMPRESSION: 1. Moderate bilateral hydronephrosis.
2. Decompressed urinary bladder by Foley catheter, bladder wall
appears thickened.

## 2022-06-03 IMAGING — CT CT HEAD W/O CM
3 series · 16 of 46 positions shown, 19 images · non-contrast
Comparison: 08/06/2019 head CT.

CLINICAL DATA: Dizziness, nonspecific.

EXAM:
CT HEAD WITHOUT CONTRAST
TECHNIQUE: Contiguous axial images were obtained from the base of the skull
through the vertex without intravenous contrast.

[Series 2: head wo · axial · 0.41mm/px · z∈[-102,+18]mm · 10 of 29 slices shown, 13 images]
[im 3/29  brain]
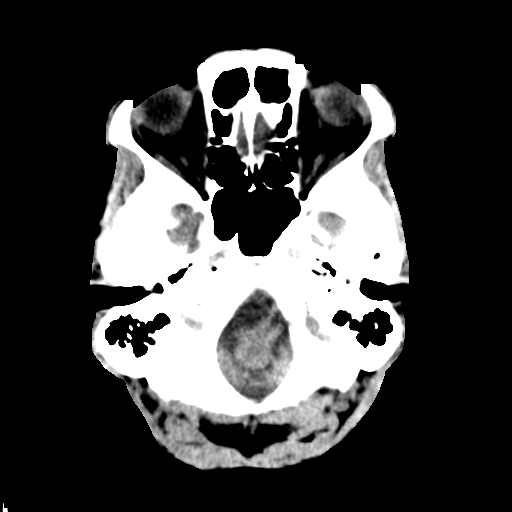
[im 3/29  bone]
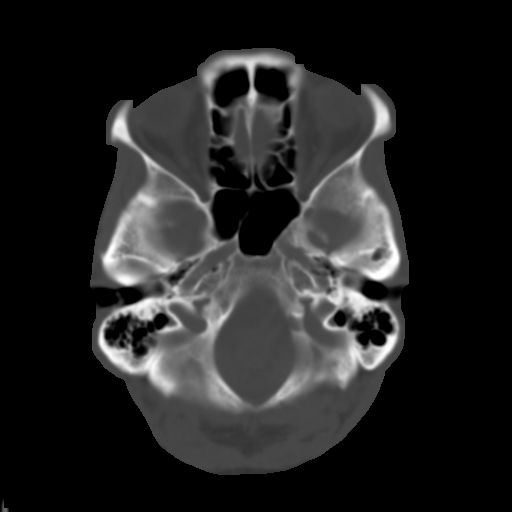
[im 6/29  brain]
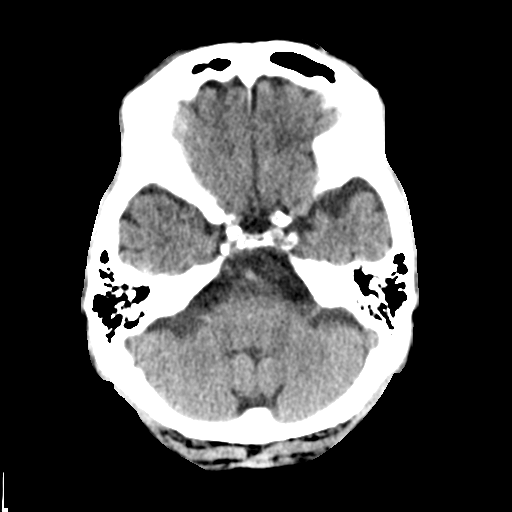
[im 8/29  brain]
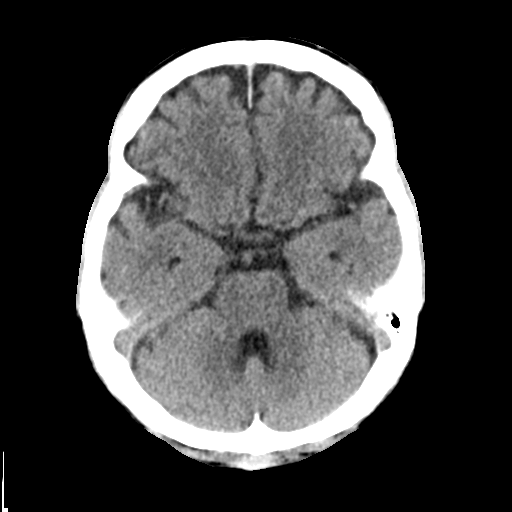
[im 11/29  brain]
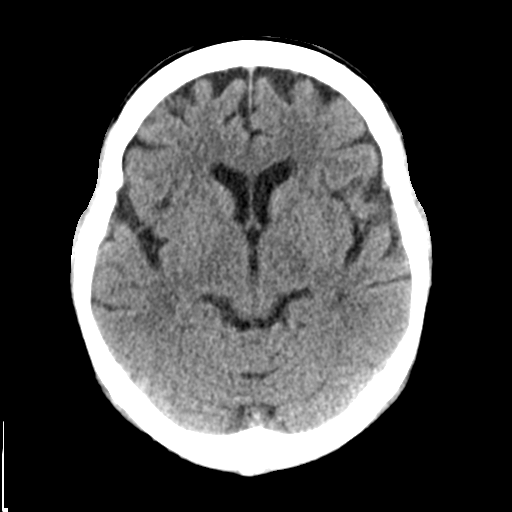
[im 14/29  brain]
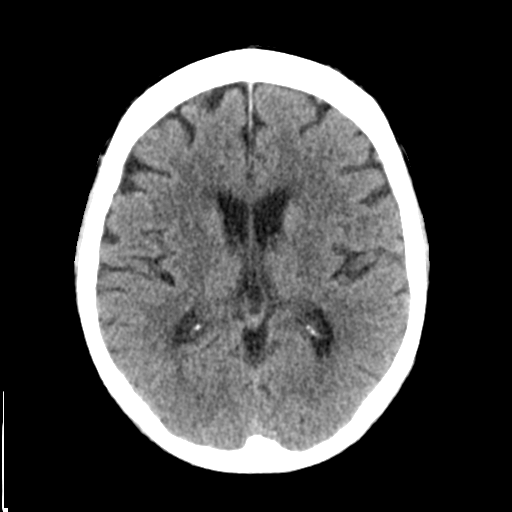
[im 14/29  bone]
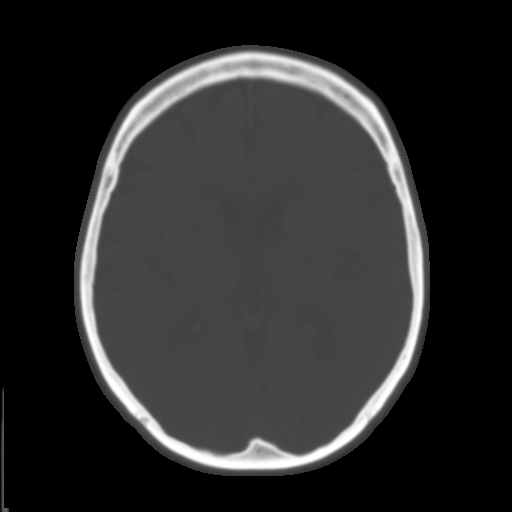
[im 16/29  brain]
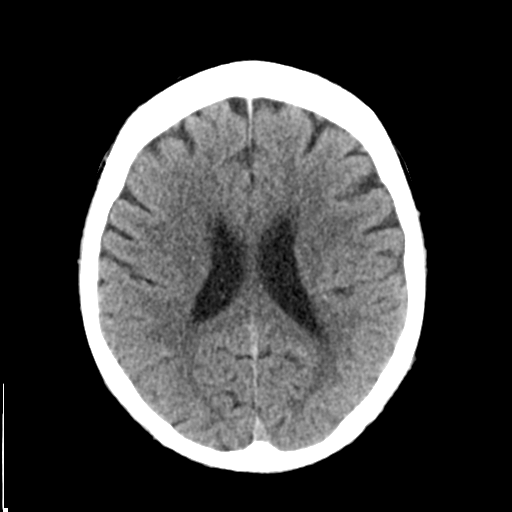
[im 19/29  brain]
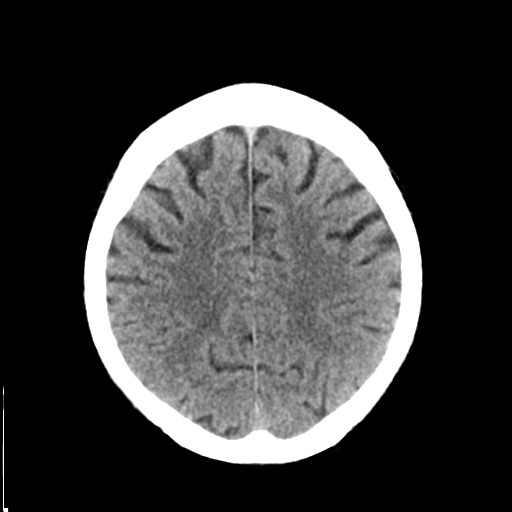
[im 22/29  brain]
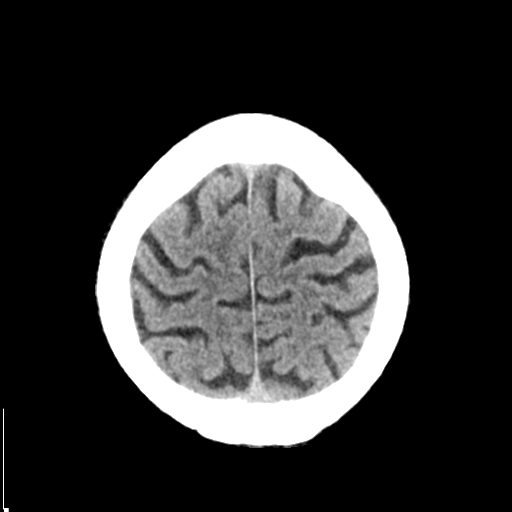
[im 24/29  brain]
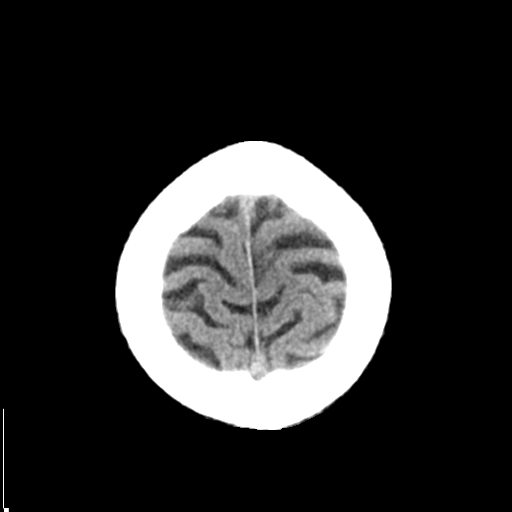
[im 24/29  bone]
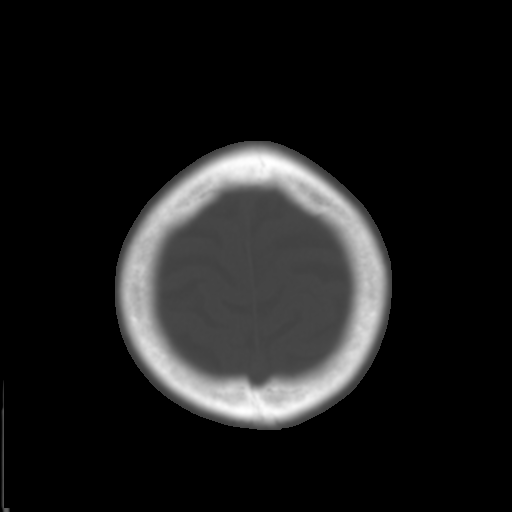
[im 27/29  brain]
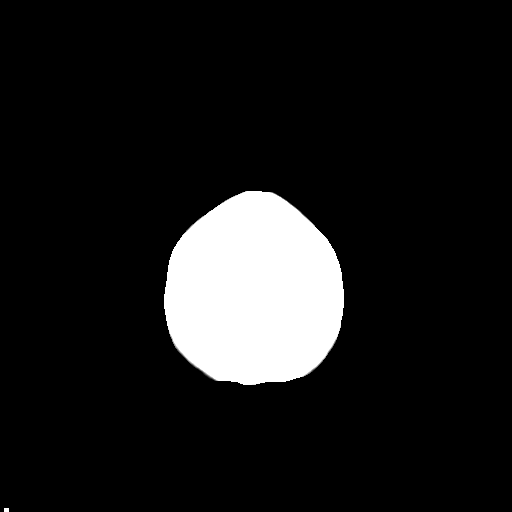

[Series 4: coronal soft tissue · coronal · 0.30mm/px · 3 of 65 slices shown]
[im 22/65  brain]
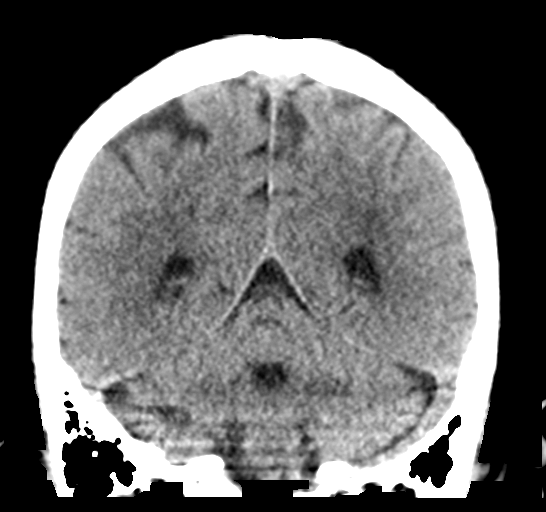
[im 29/65  brain]
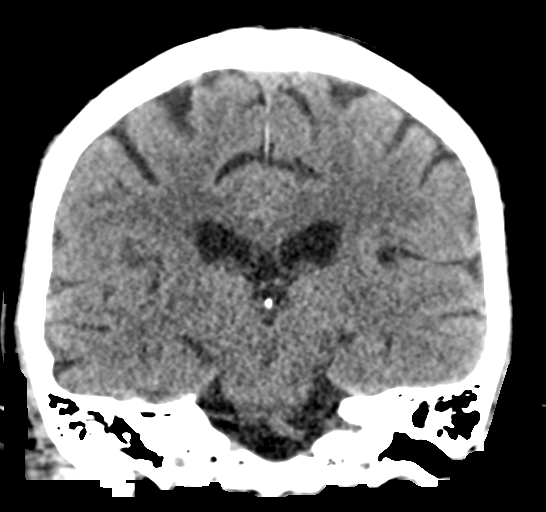
[im 36/65  brain]
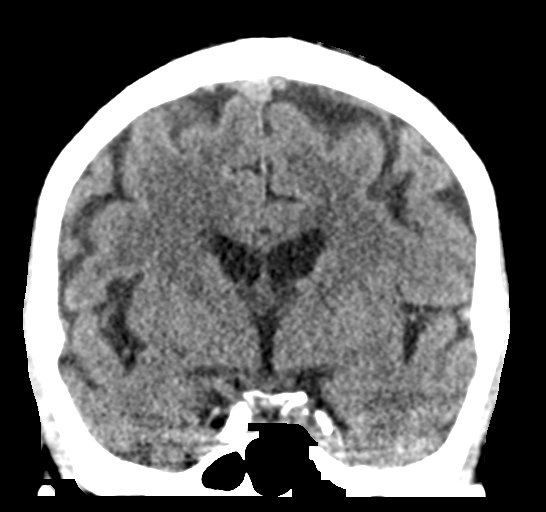

[Series 5: sagittal soft tissue · sagittal · 0.30mm/px · 3 of 53 slices shown]
[im 18/53  brain]
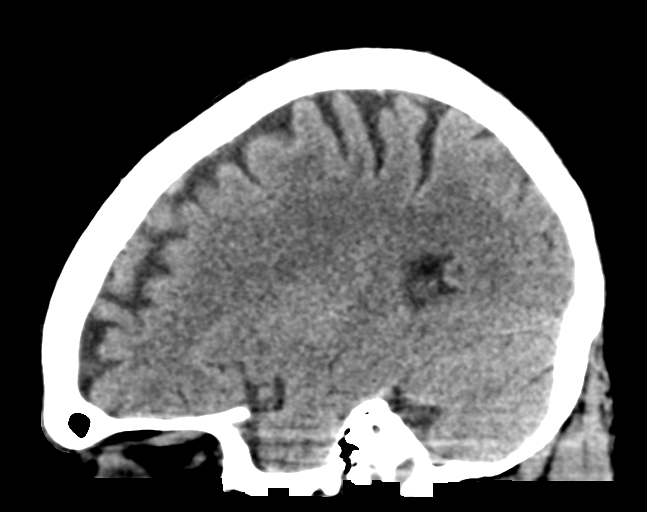
[im 27/53  brain]
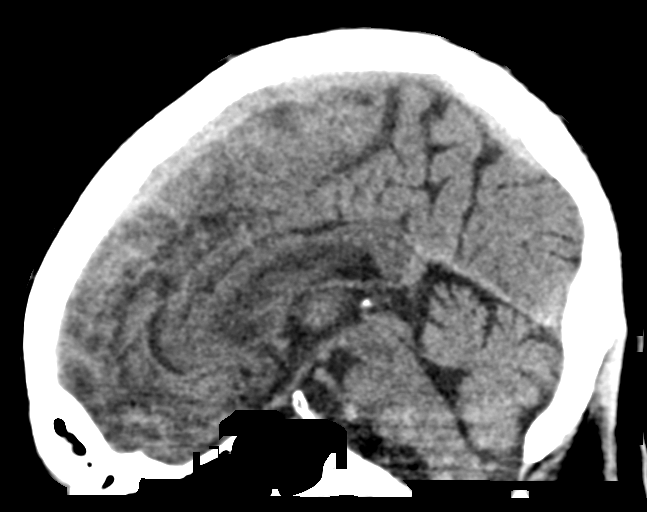
[im 35/53  brain]
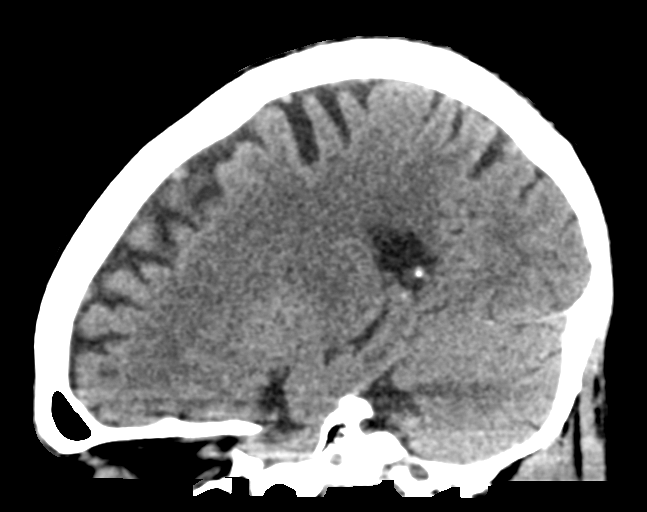

[16 of 46 positions shown; findings below may reference images not displayed]

FINDINGS: Brain: No acute infarct or intracranial hemorrhage. No mass lesion.
No midline shift, ventriculomegaly or extra-axial fluid collection.

Vascular: No hyperdense vessel or unexpected calcification.

Skull: Negative for fracture or focal lesion.

Sinuses/Orbits: Normal orbits. Clear paranasal sinuses. No mastoid
effusion.

Other: None.
IMPRESSION: No acute intracranial process.

## 2022-08-16 ENCOUNTER — Other Ambulatory Visit
Admission: RE | Admit: 2022-08-16 | Discharge: 2022-08-16 | Disposition: A | Discharge status: Home / Self Care | Payer: Medicare HMO | Attending: Cardiology | Admitting: Cardiology

## 2022-08-16 ENCOUNTER — Ambulatory Visit: Payer: Medicare HMO | Attending: Cardiology | Admitting: Cardiology

## 2022-08-16 ENCOUNTER — Encounter: Payer: Self-pay | Admitting: Cardiology

## 2022-08-16 VITALS — BP 152/80 | HR 79 | Ht 69.0 in | Wt 140.1 lb

## 2022-08-16 DIAGNOSIS — R0609 Other forms of dyspnea: Secondary | ICD-10-CM | POA: Diagnosis not present

## 2022-08-16 DIAGNOSIS — I2089 Other forms of angina pectoris: Secondary | ICD-10-CM | POA: Diagnosis present

## 2022-08-16 DIAGNOSIS — I10 Essential (primary) hypertension: Secondary | ICD-10-CM

## 2022-08-16 DIAGNOSIS — E7800 Pure hypercholesterolemia, unspecified: Secondary | ICD-10-CM | POA: Diagnosis not present

## 2022-08-16 LAB — BASIC METABOLIC PANEL
Anion gap: 7 (ref 5–15)
BUN: 24 mg/dL — ABNORMAL HIGH (ref 8–23)
CO2: 26 mmol/L (ref 22–32)
Calcium: 9.3 mg/dL (ref 8.9–10.3)
Chloride: 106 mmol/L (ref 98–111)
Creatinine, Ser: 2.03 mg/dL — ABNORMAL HIGH (ref 0.61–1.24)
GFR, Estimated: 34 mL/min — ABNORMAL LOW (ref >60)
Glucose, Bld: 96 mg/dL (ref 70–99)
Potassium: 4 mmol/L (ref 3.5–5.1)
Sodium: 139 mmol/L (ref 135–145)

## 2022-08-16 MED ORDER — METOPROLOL TARTRATE 100 MG PO TABS
100.0000 mg | ORAL_TABLET | Freq: Once | ORAL | 0 refills | Status: DC
Start: 2022-08-16 — End: 2022-09-10

## 2022-08-16 MED ORDER — IVABRADINE HCL 5 MG PO TABS: 10.0000 mg | ORAL_TABLET | Freq: Once | ORAL | 0 refills | Status: AC

## 2022-08-16 NOTE — Progress Notes (Signed)
Cardiology Office Note:    Date:  08/16/2022   ID:  Criss, Pallone 1946-04-05, MRN [id number]  PCP:  Bunnie Pion, [place] Prov[facility] Cardiologist:  Kate Sable, MD     Referring MD: Elisabeth Cara, NP   Chief Complaint  Patient presents with   referred by PCP/ SOB    History of Present Illness:    Donald Foster is a 77 y.o. male with a hx of hypertension, hyperlipidemia, former smoker x 40+ years, COPD who presents due to shortness of breath.  States having worsening dyspnea on exertion ongoing over several months.  Denies edema, denies chest pain.  Blood pressure usually controlled on current medications.  Denies any family history of heart attacks.  COPD managed by primary care physician.  Past Medical History:  Diagnosis Date   AKI (acute kidney injury) ([place]) 01/14/2020   BPH (benign prostatic hyperplasia)    COPD (chronic obstructive pulmonary disease) (HCC)    Family history of cancer    GERD (gastroesophageal reflux disease)    Hypercholesteremia    Hypertension    Personal history of colonic polyps     Past Surgical History:  Procedure Laterality Date   CHOLECYSTECTOMY     COLONOSCOPY WITH PROPOFOL N/A 08/17/2018   Procedure: COLONOSCOPY WITH PROPOFOL;  Surgeon: Lin Landsman, MD;  Location: [facility] ENDOSCOPY;  Service: Gastroenterology;  Laterality: N/A;   COLONOSCOPY WITH PROPOFOL N/A 08/04/2021   Procedure: COLONOSCOPY WITH PROPOFOL;  Surgeon: Lin Landsman, MD;  Location: [facility] ENDOSCOPY;  Service: Gastroenterology;  Laterality: N/A;   ESOPHAGOGASTRODUODENOSCOPY (EGD) WITH PROPOFOL N/A 08/01/2018   Procedure: ESOPHAGOGASTRODUODENOSCOPY (EGD) WITH PROPOFOL;  Surgeon: Lin Landsman, MD;  Location: [place];  Service: Gastroenterology;  Laterality: N/A;   HEMORRHOID SURGERY N/A 02/22/2019   Procedure: HEMORRHOIDECTOMY;  Surgeon: Jules Husbands, MD;  Location: [facility] ORS;  Service: General;  Laterality: N/A;    HOLEP-LASER ENUCLEATION OF THE PROSTATE WITH MORCELLATION N/A 02/18/2020   Procedure: HOLEP-LASER ENUCLEATION OF THE PROSTATE WITH MORCELLATION;  Surgeon: Hollice Espy, MD;  Location: [facility] ORS;  Service: Urology;  Laterality: N/A;   UPPER GI ENDOSCOPY  08/17/2018   Procedure: UPPER GI ENDOSCOPY;  Surgeon: Lin Landsman, MD;  Location: [facility] ENDOSCOPY;  Service: Gastroenterology;;    Current Medications: Current Meds  Medication Sig   albuterol (VENTOLIN HFA) 108 (90 Base) MCG/ACT inhaler Inhale 2 puffs into the lungs every 4 (four) hours as needed for wheezing or shortness of breath. Every 4-6 hrs prn   aluminum hydroxide-magnesium carbonate (GAVISCON) 95-358 MG/15ML SUSP Take 15 mLs by mouth 4 (four) times daily - after meals and at bedtime.   amLODipine (NORVASC) 10 MG tablet Take 10 mg by mouth daily.    atorvastatin (LIPITOR) 20 MG tablet Take 20 mg by mouth at bedtime.    ivabradine (CORLANOR) 5 MG TABS tablet Take 2 tablets (10 mg total) by mouth once for 1 dose. TWO HOURS PRIOR TO CARDIAC CTA   losartan (COZAAR) 25 MG tablet Take 1 tablet (25 mg) by mouth once daily   metoprolol tartrate (LOPRESSOR) 100 MG tablet Take 1 tablet (100 mg total) by mouth once for 1 dose. TWO HOURS PRIOR TO CARDIAC CTA   omeprazole (PRILOSEC) 40 MG capsule Take 1 tablet (40 mg) by mouth once daily   SPIRIVA HANDIHALER 18 MCG inhalation capsule Place 18 mcg into inhaler and inhale daily.    SYMBICORT 160-4.5 MCG/ACT inhaler Inhale into the lungs.   [  DISCONTINUED] omeprazole (PRILOSEC) 40 MG capsule Take 1 capsule (40 mg total) by mouth 2 (two) times daily before a meal.     Allergies:   Patient has no known allergies.   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  Tobacco Use   Smoking status: Former    Packs/day: 0.75    Years: 57.00    Total pack years: 42.75    Types: Cigarettes    Start date: 03/12/2018    Quit date: 2020    Years since quitting: 4.0   Smokeless tobacco: Never  Vaping Use   Vaping Use: Former  Substance and Sexual Activity   Alcohol use: Yes    Comment: occasional   Drug use: Not Currently   Sexual activity: Not on file  Other Topics Concern   Not on file  Social History Narrative   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  ROS:   Please see the history of present illness.     All other systems reviewed and are negative.  EKGs/Labs/Other Studies Reviewed:    The following studies were reviewed today:   EKG:  EKG is  ordered today.  The ekg ordered today demonstrates normal sinus rhythm, normal ECG  Recent Labs: No results found for requested labs within last 365 days.  Recent Lipid Panel No results found for: "CHOL", "TRIG", "HDL", "CHOLHDL", "VLDL", "LDLCALC", "LDLDIRECT"   Risk Assessment/Calculations:          Physical Exam:    VS:  BP (!) 152/80 (BP Location: Left Arm, Patient Position: Sitting, Cuff Size: Normal)   Pulse 79   Ht '5\' 9"'$  (1.753 m)   Wt 140 lb 2 oz (63.6 kg)   SpO2 95%   BMI 20.69 kg/m     Wt Readings from Last 3 Encounters:  08/16/22 140 lb 2 oz (63.6 kg)  08/04/21 135 lb (61.2 kg)  09/30/20 139 lb (63 kg)     GEN:  Well nourished, well developed in no acute distress HEENT: Normal NECK: No JVD; No carotid bruits CARDIAC: RRR, no murmurs, rubs, gallops RESPIRATORY: Diminished breath sound bilaterally, no wheezing ABDOMEN: Soft, non-tender,  non-distended MUSCULOSKELETAL:  No edema; No deformity  SKIN: Warm and dry NEUROLOGIC:  Alert and oriented x 3 PSYCHIATRIC:  Normal affect   ASSESSMENT:    1. Dyspnea on exertion   2. Primary hypertension   3. Pure hypercholesterolemia   4. Anginal equivalent    PLAN:    In order of problems listed above:  Dyspnea on exertion, this could be an anginal equivalent.  Get echocardiogram, get coronary CTA.  If cardiac workup unrevealing, consider pulmonary referral for COPD management. Hypertension, BP elevated but usually controlled.  Continue Norvasc, losartan for now. Hyperlipidemia, continue Lipitor 20 mg.  Follow-up after echo and coronary CT      Medication Adjustments/Labs and Tests Ordered: Current medicines  are reviewed at length with the patient today.  Concerns regarding medicines are outlined above.  Orders Placed This Encounter  Procedures   CT CORONARY MORPH W/CTA COR W/SCORE W/CA W/CM &/OR WO/CM   Basic Metabolic Panel (BMET)   EKG 12-Lead   ECHOCARDIOGRAM COMPLETE   Meds ordered this encounter  Medications   metoprolol tartrate (LOPRESSOR) 100 MG tablet    Sig: Take 1 tablet (100 mg total) by mouth once for 1 dose. TWO HOURS PRIOR TO CARDIAC CTA    Dispense:  1 tablet    Refill:  0   ivabradine (CORLANOR) 5 MG TABS tablet    Sig: Take 2 tablets (10 mg total) by mouth once for 1 dose. TWO HOURS PRIOR TO CARDIAC CTA    Dispense:  2 tablet    Refill:  0    Patient Instructions  Medication Instructions:   Your physician recommends that you continue on your current medications as directed. Please refer to the Current Medication list given to you today.  *If you need a refill on your cardiac medications before your next appointment, please call your pharmacy*   Lab Work:  Your physician recommends you go to the medical mall for lab work.   If you have labs (blood work) drawn today and your tests are completely normal, you will receive your results only  by: [place] (if you have MyChart) OR A paper copy in the mail If you have any lab test that is abnormal or we need to change your treatment, we will call you to review the results.   Testing/Procedures:  Your physician has requested that you have an echocardiogram. Echocardiography is a painless test that uses sound waves to create images of your heart. It provides your doctor with information about the size and shape of your heart and how well your heart's chambers and valves are working. This procedure takes approximately one hour. There are no restrictions for this procedure. Please do NOT wear cologne, perfume, aftershave, or lotions (deodorant is allowed). Please arrive 15 minutes prior to your appointment time.    Your cardiac CT will be scheduled on February 22nd @ 9:30 am [facility] [address] [place], [place] [address] [phone]  If scheduled at [facility] or [facility], please arrive 15 mins early for check-in and test prep.   Please follow these instructions carefully (unless otherwise directed):  Hold all erectile dysfunction medications at least 3 days (72 hrs) prior to test. (Ie viagra, cialis, sildenafil, tadalafil, etc) We will administer nitroglycerin during this exam.   On the Night Before the Test: Be sure to Drink plenty of water. Do not consume any caffeinated/decaffeinated beverages or chocolate 12 hours prior to your test. Do not take any antihistamines 12 hours prior to your test.  On the Day of the Test: Drink plenty of water until 1 hour prior to the test. Do not eat any food 1 hour prior to test. You may take your regular medications prior to the test.  Take metoprolol (Lopressor) two hours prior to test. Take corlanor two hours prior to test HOLD Furosemide/Hydrochlorothiazide morning of the test.  After the Test: Drink plenty of  water. After receiving IV contrast, you may experience a mild flushed feeling. This is normal. On occasion, you may experience a mild rash up to 24 hours after the test. This is not dangerous. If this occurs, you can take Benadryl 25 mg and  increase your fluid intake. If you experience trouble breathing, this can be serious. If it is severe call 911 IMMEDIATELY. If it is mild, please call our office.  For scheduling needs, including cancellations and rescheduling, please call [place], [phone].   Follow-Up: At [facility], you and your health needs are our priority.  As part of our continuing mission to provide you with exceptional heart care, we have created designated Provider Care Teams.  These Care Teams include your primary Cardiologist (physician) and Advanced Practice Providers (APPs -  Physician Assistants and Nurse Practitioners) who all work together to provide you with the care you need, when you need it.  We recommend signing up for the patient portal called "MyChart".  Sign up information is provided on this After Visit Summary.  MyChart is used to connect with patients for Virtual Visits (Telemedicine).  Patients are able to view lab/test results, encounter notes, upcoming appointments, etc.  Non-urgent messages can be sent to your provider as well.   To learn more about what you can do with MyChart, go to [email or web address].    Your next appointment:    After testing  Provider:   You may see Kate Sable, MD or one of the following Advanced Practice Providers on your designated Care Team:   Murray Hodgkins, NP Christell Faith, PA-C Cadence Kathlen Mody, PA-C Gerrie Nordmann, NP    Signed, Kate Sable, MD  08/16/2022 11:47 AM    [place]

## 2022-08-16 NOTE — Patient Instructions (Signed)
Medication Instructions:   Your physician recommends that you continue on your current medications as directed. Please refer to the Current Medication list given to you today.  *If you need a refill on your cardiac medications before your next appointment, please call your pharmacy*   Lab Work:  Your physician recommends you go to the medical mall for lab work.   If you have labs (blood work) drawn today and your tests are completely normal, you will receive your results only by: [place] (if you have MyChart) OR A paper copy in the mail If you have any lab test that is abnormal or we need to change your treatment, we will call you to review the results.   Testing/Procedures:  Your physician has requested that you have an echocardiogram. Echocardiography is a painless test that uses sound waves to create images of your heart. It provides your doctor with information about the size and shape of your heart and how well your heart's chambers and valves are working. This procedure takes approximately one hour. There are no restrictions for this procedure. Please do NOT wear cologne, perfume, aftershave, or lotions (deodorant is allowed). Please arrive 15 minutes prior to your appointment time.    Your cardiac CT will be scheduled on February 22nd @ 9:30 am [facility] [address] [place], [place] [address] [phone]  If scheduled at [facility] or [facility], please arrive 15 mins early for check-in and test prep.   Please follow these instructions carefully (unless otherwise directed):  Hold all erectile dysfunction medications at least 3 days (72 hrs) prior to test. (Ie viagra, cialis, sildenafil, tadalafil, etc) We will administer nitroglycerin during this exam.   On the Night Before the Test: Be sure to Drink plenty of water. Do not consume any caffeinated/decaffeinated  beverages or chocolate 12 hours prior to your test. Do not take any antihistamines 12 hours prior to your test.  On the Day of the Test: Drink plenty of water until 1 hour prior to the test. Do not eat any food 1 hour prior to test. You may take your regular medications prior to the test.  Take metoprolol (Lopressor) two hours prior to test. Take corlanor two hours prior to test HOLD Furosemide/Hydrochlorothiazide morning of the test.  After the Test: Drink plenty of water. After receiving IV contrast, you may experience a mild flushed feeling. This is normal. On occasion, you may experience a mild rash up to 24 hours after the test. This is not dangerous. If this occurs, you can take Benadryl 25 mg and increase your fluid intake. If you experience trouble breathing, this can be serious. If it is severe call 911 IMMEDIATELY. If it is mild, please call our office.  For scheduling needs, including cancellations and rescheduling, please call [place], [phone].   Follow-Up: At [facility], you and your health needs are our priority.  As part of our continuing mission to provide you with exceptional heart care, we have created designated Provider Care Teams.  These Care Teams include your primary Cardiologist (physician) and Advanced Practice Providers (APPs -  Physician Assistants and Nurse Practitioners) who all work together to provide you with the care you need, when you need it.  We recommend signing up for the patient portal called "MyChart".  Sign up information is provided on this After Visit Summary.  MyChart is used to connect with patients for Virtual Visits (Telemedicine).  Patients  are able to view lab/test results, encounter notes, upcoming appointments, etc.  Non-urgent messages can be sent to your provider as well.   To learn more about what you can do with MyChart, go to [email or web address].    Your next appointment:    After testing  Provider:   You may  see Kate Sable, MD or one of the following Advanced Practice Providers on your designated Care Team:   Murray Hodgkins, NP Christell Faith, PA-C Cadence Kathlen Mody, PA-C Gerrie Nordmann, NP

## 2022-08-17 ENCOUNTER — Telehealth: Payer: Self-pay

## 2022-08-17 DIAGNOSIS — N179 Acute kidney failure, unspecified: Secondary | ICD-10-CM

## 2022-08-17 DIAGNOSIS — R079 Chest pain, unspecified: Secondary | ICD-10-CM

## 2022-08-17 NOTE — Telephone Encounter (Signed)
-----   Message from Kate Sable, MD sent at 08/16/2022  5:21 PM EST ----- Creatinine abnormal/CKD.  Cancel coronary CTA, obtain Lexiscan Myoview instead.  Diagnosis chest pain.  Refer patient to nephrology as renal dysfunction appears chronic, if Patient does not have a primary nephrologist.  Thank you

## 2022-08-17 NOTE — Telephone Encounter (Signed)
Called patient. No answer. VM was full.   Refer has been placed.  CTA has been cancelled.  Myoview has been ordered.   Will mail results and new instructions to the patient.

## 2022-08-18 ENCOUNTER — Ambulatory Visit: Payer: Medicare HMO | Attending: Cardiology

## 2022-08-18 DIAGNOSIS — I2089 Other forms of angina pectoris: Secondary | ICD-10-CM | POA: Diagnosis not present

## 2022-08-18 DIAGNOSIS — R0609 Other forms of dyspnea: Secondary | ICD-10-CM

## 2022-08-18 LAB — ECHOCARDIOGRAM COMPLETE
AR max vel: 4.11 cm2
AV Area VTI: 4.22 cm2
AV Area mean vel: 4.11 cm2
AV Mean grad: 1 mmHg
AV Peak grad: 2.6 mmHg
AV Vena cont: 0.3 cm
Ao pk vel: 0.81 m/s
Area-P 1/2: 2.11 cm2
Calc EF: 48.5 %
P 1/2 time: 411 msec
S' Lateral: 3.3 cm
Single Plane A2C EF: 47.5 %
Single Plane A4C EF: 47.7 %

## 2022-08-19 NOTE — Progress Notes (Deleted)
Cardiology Office Note    Date:  08/19/2022   ID:  Donald, Foster April 29, 1946, MRN [id number]  PCP:  Donald Pion, FNP  Cardiologist:  Donald Sable, MD  Electrophysiologist:  None   Chief Complaint: ***  History of Present Illness:   Donald Foster is a 77 y.o. male with history of ***  ***   Labs independently reviewed: 08/2022 - potassium 4.0, BUN 24, serum creatinine 2.03 04/2020 - albumin 3.7, AST/ALT normal, Hgb 11.9, PLT 191 01/2020 - magnesium 1.5  Past Medical History:  Diagnosis Date   AKI (acute kidney injury) ([place]) 01/14/2020   BPH (benign prostatic hyperplasia)    COPD (chronic obstructive pulmonary disease) ([place])    Family history of cancer    GERD (gastroesophageal reflux disease)    Hypercholesteremia    Hypertension    Personal history of colonic polyps     Past Surgical History:  Procedure Laterality Date   CHOLECYSTECTOMY     COLONOSCOPY WITH PROPOFOL N/A 08/17/2018   Procedure: COLONOSCOPY WITH PROPOFOL;  Surgeon: Donald Landsman, MD;  Location: [facility] ENDOSCOPY;  Service: Gastroenterology;  Laterality: N/A;   COLONOSCOPY WITH PROPOFOL N/A 08/04/2021   Procedure: COLONOSCOPY WITH PROPOFOL;  Surgeon: Donald Landsman, MD;  Location[facility] ENDOSCOPY;  Service: Gastroenterology;  Laterality: N/A;   ESOPHAGOGASTRODUODENOSCOPY (EGD) WITH PROPOFOL N/A 08/01/2018   Procedure: ESOPHAGOGASTRODUODENOSCOPY (EGD) WITH PROPOFOL;  Surgeon: Donald Landsman, MD;  Location: [place];  Service: Gastroenterology;  Laterality: N/A;   HEMORRHOID SURGERY N/A 02/22/2019   Procedure: HEMORRHOIDECTOMY;  Surgeon: Donald Husbands, MD;  Location: [facility] ORS;  Service: General;  Laterality: N/A;   HOLEP-LASER ENUCLEATION OF THE PROSTATE WITH MORCELLATION N/A 02/18/2020   Procedure: HOLEP-LASER ENUCLEATION OF THE PROSTATE WITH MORCELLATION;  Surgeon: Donald Espy, MD;  Location: [facility] ORS;  Service: Urology;  Laterality: N/A;   UPPER GI ENDOSCOPY  08/17/2018    Procedure: UPPER GI ENDOSCOPY;  Surgeon: Donald Landsman, MD;  Location: [facility] ENDOSCOPY;  Service: Gastroenterology;;    Current Medications: No outpatient medications have been marked as taking for the 08/23/22 encounter (Appointment) with Donald Mu, PA-C.    Allergies:   Patient has no known allergies.   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  Tobacco Use   Smoking status: Former    Packs/day: 0.75    Years: 57.00    Total pack years: 42.75    Types: Cigarettes    Start date: 03/12/2018    Quit date: 2020    Years since quitting: 4.1   Smokeless tobacco: Never  Vaping Use   Vaping Use: Former  Substance and Sexual Activity   Alcohol use: Yes    Comment: occasional   Drug use: Not Currently   Sexual activity: Not on file  Other Topics Concern   Not on file  Social History Narrative   Lives with sister, Donald Foster   Social Determinants of Health   Financial Resource Strain: Not on file  Food Insecurity: Not on file  Transportation Needs: Not on file  Physical Activity: Not on file  Stress: Not on file  Social Connections: Not on file     Family History:  The patient's family history includes Cancer in his father.  ROS:   12-point review of systems is negative unless otherwise noted in the HPI.   EKGs/Labs/Other Studies Reviewed:    Studies reviewed were summarized above. The additional studies were reviewed today:  2D echo 08/18/2022: 1. Left ventricular ejection fraction, by estimation, is 55 to 60%. The  left ventricle has normal function. The left ventricle has no regional  wall motion abnormalities. Left ventricular diastolic parameters are  consistent with Grade I diastolic  dysfunction (impaired  relaxation). The average left ventricular global  longitudinal strain is -13.0 %. The global longitudinal strain is  abnormal.   2. Right ventricular systolic function is normal. The right ventricular  size is normal.   3. The mitral valve is normal in structure. No evidence of mitral valve  regurgitation.   4. The aortic valve is tricuspid. Aortic valve regurgitation is mild.   5. Aortic dilatation noted. There is mild dilatation of the aortic root,  measuring 40 mm.   6. The inferior vena cava is normal in size with greater than 50%  respiratory variability, suggesting right atrial pressure of 3 mmHg.  __________  Carlton Adam MPI 08/25/2022: ***  EKG:  EKG is ordered today.  The EKG ordered today demonstrates ***  Recent Labs: 08/16/2022: BUN 24; Creatinine, Ser 2.03; Potassium 4.0; Sodium 139  Recent Lipid Panel No results found for: "CHOL", "TRIG", "HDL", "CHOLHDL", "VLDL", "LDLCALC", "LDLDIRECT"  PHYSICAL EXAM:    VS:  There were no vitals taken for this visit.  BMI: There is no height or weight on file to calculate BMI.  Physical Exam  Wt Readings from Last 3 Encounters:  08/16/22 140 lb 2 oz (63.6 kg)  08/04/21 135 lb (61.2 kg)  09/30/20 139 lb (63 kg)     ASSESSMENT & PLAN:   ***   {Are you ordering a CV Procedure (e.g. stress test, cath, DCCV, TEE, etc)?   Press F2        :[id number]}     Disposition: F/u with Dr. Garen Foster or an APP in ***.   Medication Adjustments/Labs and Tests Ordered: Current medicines are reviewed at length with the patient today.  Concerns regarding medicines are outlined above. Medication changes, Labs and Tests ordered today are summarized above and listed in the Patient Instructions accessible in Encounters.   Signed, Donald Faith, PA-C 08/19/2022 9:53 AM     [facility] [address] [place] [address] [place],  [address] [phone]

## 2022-08-23 ENCOUNTER — Ambulatory Visit: Payer: Medicare HMO | Admitting: Physician Assistant

## 2022-08-25 ENCOUNTER — Encounter: Admission: RE | Admit: 2022-08-25 | Payer: Medicare HMO | Source: Ambulatory Visit

## 2022-08-28 NOTE — Progress Notes (Deleted)
Cardiology Office Note    Date:  08/28/2022   ID:  Donald Foster [birth year], MRN [id number]  PCP:  Bunnie Pion, FNP  Cardiologist:  Kate Sable, MD  Electrophysiologist:  None   Chief Complaint: Follow-up  History of Present Illness:   Donald Foster is a 77 y.o. male with history of coronary artery calcification noted on CT imaging in 04/2019, HTN, HLD, COPD, prior tobacco use of 40+ years, and renal dysfunction who presents for follow-up of echo.  Prior CT of the chest for lung cancer screening in 04/2019 showed coronary artery calcification in the LAD and RCA as well as aortic atherosclerosis along the aortic arch.  He was evaluated as a new patient on 08/16/2022 noting a several month history of progressive exertional dyspnea without frank angina.  Given symptoms, coronary CTA was recommended, however pre-CTA renal function showed renal dysfunction with a serum creatinine of 2.03.  In this setting, the patient was referred to nephrology and ischemic testing was transitioned to [facility] MPI which remains pending at this time.  Echo on 08/18/2022 showed an EF of 55 to 60%, no regional wall motion abnormalities, grade 1 diastolic dysfunction, normal RV systolic function and ventricular cavity size, mild aortic insufficiency, mild dilatation of the aortic root measuring 40 mm, and an estimated right atrial pressure of 3 mmHg.  ***   Labs independently reviewed: 08/2022 - potassium 4.0, BUN 24, serum creatinine 2.03 04/2020 - albumin 3.7, AST/ALT normal, Hgb 11.9, PLT 191   Past Medical History:  Diagnosis Date   AKI (acute kidney injury) ([place]) 01/14/2020   BPH (benign prostatic hyperplasia)    COPD (chronic obstructive pulmonary disease) (HCC)    Family history of cancer    GERD (gastroesophageal reflux disease)    Hypercholesteremia    Hypertension    Personal history of colonic polyps     Past Surgical History:  Procedure Laterality Date   CHOLECYSTECTOMY      COLONOSCOPY WITH PROPOFOL N/A 08/17/2018   Procedure: COLONOSCOPY WITH PROPOFOL;  Surgeon: Lin Landsman, MD;  Location: [facility] ENDOSCOPY;  Service: Gastroenterology;  Laterality: N/A;   COLONOSCOPY WITH PROPOFOL N/A 08/04/2021   Procedure: COLONOSCOPY WITH PROPOFOL;  Surgeon: Lin Landsman, MD;  Location: [facility] ENDOSCOPY;  Service: Gastroenterology;  Laterality: N/A;   ESOPHAGOGASTRODUODENOSCOPY (EGD) WITH PROPOFOL N/A 08/01/2018   Procedure: ESOPHAGOGASTRODUODENOSCOPY (EGD) WITH PROPOFOL;  Surgeon: Lin Landsman, MD;  Location: [place];  Service: Gastroenterology;  Laterality: N/A;   HEMORRHOID SURGERY N/A 02/22/2019   Procedure: HEMORRHOIDECTOMY;  Surgeon: Jules Husbands, MD;  Location: [facility] ORS;  Service: General;  Laterality: N/A;   HOLEP-LASER ENUCLEATION OF THE PROSTATE WITH MORCELLATION N/A 02/18/2020   Procedure: HOLEP-LASER ENUCLEATION OF THE PROSTATE WITH MORCELLATION;  Surgeon: Hollice Espy, MD;  Location: [facility] ORS;  Service: Urology;  Laterality: N/A;   UPPER GI ENDOSCOPY  08/17/2018   Procedure: UPPER GI ENDOSCOPY;  Surgeon: Lin Landsman, MD;  Location: [facility] ENDOSCOPY;  Service: Gastroenterology;;    Current Medications: No outpatient medications have been marked as taking for the 08/31/22 encounter (Appointment) with Rise Mu, PA-C.    Allergies:   Patient has no known allergies.   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  Tobacco Use   Smoking status: Former    Packs/day: 0.75    Years: 57.00    Total pack years: 42.75    Types: Cigarettes    Start date: 03/12/2018    Quit date: 2020    Years since quitting: 4.1   Smokeless tobacco: Never  Vaping Use   Vaping Use: Former  Substance and  Sexual Activity   Alcohol use: Yes    Comment: occasional   Drug use: Not Currently   Sexual activity: Not on file  Other Topics Concern   Not on file  Social History Narrative   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  ROS:   12-point review of systems is negative unless otherwise noted in HPI.   EKGs/Labs/Other Studies Reviewed:    Studies reviewed were summarized above. The additional studies were reviewed today:  2D echo 08/18/2022: 1. Left ventricular ejection fraction, by estimation, is 55 to 60%. The  left ventricle has normal function. The left ventricle has no regional  wall motion abnormalities. Left ventricular diastolic parameters are  consistent with Grade I diastolic  dysfunction (impaired relaxation). The average left ventricular global  longitudinal strain is -13.0 %. The global longitudinal strain is  abnormal.   2. Right ventricular systolic function is normal. The right ventricular  size is normal.   3. The mitral valve is normal in structure. No evidence of mitral valve  regurgitation.   4. The aortic valve is tricuspid. Aortic valve regurgitation is mild.   5. Aortic dilatation noted. There is mild dilatation of the aortic root,  measuring 40 mm.   6. The inferior vena cava is normal in size with greater than 50%  respiratory variability, suggesting right atrial pressure of 3 mmHg.  __________  [facility] MPI: Scheduled for 09/02/2022   EKG:  EKG is ordered today.  The EKG ordered today demonstrates ***  Recent Labs: 08/16/2022: BUN 24; Creatinine, Ser 2.03; Potassium 4.0; Sodium 139  Recent Lipid Panel No results found for: "CHOL", "TRIG", "HDL", "CHOLHDL",  "VLDL", "LDLCALC", "LDLDIRECT"  PHYSICAL EXAM:    VS:  There were no vitals taken for this visit.  BMI: There is no height or weight on file to calculate BMI.  Physical Exam  Wt Readings from Last 3 Encounters:  08/16/22 140 lb 2 oz (63.6 kg)  08/04/21 135 lb (61.2 kg)  09/30/20 139 lb (63 kg)     ASSESSMENT & PLAN:   ***  Coronary artery calcification/aortic atherosclerosis/HLD:  HTN: Blood pressure   {Are you ordering a CV Procedure (e.g. stress test, cath, DCCV, TEE, etc)?   Press F2        :[id number]     Disposition: F/u with Dr. Garen Lah or an APP in ***.   Medication Adjustments/Labs and Tests Ordered: Current medicines are reviewed at length with the patient today.  Concerns regarding medicines are outlined above. Medication changes, Labs and Tests ordered today are summarized above and listed in the Patient Instructions accessible in Encounters.   Signed, Christell Faith, PA-C 08/28/2022 11:57 AM     [address]  [place], [place] [address] [phone]

## 2022-08-31 ENCOUNTER — Ambulatory Visit: Payer: Medicare HMO | Attending: Physician Assistant | Admitting: Physician Assistant

## 2022-08-31 DIAGNOSIS — I700 Atherosclerosis of aorta: Secondary | ICD-10-CM

## 2022-08-31 DIAGNOSIS — I10 Essential (primary) hypertension: Secondary | ICD-10-CM

## 2022-08-31 DIAGNOSIS — E785 Hyperlipidemia, unspecified: Secondary | ICD-10-CM

## 2022-08-31 DIAGNOSIS — I2510 Atherosclerotic heart disease of native coronary artery without angina pectoris: Secondary | ICD-10-CM

## 2022-09-01 ENCOUNTER — Encounter: Payer: Self-pay | Admitting: Physician Assistant

## 2022-09-02 ENCOUNTER — Ambulatory Visit: Payer: Medicare HMO

## 2022-09-02 ENCOUNTER — Encounter
Admission: RE | Admit: 2022-09-02 | Discharge: 2022-09-02 | Disposition: A | Discharge status: Home / Self Care | Payer: Medicare HMO | Source: Ambulatory Visit | Attending: Cardiology | Admitting: Cardiology

## 2022-09-02 DIAGNOSIS — R079 Chest pain, unspecified: Secondary | ICD-10-CM | POA: Diagnosis not present

## 2022-09-02 MED ORDER — TECHNETIUM TC 99M TETROFOSMIN IV KIT
31.1500 | PACK | Freq: Once | INTRAVENOUS | Status: AC | PRN
  Administered 2022-09-02: 31.15 via INTRAVENOUS

## 2022-09-02 MED ORDER — REGADENOSON 0.4 MG/5ML IV SOLN
0.4000 mg | Freq: Once | INTRAVENOUS | Status: AC
  Administered 2022-09-02: 0.4 mg via INTRAVENOUS

## 2022-09-02 MED ORDER — TECHNETIUM TC 99M TETROFOSMIN IV KIT
10.0000 | PACK | Freq: Once | INTRAVENOUS | Status: AC | PRN
  Administered 2022-09-02: 10.26 via INTRAVENOUS

## 2022-09-03 LAB — NM MYOCAR MULTI W/SPECT W/WALL MOTION / EF
Estimated workload: 1
Exercise duration (min): 0 min
Exercise duration (sec): 0 s
LV dias vol: 96 mL (ref 62–150)
LV sys vol: 43 mL
MPHR: 146 {beats}/min
Nuc Stress EF: 55 %
Peak HR: 96 {beats}/min
Percent HR: 65 %
Rest HR: 77 {beats}/min
Rest Nuclear Isotope Dose: 10.3 mCi
SDS: 3
SRS: 9
SSS: 3
ST Depression (mm): 0 mm
Stress Nuclear Isotope Dose: 31.2 mCi
TID: 0.93

## 2022-09-10 ENCOUNTER — Ambulatory Visit: Payer: Medicare HMO | Attending: Physician Assistant | Admitting: Physician Assistant

## 2022-09-10 ENCOUNTER — Other Ambulatory Visit
Admission: RE | Admit: 2022-09-10 | Discharge: 2022-09-10 | Disposition: A | Discharge status: Home / Self Care | Payer: Medicare HMO | Attending: Physician Assistant | Admitting: Physician Assistant

## 2022-09-10 ENCOUNTER — Encounter: Payer: Self-pay | Admitting: Physician Assistant

## 2022-09-10 VITALS — BP 170/80 | HR 83 | Ht 69.0 in | Wt 142.4 lb

## 2022-09-10 DIAGNOSIS — I10 Essential (primary) hypertension: Secondary | ICD-10-CM

## 2022-09-10 DIAGNOSIS — I2510 Atherosclerotic heart disease of native coronary artery without angina pectoris: Secondary | ICD-10-CM | POA: Insufficient documentation

## 2022-09-10 DIAGNOSIS — I700 Atherosclerosis of aorta: Secondary | ICD-10-CM | POA: Insufficient documentation

## 2022-09-10 DIAGNOSIS — I2584 Coronary atherosclerosis due to calcified coronary lesion: Secondary | ICD-10-CM | POA: Insufficient documentation

## 2022-09-10 DIAGNOSIS — E785 Hyperlipidemia, unspecified: Secondary | ICD-10-CM | POA: Insufficient documentation

## 2022-09-10 DIAGNOSIS — R0602 Shortness of breath: Secondary | ICD-10-CM | POA: Diagnosis present

## 2022-09-10 DIAGNOSIS — J449 Chronic obstructive pulmonary disease, unspecified: Secondary | ICD-10-CM

## 2022-09-10 LAB — LIPID PANEL
Cholesterol: 103 mg/dL (ref 0–200)
HDL: 31 mg/dL — ABNORMAL LOW (ref >40)
LDL Cholesterol: 58 mg/dL (ref 0–99)
Total CHOL/HDL Ratio: 3.3 RATIO
Triglycerides: 68 mg/dL (ref <150)
VLDL: 14 mg/dL (ref 0–40)

## 2022-09-10 LAB — HEPATIC FUNCTION PANEL
ALT: 10 U/L (ref 0–44)
AST: 17 U/L (ref 15–41)
Albumin: 4.2 g/dL (ref 3.5–5.0)
Alkaline Phosphatase: 53 U/L (ref 38–126)
Bilirubin, Direct: <0.1 mg/dL (ref 0.0–0.2)
Total Bilirubin: 0.5 mg/dL (ref 0.3–1.2)
Total Protein: 7.8 g/dL (ref 6.5–8.1)

## 2022-09-10 LAB — LDL CHOLESTEROL, DIRECT: Direct LDL: 57 mg/dL (ref 0–99)

## 2022-09-10 MED ORDER — CARVEDILOL 6.25 MG PO TABS: 6.2500 mg | ORAL_TABLET | Freq: Two times a day (BID) | ORAL | 3 refills | Status: AC

## 2022-09-10 NOTE — Patient Instructions (Signed)
Medication Instructions:  Your physician has recommended you make the following change in your medication:   START Carvedilol 6.25 mg twice a day  *If you need a refill on your cardiac medications before your next appointment, please call your pharmacy*   Lab Work: Lipid, LFT, and direct LDL  If you have labs (blood work) drawn today and your tests are completely normal, you will receive your results only by: [place] (if you have MyChart) OR A paper copy in the mail If you have any lab test that is abnormal or we need to change your treatment, we will call you to review the results.   Testing/Procedures: None   Follow-Up: At [facility], you and your health needs are our priority.  As part of our continuing mission to provide you with exceptional heart care, we have created designated Provider Care Teams.  These Care Teams include your primary Cardiologist (physician) and Advanced Practice Providers (APPs -  Physician Assistants and Nurse Practitioners) who all work together to provide you with the care you need, when you need it.  We recommend signing up for the patient portal called "MyChart".  Sign up information is provided on this After Visit Summary.  MyChart is used to connect with patients for Virtual Visits (Telemedicine).  Patients are able to view lab/test results, encounter notes, upcoming appointments, etc.  Non-urgent messages can be sent to your provider as well.   To learn more about what you can do with MyChart, go to [email or web address].    Your next appointment:   2 month(s)  Provider:   Kate Sable, MD or Christell Faith, PA-C    Other Instructions Referral placed for you to see Pulmonary. Their number is [phone]

## 2022-09-10 NOTE — Progress Notes (Signed)
Cardiology Office Note    Date:  09/10/2022   ID:  Larsen, Collister [birth year], MRN [id number]  PCP:  Bunnie Pion, FNP  Cardiologist:  Kate Sable, MD  Electrophysiologist:  None   Chief Complaint: Follow up  History of Present Illness:   Donald Foster is a 77 y.o. male with history of coronary artery calcification noted on CT imaging in 04/2019, HTN, HLD, COPD, prior tobacco use of 40+ years, and renal dysfunction who presents for follow-up of echo and Lexiscan MPI.  Prior CT of the chest for lung cancer screening in 04/2019 showed coronary artery calcification in the LAD and RCA as well as aortic atherosclerosis along the aortic arch.  He was evaluated as a new patient on 08/16/2022 noting a several month history of progressive exertional dyspnea without frank angina.  Given symptoms, coronary CTA was recommended, however pre-CTA renal function showed renal dysfunction with a serum creatinine of 2.03.  In this setting, the patient was referred to nephrology and ischemic testing was transitioned to [facility] MPI.  Echo on 08/18/2022 showed an EF of 55 to 60%, no regional wall motion abnormalities, grade 1 diastolic dysfunction, normal RV systolic function and ventricular cavity size, mild aortic insufficiency, mild dilatation of the aortic root measuring 40 mm, and an estimated right atrial pressure of 3 mmHg.  Lexiscan MPI on 2/23/204 showed no significant ischemia with an EF of 55%.  CT attenuated regimen images showed moderate aortic atherosclerosis with mild coronary artery calcification in the LAD and RCA.  Overall, this was a low risk scan.  He comes in today and is without symptoms of angina or cardiac decompensation.  He notes a long history of cough and shortness of breath.  He has historically attributed this to COPD with a greater than 40-year pack history, quitting tobacco use approximately 4 to 5 years prior.  He does note some mild chest discomfort, though these are  associated with coughing episodes.  No significant lower extremity swelling, abdominal distention, or orthopnea.  He does not have a pulmonologist.  He does have a home BP monitor though has not been checking his readings recently.  Query of epic shows readings are largely in the 0000000 to [id number] systolic.   Labs independently reviewed: 08/2022 - potassium 4.0, BUN 24, serum creatinine 2.03 04/2020 - albumin 3.7, AST/ALT normal, Hgb 11.9, PLT 191   Past Medical History:  Diagnosis Date   AKI (acute kidney injury) ([place]) 01/14/2020   BPH (benign prostatic hyperplasia)    COPD (chronic obstructive pulmonary disease) (HCC)    Family history of cancer    Genetic testing 09/22/2021   GERD (gastroesophageal reflux disease)    Hypercholesteremia    Hypertension    Personal history of colonic polyps     Past Surgical History:  Procedure Laterality Date   CHOLECYSTECTOMY     COLONOSCOPY WITH PROPOFOL N/A 08/17/2018   Procedure: COLONOSCOPY WITH PROPOFOL;  Surgeon: Lin Landsman, MD;  Location: [facility] ENDOSCOPY;  Service: Gastroenterology;  Laterality: N/A;   COLONOSCOPY WITH PROPOFOL N/A 08/04/2021   Procedure: COLONOSCOPY WITH PROPOFOL;  Surgeon: Lin Landsman, MD;  Location: [facility] ENDOSCOPY;  Service: Gastroenterology;  Laterality: N/A;   ESOPHAGOGASTRODUODENOSCOPY (EGD) WITH PROPOFOL N/A 08/01/2018   Procedure: ESOPHAGOGASTRODUODENOSCOPY (EGD) WITH PROPOFOL;  Surgeon: Lin Landsman, MD;  Location: [facility];  Service: Gastroenterology;  Laterality: N/A;   HEMORRHOID SURGERY N/A 02/22/2019   Procedure: HEMORRHOIDECTOMY;  Surgeon: Jules Husbands, MD;  Location: [facility] ORS;  Service: General;  Laterality: N/A;   HOLEP-LASER ENUCLEATION OF THE PROSTATE WITH MORCELLATION N/A 02/18/2020   Procedure: HOLEP-LASER ENUCLEATION OF THE PROSTATE WITH MORCELLATION;  Surgeon: Hollice Espy, MD;  Location: [facility] ORS;  Service: Urology;  Laterality: N/A;   UPPER GI ENDOSCOPY  08/17/2018   Procedure:  UPPER GI ENDOSCOPY;  Surgeon: Lin Landsman, MD;  Location: [facility] ENDOSCOPY;  Service: Gastroenterology;;    Current Medications: Current Meds  Medication Sig   albuterol (VENTOLIN HFA) 108 (90 Base) MCG/ACT inhaler Inhale 2 puffs into the lungs every 4 (four) hours as needed for wheezing or shortness of breath. Every 4-6 hrs prn   amLODipine (NORVASC) 10 MG tablet Take 10 mg by mouth daily.    atorvastatin (LIPITOR) 20 MG tablet Take 20 mg by mouth at bedtime.    carvedilol (COREG) 6.25 MG tablet Take 1 tablet (6.25 mg total) by mouth 2 (two) times daily.   D-5000 125 MCG (5000 UT) TABS Take 1 tablet by mouth daily.   Fluticasone-Salmeterol (ADVAIR) 100-50 MCG/DOSE AEPB Inhale 1 puff into the lungs 2 (two) times daily.   losartan (COZAAR) 25 MG tablet Take 1 tablet (25 mg) by mouth once daily   omeprazole (PRILOSEC) 40 MG capsule Take 1 tablet (40 mg) by mouth once daily   SPIRIVA HANDIHALER 18 MCG inhalation capsule Place 18 mcg into inhaler and inhale daily.    SYMBICORT 160-4.5 MCG/ACT inhaler Inhale into the lungs daily.    Allergies:   Patient has no known allergies.   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  Tobacco Use   Smoking status: Former    Packs/day: 0.75    Years: 57.00    Total pack years: 42.75    Types: Cigarettes    Start date: 03/12/2018    Quit date: 2020    Years since quitting: 4.1   Smokeless tobacco: Never  Vaping Use   Vaping Use: Former  Substance and Sexual Activity   Alcohol use: Yes    Comment: occasional   Drug use: Not Currently   Sexual activity: Not on file  Other Topics Concern   Not on file  Social History Narrative   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  ROS:   12-point review of systems is negative unless otherwise noted in the HPI.   EKGs/Labs/Other Studies Reviewed:    Studies reviewed were summarized above. The additional studies were reviewed today:  2D echo 08/18/2022: 1. Left ventricular ejection fraction, by estimation, is 55 to 60%. The  left ventricle has normal function. The left ventricle has no regional  wall motion abnormalities. Left ventricular diastolic parameters are  consistent with Grade I diastolic  dysfunction (impaired relaxation). The average left ventricular global  longitudinal strain is -13.0 %. The global longitudinal strain is  abnormal.   2. Right ventricular systolic function is normal. The  right ventricular  size is normal.   3. The mitral valve is normal in structure. No evidence of mitral valve  regurgitation.   4. The aortic valve is tricuspid. Aortic valve regurgitation is mild.   5. Aortic dilatation noted. There is mild dilatation of the aortic root,  measuring 40 mm.   6. The inferior vena cava is normal in size with greater than 50%  respiratory variability, suggesting right atrial pressure of 3 mmHg.  __________  [facility] MPI 09/03/2022: Pharmacological myocardial perfusion imaging study with no significant  ischemia Apical thinning noted consistent with attenuation artifact, significant GI uptake artifact noted Normal wall motion, EF estimated at 55% No EKG changes concerning for ischemia at peak stress or in recovery. CT attenuation correction images with moderate aortic atherosclerosis, mild coronary calcification proximal LAD and RCA Low risk scan   EKG:  EKG is not ordered today.    Recent  Labs: 08/16/2022: BUN 24; Creatinine, Ser 2.03; Potassium 4.0; Sodium 139  Recent Lipid Panel No results found for: "CHOL", "TRIG", "HDL", "CHOLHDL", "VLDL", "LDLCALC", "LDLDIRECT"  PHYSICAL EXAM:    VS:  BP (!) 170/80 (BP Location: Right Arm, Patient Position: Sitting, Cuff Size: Normal)   Pulse 83   Ht '5\' 9"'$  (1.753 m)   Wt 142 lb 6.4 oz (64.6 kg)   SpO2 97%   BMI 21.03 kg/m   BMI: Body mass index is 21.03 kg/m.  Physical Exam Vitals reviewed.  Constitutional:      Appearance: He is well-developed.  HENT:     Head: Normocephalic and atraumatic.  Eyes:     General:        Right eye: No discharge.        Left eye: No discharge.  Neck:     Vascular: No JVD.  Cardiovascular:     Rate and Rhythm: Normal rate and regular rhythm.     Heart sounds: Normal heart sounds, S1 normal and S2 normal. Heart sounds not distant. No midsystolic click and no opening snap. No murmur heard.    No friction rub.  Pulmonary:     Effort: Pulmonary effort is normal. No respiratory distress.     Breath sounds: Normal breath sounds. No decreased breath sounds, wheezing or rales.  Chest:     Chest wall: No tenderness.  Abdominal:     General: There is no distension.  Musculoskeletal:     Cervical back: Normal range of motion.     Right lower leg: No edema.     Left lower leg: No edema.  Skin:    General: Skin is warm and dry.     Nails: There is no clubbing.  Neurological:     Mental Status: He is alert and oriented to person, place, and time.  Psychiatric:        Speech: Speech normal.        Behavior: Behavior normal.        Thought Content: Thought content normal.        Judgment: Judgment normal.     Wt Readings from Last 3 Encounters:  09/10/22 142 lb 6.4 oz (64.6 kg)  08/16/22 140 lb 2 oz (63.6 kg)  08/04/21 135 lb (61.2 kg)     ASSESSMENT & PLAN:   Dyspnea with cough/COPD: Cardiac workup reassuring including echo and Lexiscan MPI.  He does have a long history of COPD with  extensive tobacco use.  I do wonder if his symptoms are more pulmonary in etiology.  Refer to pulmonology  for ongoing workup.  Coronary artery calcification/aortic atherosclerosis/HLD: No symptoms suggestive of angina.  Suspect his shortness of breath is more pulmonary in etiology at this time.  Recommend aggressive risk factor modification and primary prevention.  Would benefit from the addition of aspirin.  He remains on atorvastatin 20 mg.  Check LFT, lipid panel, and direct LDL.  HTN: Blood pressure is suboptimally controlled.  Add carvedilol 6.25 mg twice daily.  He remains on amlodipine 10 mg and low-dose losartan.  Low-sodium diet recommended.  Renal dysfunction: Primary cardiologist has referred the patient to nephrology.  For now, remains on ARB for nephro protection.  Ongoing management deferred to PCP/nephrology.   Disposition: F/u with Dr. Garen Lah or an APP in 2 months.   Medication Adjustments/Labs and Tests Ordered: Current medicines are reviewed at length with the patient today.  Concerns regarding medicines are outlined above. Medication changes, Labs and Tests ordered today are summarized above and listed in the Patient Instructions accessible in Encounters.   Signed, Christell Faith, PA-C 09/10/2022 9:06 AM     [facility] [address] [place] [address] [place], [place] [address] [phone]

## 2022-09-17 ENCOUNTER — Encounter: Payer: Self-pay | Admitting: Internal Medicine

## 2022-09-17 ENCOUNTER — Ambulatory Visit
Admission: RE | Admit: 2022-09-17 | Discharge: 2022-09-17 | Disposition: A | Discharge status: Home / Self Care | Payer: Medicare HMO | Source: Ambulatory Visit | Attending: Internal Medicine | Admitting: Internal Medicine

## 2022-09-17 ENCOUNTER — Ambulatory Visit (INDEPENDENT_AMBULATORY_CARE_PROVIDER_SITE_OTHER): Payer: Medicare HMO | Admitting: Internal Medicine

## 2022-09-17 ENCOUNTER — Ambulatory Visit
Admission: RE | Admit: 2022-09-17 | Discharge: 2022-09-17 | Disposition: A | Discharge status: Home / Self Care | Payer: Medicare HMO | Attending: Internal Medicine | Admitting: Internal Medicine

## 2022-09-17 VITALS — BP 132/80 | HR 85 | Temp 98.0°F | Ht 69.0 in | Wt 143.0 lb

## 2022-09-17 DIAGNOSIS — J4489 Other specified chronic obstructive pulmonary disease: Secondary | ICD-10-CM | POA: Diagnosis present

## 2022-09-17 DIAGNOSIS — Z87891 Personal history of nicotine dependence: Secondary | ICD-10-CM

## 2022-09-17 MED ORDER — BREZTRI AEROSPHERE 160-9-4.8 MCG/ACT IN AERO: INHALATION_SPRAY | RESPIRATORY_TRACT | 11 refills | Status: DC

## 2022-09-17 MED ORDER — BREZTRI AEROSPHERE 160-9-4.8 MCG/ACT IN AERO: 2.0000 | INHALATION_SPRAY | Freq: Two times a day (BID) | RESPIRATORY_TRACT | 0 refills | Status: DC

## 2022-09-17 NOTE — Patient Instructions (Signed)
Plan A = Automatic = Always=    Breztri Take 2 puffs first thing in am and then another 2 puffs about 12 hours later.    Work on inhaler technique:  relax and gently blow all the way out then take a nice smooth full deep breath back in, triggering the inhaler at same time you start breathing in.  Hold breath in for at least  5 seconds if you can. Blow out breztri  thru nose. Rinse and gargle with water when done.  If mouth or throat bother you at all,  try brushing teeth/gums/tongue with arm and hammer toothpaste/ make a slurry and gargle and spit out.     Plan B = Backup (to supplement plan A, not to replace it) Only use your albuterol inhaler as a rescue medication to be used if you can't catch your breath by resting or doing a relaxed purse lip breathing pattern.  - The less you use it, the better it will work when you need it. - Ok to use the inhaler up to 2 puffs  every 4 hours if you must but call for appointment if use goes up over your usual need - Don't leave home without it !!  (think of it like start fluid)   Also  Ok to try albuterol 15 min before an activity (on alternating days)  that you know would usually make you short of breath and see if it makes any difference and if makes none then don't take albuterol after activity unless you can't catch your breath as this means it's the resting that helps, not the albuterol.       Please remember to go to the  x-ray department  for your tests - we will call you with the results when they are available    Please schedule a follow up office visit in 6 weeks, call sooner if needed with all medications /inhalers/ solutions in hand so we can verify exactly what you are taking. This includes all medications from all doctors and over the counters

## 2022-09-17 NOTE — Assessment & Plan Note (Addendum)
Quit smoking 2020 with mild/mod clubbing on exam 09/17/2022  - 09/17/2022  After extensive coaching inhaler device,  effectiveness =    75% > try change spiriva dpi/symbicort to breztri 2bid and approp saba - 09/17/2022   Walked on RA  x  3  lap(s) =  approx 525  ft  @ avg pace, stopped due to end of study s sob  with lowest 02 sats 95%    Group D (now reclassified as E) in terms of symptom/risk and laba/lama/ICS  therefore appropriate rx at this point >>>  Breztri and approp saba;  Re SABA :  I spent extra time with pt today reviewing appropriate use of albuterol for prn use on exertion with the following points: 1) saba is for relief of sob that does not improve by walking a slower pace or resting but rather if the pt does not improve after trying this first. 2) If the pt is convinced, as many are, that saba helps recover from activity faster then it's easy to tell if this is the case by re-challenging : ie stop, take the inhaler, then p 5 minutes try the exact same activity (intensity of workload) that just caused the symptoms and see if they are substantially diminished or not after saba 3) if there is an activity that reproducibly causes the symptoms, try the saba 15 min before the activity on alternate days   If in fact the saba really does help, then fine to continue to use it prn but advised may need to look closer at the maintenance regimen being used to achieve better control of airways disease with exertion.  F/u with pfts in 6 weeks

## 2022-09-17 NOTE — Assessment & Plan Note (Signed)
Referred for lung cancer screening 09/17/2022    Low-dose CT lung cancer screening is recommended for patients who are 49-75 years of age with a 20+ pack-year history of smoking and who are currently smoking or quit <=15 years ago. No coughing up blood  No unintentional weight loss of > 15 pounds in the last 6 months - pt is eligible for scanning yearly until age 47     Discussed in detail all the  indications, usual  risks and alternatives  relative to the benefits with patient who agrees to proceed with w/u >>> referred for shared decision making         Each maintenance medication was reviewed in detail including emphasizing most importantly the difference between maintenance and prns and under what circumstances the prns are to be triggered using an action plan format where appropriate.  Total time for H and P, chart review, counseling, reviewing hfa device(s) , directly observing portions of ambulatory 02 saturation study/ and generating customized AVS unique to this office visit / same day charting = 45 min

## 2022-09-17 NOTE — Progress Notes (Signed)
Donald Foster, male    DOB: [birth year]   MRN: [id number]   Brief patient profile:  12  yobm   quit smoking 2020 with onset of cough/ wheeze  referred to pulmonary clinic in [facility]  09/17/2022 by Donald Milo NP for copd eval    History of Present Illness  09/17/2022  Pulmonary/ 1st office eval/ Donald Foster / [name]/ spiriva not helping  Chief Complaint  Patient presents with   Consult    DOE for a year. Some wheezing. Cough with white sputum. Has been told he has COPD from PCP.   Dyspnea:  slower pace [employer] might be too much for him  Cough: esp in am/ slt rattle > white mucus only   Sleep: bed is flat / a couple of pillows s resp cc  SABA use: not using   No obvious day to day or daytime pattern/variability or assoc excess/ purulent sputum or mucus plugs or hemoptysis or cp or chest tightness,  or overt sinus or hb symptoms.   Sleeping  without nocturnal  or early am exacerbation  of respiratory  c/o's or need for noct saba. Also denies any obvious fluctuation of symptoms with weather or environmental changes or other aggravating or alleviating factors except as outlined above   No unusual exposure hx or h/o childhood pna/ asthma or knowledge of premature birth.  Current Allergies, Complete Past Medical History, Past Surgical History, Family History, and Social History were reviewed in [employer] record.  ROS  The following are not active complaints unless bolded Hoarseness, sore throat, dysphagia, dental problems, itching, sneezing,  nasal congestion or discharge of excess mucus or purulent secretions, ear ache,   fever, chills, sweats, unintended wt loss or wt gain, classically pleuritic or exertional cp,  orthopnea pnd or arm/hand swelling  or leg swelling, presyncope, palpitations, abdominal pain, anorexia, nausea, vomiting, diarrhea  or change in bowel habits or change in bladder habits, change in stools or change in urine, dysuria,  hematuria,  rash, arthralgias, visual complaints, headache, numbness, weakness or ataxia or problems with walking or coordination,  change in mood or  memory.           Past Medical History:  Diagnosis Date   AKI (acute kidney injury) ([place]) 01/14/2020   BPH (benign prostatic hyperplasia)    COPD (chronic obstructive pulmonary disease) (HCC)    Family history of cancer    Genetic testing 09/22/2021   GERD (gastroesophageal reflux disease)    Hypercholesteremia    Hypertension    Personal history of colonic polyps     Outpatient Medications Prior to Visit  Medication Sig Dispense Refill   albuterol (VENTOLIN HFA) 108 (90 Base) MCG/ACT inhaler Inhale 2 puffs into the lungs every 4 (four) hours as needed for wheezing or shortness of breath. Every 4-6 hrs prn     aluminum hydroxide-magnesium carbonate (GAVISCON) 95-358 MG/15ML SUSP Take 15 mLs by mouth 4 (four) times daily - after meals and at bedtime. (Patient not taking: Reported on 09/10/2022) 1 Bottle 2   amLODipine (NORVASC) 10 MG tablet Take 10 mg by mouth daily.      atorvastatin (LIPITOR) 20 MG tablet Take 20 mg by mouth at bedtime.      carvedilol (COREG) 6.25 MG tablet Take 1 tablet (6.25 mg total) by mouth 2 (two) times daily. 180 tablet 3   D-5000 125 MCG (5000 UT) TABS Take 1 tablet by mouth daily.  losartan (COZAAR) 25 MG tablet Take 1 tablet (25 mg) by mouth once daily     omeprazole (PRILOSEC) 40 MG capsule Take 1 tablet (40 mg) by mouth once daily     SPIRIVA HANDIHALER 18 MCG inhalation capsule Place 18 mcg into inhaler and inhale daily.      SYMBICORT 160-4.5 MCG/ACT inhaler Inhale into the lungs daily.        Objective:     BP 132/80 (BP Location: Left Arm, Cuff Size: Normal)   Pulse 85   Temp 98 F (36.7 C)   Ht '5\' 9"'$  (1.753 m)   Wt 143 lb (64.9 kg)   SpO2 96%   BMI 21.12 kg/m   SpO2: 96 %  Amb hoarse bm nad    HEENT : Oropharynx  clear/ full dentures   Nasal turbinates nl    NECK :   without  apparent JVD/ palpable Nodes/TM    LUNGS: no acc muscle use,  Min barrel  contour chest wall with bilateral  slightly decreased bs s audible wheeze and  without cough on insp or exp maneuvers and min  Hyperresonant  to  percussion bilaterally    CV:  RRR  no s3 or murmur or increase in P2, and no edema   ABD:  soft and nontender with pos end  insp Hoover's  in the supine position.  No bruits or organomegaly appreciated   MS:  Nl gait/ ext warm without deformities Or obvious joint restrictions  calf tenderness, cyanosis - mild to mod clubbing     SKIN: warm and dry without lesions    NEURO:  alert, approp, nl sensorium with  no motor or cerebellar deficits apparent.        CXR PA and Lateral:   09/17/2022 :    I personally reviewed images and impression is as follows:     Mild/mod copd with chronic slt elevation LHD/ no acute findings     Assessment   COPD with chronic bronchitis Quit smoking 2020 with mild/mod clubbing on exam 09/17/2022  - 09/17/2022  After extensive coaching inhaler device,  effectiveness =    75% > try change spiriva dpi/symbicort to breztri 2bid and approp saba - 09/17/2022   Walked on RA  x  3  lap(s) =  approx 525  ft  @ avg pace, stopped due to end of study s sob  with lowest 02 sats 95%    Group D (now reclassified as E) in terms of symptom/risk and laba/lama/ICS  therefore appropriate rx at this point >>>  Breztri and approp saba;  Re SABA :  I spent extra time with pt today reviewing appropriate use of albuterol for prn use on exertion with the following points: 1) saba is for relief of sob that does not improve by walking a slower pace or resting but rather if the pt does not improve after trying this first. 2) If the pt is convinced, as many are, that saba helps recover from activity faster then it's easy to tell if this is the case by re-challenging : ie stop, take the inhaler, then p 5 minutes try the exact same activity (intensity of workload) that  just caused the symptoms and see if they are substantially diminished or not after saba 3) if there is an activity that reproducibly causes the symptoms, try the saba 15 min before the activity on alternate days   If in fact the saba really does help, then fine to continue to  use it prn but advised may need to look closer at the maintenance regimen being used to achieve better control of airways disease with exertion.  F/u with pfts in 6 weeks    Former cigarette smoker Referred for lung cancer screening 09/17/2022    Low-dose CT lung cancer screening is recommended for patients who are 32-46 years of age with a 20+ pack-year history of smoking and who are currently smoking or quit <=15 years ago. No coughing up blood  No unintentional weight loss of > 15 pounds in the last 6 months - pt is eligible for scanning yearly until age 13     Discussed in detail all the  indications, usual  risks and alternatives  relative to the benefits with patient who agrees to proceed with w/u >>> referred for shared decision making         Each maintenance medication was reviewed in detail including emphasizing most importantly the difference between maintenance and prns and under what circumstances the prns are to be triggered using an action plan format where appropriate.  Total time for H and P, chart review, counseling, reviewing hfa device(s) , directly observing portions of ambulatory 02 saturation study/ and generating customized AVS unique to this office visit / same day charting = 45 min                   Christinia Gully, MD 09/17/2022

## 2022-10-28 NOTE — Progress Notes (Unsigned)
Donald Foster, male    DOB: May 03, 1946   MRN: [id number]   Brief patient profile:  77 yobm   quit smoking 2020 with onset of cough/ wheeze  referred to pulmonary clinic in [facility]  09/17/2022 by [name] for copd eval    History of Present Illness  09/17/2022  Pulmonary/ 1st office eval/ Rie Mcneil / [employer]  Office symbicort/ spiriva not helping  Chief Complaint  Patient presents with   Consult    DOE for a year. Some wheezing. Cough with white sputum. Has been told he has COPD from PCP.   Dyspnea:  slower pace [employer] might be too much for him  Cough: esp in am/ slt rattle > white mucus only   Sleep: bed is flat / a couple of pillows s resp cc  SABA use: not using Rec Plan A = Automatic = Always=    Breztri Take 2 puffs first thing in am and then another 2 puffs about 12 hours later.  Work on inhaler technique:  Plan B = Backup (to supplement plan A, not to replace it) Only use your albuterol inhaler as a rescue medication  Also  Ok to try albuterol 15 min before an activity (on alternating days)  that you know would usually make you short of breath   Please schedule a follow up office visit in 6 weeks, call sooner if needed with all medications /inhalers/ solutions in hand   10/29/2022  f/u ov/Elise Knobloch/ [facility] re: AB/ presumed copd    maint on Breztri  though hfa not optimal technique Chief Complaint  Patient presents with   Follow-up    Breathing is better! SOB with exertion. Some wheezing. Cough with white sputum.   Dyspnea:  work easier as Automotive engineer Cough: variable white  with subjective wheeze  Sleeping: flat bed, pillows x 2 - no noct resp cc  SABA use: not using  02: none  Lung cancer sreening  referred today    No obvious day to day or daytime variability or assoc excess/ purulent sputum or mucus plugs or hemoptysis or cp or chest tightness,   or overt sinus or hb symptoms.   Sleeping as above  without nocturnal  or early am  exacerbation  of respiratory  c/o's or need for noct saba. Also denies any obvious fluctuation of symptoms with weather or environmental changes or other aggravating or alleviating factors except as outlined above   No unusual exposure hx or h/o childhood pna/ asthma or knowledge of premature birth.  Current Allergies, Complete Past Medical History, Past Surgical History, Family History, and Social History were reviewed in [employer] record.  ROS  The following are not active complaints unless bolded Hoarseness, sore throat, dysphagia, dental problems, itching, sneezing,  nasal congestion or discharge of excess mucus or purulent secretions, ear ache,   fever, chills, sweats, unintended wt loss or wt gain, classically pleuritic or exertional cp,  orthopnea pnd or arm/hand swelling  or leg swelling, presyncope, palpitations, abdominal pain, anorexia, nausea, vomiting, diarrhea  or change in bowel habits or change in bladder habits, change in stools or change in urine, dysuria, hematuria,  rash, arthralgias, visual complaints, headache, numbness, weakness or ataxia or problems with walking or coordination,  change in mood or  memory.        Current Meds  Medication Sig   albuterol (VENTOLIN HFA) 108 (90 Base) MCG/ACT inhaler Inhale 2 puffs into the lungs every 4 (four)  hours as needed for wheezing or shortness of breath. Every 4-6 hrs prn   amLODipine (NORVASC) 10 MG tablet Take 10 mg by mouth daily.    atorvastatin (LIPITOR) 20 MG tablet Take 20 mg by mouth at bedtime.    Budeson-Glycopyrrol-Formoterol (BREZTRI AEROSPHERE) 160-9-4.8 MCG/ACT AERO Take 2 puffs first thing in am and then another 2 puffs about 12 hours later.   carvedilol (COREG) 6.25 MG tablet Take 1 tablet (6.25 mg total) by mouth 2 (two) times daily.   D-5000 125 MCG (5000 UT) TABS Take 1 tablet by mouth daily.   losartan (COZAAR) 25 MG tablet Take 1 tablet (25 mg) by mouth once daily   magnesium oxide  (MAG-OX) 400 MG tablet Take 400 mg by mouth daily.   omeprazole (PRILOSEC) 40 MG capsule Take 1 tablet (40 mg) by mouth once daily                    Past Medical History:  Diagnosis Date   AKI (acute kidney injury) (HCC) 01/14/2020   BPH (benign prostatic hyperplasia)    COPD (chronic obstructive pulmonary disease) (HCC)    Family history of cancer    Genetic testing 09/22/2021   GERD (gastroesophageal reflux disease)    Hypercholesteremia    Hypertension    Personal history of colonic polyps       Objective:    wts  10/29/2022       138  09/17/22 143 lb (64.9 kg)  09/10/22 142 lb 6.4 oz (64.6 kg)  08/16/22 140 lb 2 oz (63.6 kg)      Vital signs reviewed  10/29/2022  - Note at rest 02 sats  96% on RA   General appearance:    amb bm nad     HEENT : Oropharynx  clear     NECK :  without  apparent JVD/ palpable Nodes/TM    LUNGS: no acc muscle use,  Min barrel  contour chest wall with bilateral  slightly decreased bs s audible wheeze and  without cough on insp or exp maneuvers and min  Hyperresonant  to  percussion bilaterally    CV:  RRR  no s3 or murmur or increase in P2, and no edema   ABD:  soft and nontender with pos end  insp Hoover's  in the supine position.  No bruits or organomegaly appreciated   MS:  Nl gait/ ext warm without deformities Or obvious joint restrictions  calf tenderness, cyanosis  - mild clubbing    SKIN: warm and dry without lesions    NEURO:  alert, approp, nl sensorium with  no motor or cerebellar deficits apparent.              Assessment

## 2022-10-29 ENCOUNTER — Encounter: Payer: Self-pay | Admitting: Internal Medicine

## 2022-10-29 ENCOUNTER — Ambulatory Visit (INDEPENDENT_AMBULATORY_CARE_PROVIDER_SITE_OTHER): Payer: Medicare HMO | Admitting: Internal Medicine

## 2022-10-29 VITALS — BP 120/78 | HR 72 | Temp 97.5°F | Ht 69.0 in | Wt 138.4 lb

## 2022-10-29 DIAGNOSIS — J4489 Other specified chronic obstructive pulmonary disease: Secondary | ICD-10-CM | POA: Diagnosis not present

## 2022-10-29 DIAGNOSIS — Z87891 Personal history of nicotine dependence: Secondary | ICD-10-CM | POA: Diagnosis not present

## 2022-10-29 MED ORDER — BREZTRI AEROSPHERE 160-9-4.8 MCG/ACT IN AERO: 2.0000 | INHALATION_SPRAY | Freq: Two times a day (BID) | RESPIRATORY_TRACT | 0 refills | Status: DC

## 2022-10-29 NOTE — Assessment & Plan Note (Signed)
Low-dose CT lung cancer screening is recommended for patients who are 54-75 years of age with a 20+ pack-year history of smoking and who are currently smoking or quit <=15 years ago. No coughing up blood  No unintentional weight loss of > 15 pounds in the last 6 months - pt is eligible for scanning yearly until age 52 > referred to program

## 2022-10-29 NOTE — Patient Instructions (Signed)
My office will be contacting you by phone for referral to lung cancer screening program and PFTs - if you don't hear back from my office within one week please call us back or notify us thru MyChart and we'll address it right away.    Work on inhaler technique:  relax and gently blow all the way out then take a nice smooth full deep breath back in, triggering the inhaler at same time you start breathing in.  Hold breath in for at least  5 seconds if you can. Blow out breztri thru nose. Rinse and gargle with water when done.  If mouth or throat bother you at all,  try brushing teeth/gums/tongue with arm and hammer toothpaste/ make a slurry and gargle and spit out.  - remember how golfers warm up !   Please schedule a follow up visit in 6 months but call sooner if needed

## 2022-10-30 ENCOUNTER — Encounter: Payer: Self-pay | Admitting: Internal Medicine

## 2022-10-30 NOTE — Assessment & Plan Note (Addendum)
Quit smoking 2020 with mild/mod clubbing on exam 09/17/2022  - LDSCT  04/17/2019 Lungs/Pleura: Mild interlobular and paraseptal emphysematouschanges, upper lung predominant. Lower lobe bronchiectasis. Scarring/atelectasis in the bilateral lower lobes. Lingular scarring/atelectasis. - 09/17/2022  After extensive coaching inhaler device,  effectiveness =    75% > try change spiriva dpi/symbicort to breztri 2bid and approp saba - 09/17/2022   Walked on RA  x  3  lap(s) =  approx 525  ft  @ avg pace, stopped due to end of study s sob  with lowest 02 sats 95%   >> 10/29/2022  After extensive coaching inhaler device,  effectiveness =    70%  Group D (now reclassified as E) in terms of symptom/risk and laba/lama/ICS  therefore appropriate rx at this point >>>  breztri 2bid and continue approp saba   Dx of bronchiectasis also reviewed > really no need to change rx though may lower threshold for abx in event of purulent cough

## 2022-11-11 ENCOUNTER — Encounter: Payer: Self-pay | Admitting: Cardiology

## 2022-11-11 ENCOUNTER — Ambulatory Visit: Payer: Medicare HMO | Attending: Cardiology | Admitting: Cardiology

## 2022-11-11 VITALS — BP 138/68 | HR 80 | Ht 67.0 in | Wt 137.2 lb

## 2022-11-11 DIAGNOSIS — E7800 Pure hypercholesterolemia, unspecified: Secondary | ICD-10-CM | POA: Diagnosis not present

## 2022-11-11 DIAGNOSIS — N189 Chronic kidney disease, unspecified: Secondary | ICD-10-CM

## 2022-11-11 DIAGNOSIS — I10 Essential (primary) hypertension: Secondary | ICD-10-CM

## 2022-11-11 DIAGNOSIS — R0609 Other forms of dyspnea: Secondary | ICD-10-CM | POA: Diagnosis not present

## 2022-11-11 NOTE — Progress Notes (Signed)
Cardiology Office Note:    Date:  11/11/2022   ID:  Hilliard, Borges 31-Oct-1945, MRN [id number]  PCP:  Lorn Junes, FNP   [facility] Providers Cardiologist:  Debbe Odea, MD     Referring MD: Lorn Junes, FNP   No chief complaint on file.   History of Present Illness:    Donald Foster is a 77 y.o. male with a hx of hypertension, hyperlipidemia, former smoker x 40+ years, CKD, COPD who presents for follow-up.  Previously seen due to shortness of breath.  Echocardiogram and Lexiscan Myoview was obtained to evaluate any significant ischemia.  He states overall his shortness of breath is improved.  Noted to have CKD, previously referred to nephrology.  Has not had appointment yet.  Presents for testing results, no new concerns today.   Past Medical History:  Diagnosis Date   AKI (acute kidney injury) (HCC) 01/14/2020   BPH (benign prostatic hyperplasia)    COPD (chronic obstructive pulmonary disease) (HCC)    Family history of cancer    Genetic testing 09/22/2021   GERD (gastroesophageal reflux disease)    Hypercholesteremia    Hypertension    Personal history of colonic polyps     Past Surgical History:  Procedure Laterality Date   CHOLECYSTECTOMY     COLONOSCOPY WITH PROPOFOL N/A 08/17/2018   Procedure: COLONOSCOPY WITH PROPOFOL;  Surgeon: Toney Reil, MD;  Location: [facility] ENDOSCOPY;  Service: Gastroenterology;  Laterality: N/A;   COLONOSCOPY WITH PROPOFOL N/A 08/04/2021   Procedure: COLONOSCOPY WITH PROPOFOL;  Surgeon: Toney Reil, MD;  Location: [facility] ENDOSCOPY;  Service: Gastroenterology;  Laterality: N/A;   ESOPHAGOGASTRODUODENOSCOPY (EGD) WITH PROPOFOL N/A 08/01/2018   Procedure: ESOPHAGOGASTRODUODENOSCOPY (EGD) WITH PROPOFOL;  Surgeon: Toney Reil, MD;  Location: [facility] ENDOSCOPY;  Service: Gastroenterology;  Laterality: N/A;   HEMORRHOID SURGERY N/A 02/22/2019   Procedure: HEMORRHOIDECTOMY;  Surgeon: Leafy Ro,  MD;  Location: [facility] ORS;  Service: General;  Laterality: N/A;   HOLEP-LASER ENUCLEATION OF THE PROSTATE WITH MORCELLATION N/A 02/18/2020   Procedure: HOLEP-LASER ENUCLEATION OF THE PROSTATE WITH MORCELLATION;  Surgeon: Vanna Scotland, MD;  Location: [facility] ORS;  Service: Urology;  Laterality: N/A;   UPPER GI ENDOSCOPY  08/17/2018   Procedure: UPPER GI ENDOSCOPY;  Surgeon: Toney Reil, MD;  Location: [facility] ENDOSCOPY;  Service: Gastroenterology;;    Current Medications: Current Meds  Medication Sig   albuterol (VENTOLIN HFA) 108 (90 Base) MCG/ACT inhaler Inhale 2 puffs into the lungs every 4 (four) hours as needed for wheezing or shortness of breath. Every 4-6 hrs prn   aluminum hydroxide-magnesium carbonate (GAVISCON) 95-358 MG/15ML SUSP Take 15 mLs by mouth 4 (four) times daily - after meals and at bedtime.   amLODipine (NORVASC) 10 MG tablet Take 10 mg by mouth daily.    atorvastatin (LIPITOR) 20 MG tablet Take 20 mg by mouth at bedtime.    Budeson-Glycopyrrol-Formoterol (BREZTRI AEROSPHERE) 160-9-4.8 MCG/ACT AERO Inhale 2 puffs into the lungs in the morning and at bedtime.   Budeson-Glycopyrrol-Formoterol (BREZTRI AEROSPHERE) 160-9-4.8 MCG/ACT AERO Take 2 puffs first thing in am and then another 2 puffs about 12 hours later.   Budeson-Glycopyrrol-Formoterol (BREZTRI AEROSPHERE) 160-9-4.8 MCG/ACT AERO Inhale 2 puffs into the lungs in the morning and at bedtime.   carvedilol (COREG) 6.25 MG tablet Take 1 tablet (6.25 mg total) by mouth 2 (two) times daily.   D-5000 125 MCG (5000 UT) TABS Take 1 tablet by mouth daily.   losartan (COZAAR) 25  MG tablet Take 1 tablet (25 mg) by mouth once daily   magnesium oxide (MAG-OX) 400 MG tablet Take 400 mg by mouth daily.   omeprazole (PRILOSEC) 40 MG capsule Take 1 tablet (40 mg) by mouth once daily     Allergies:   Patient has no known allergies.   Social History   Socioeconomic History   Marital status: Single    Spouse name: sister, Steward Drone    Number of children: 3   Years of education: Not on file   Highest education level: Not on file  Occupational History   Occupation: Lobbyist    Comment: came out of retirement to work  Tobacco Use   Smoking status: Former    Packs/day: 0.75    Years: 57.00    Additional pack years: 0.00    Total pack years: 42.75    Types: Cigarettes    Start date: 03/12/2018    Quit date: 2020    Years since quitting: 4.3   Smokeless tobacco: Never  Vaping Use   Vaping Use: Former  Substance and Sexual Activity   Alcohol use: Yes    Comment: occasional   Drug use: Not Currently   Sexual activity: Not on file  Other Topics Concern   Not on file  Social History Narrative   Lives with sister, Games developer   Social Determinants of Health   Financial Resource Strain: Not on file  Food Insecurity: Not on file  Transportation Needs: Not on file  Physical Activity: Not on file  Stress: Not on file  Social Connections: Not on file     Family History: The patient's family history includes Cancer in his father.  ROS:   Please see the history of present illness.     All other systems reviewed and are negative.  EKGs/Labs/Other Studies Reviewed:    The following studies were reviewed today:   EKG:  EKG not ordered today.   Recent Labs: 08/16/2022: BUN 24; Creatinine, Ser 2.03; Potassium 4.0; Sodium 139 09/10/2022: ALT 10  Recent Lipid Panel    Component Value Date/Time   CHOL 103 09/10/2022 0921   TRIG 68 09/10/2022 0921   HDL 31 (L) 09/10/2022 0921   CHOLHDL 3.3 09/10/2022 0921   VLDL 14 09/10/2022 0921   LDLCALC 58 09/10/2022 0921   LDLDIRECT 57 09/10/2022 0921     Risk Assessment/Calculations:          Physical Exam:    VS:  BP 138/68 (BP Location: Left Arm, Patient Position: Sitting, Cuff Size: Normal)   Pulse 80   Ht 5\' 7"  (1.702 m)   Wt 137 lb 3.2 oz (62.2 kg)   SpO2 97%   BMI 21.49 kg/m     Wt Readings from Last 3 Encounters:  11/11/22 137 lb 3.2 oz (62.2  kg)  10/29/22 138 lb 6.4 oz (62.8 kg)  09/17/22 143 lb (64.9 kg)     GEN:  Well nourished, well developed in no acute distress HEENT: Normal NECK: No JVD; No carotid bruits CARDIAC: RRR, no murmurs, rubs, gallops RESPIRATORY: Diminished breath sound bilaterally, no wheezing ABDOMEN: Soft, non-tender, non-distended MUSCULOSKELETAL:  No edema; No deformity  SKIN: Warm and dry NEUROLOGIC:  Alert and oriented x 3 PSYCHIATRIC:  Normal affect   ASSESSMENT:    1. Dyspnea on exertion   2. Essential hypertension   3. Pure hypercholesterolemia   4. Chronic kidney disease, unspecified CKD stage    PLAN:    In order of problems listed  above:  Dyspnea on exertion, echo with normal EF 55 to 60%, impaired relaxation.  Lexiscan Myoview with no significant ischemia, low risk study.  Etiology possibly pulmonary in light of COPD diagnosis.  Refer to pulmonary medicine. Hypertension, BP controlled.  Continue Norvasc, losartan, Coreg. Hyperlipidemia, cholesterol controlled.  Continue Lipitor 20 mg. CKD, previously referred to nephrology.  Keep appointment.  Continue low-dose losartan for renal protection unless otherwise adjusted per nephrology.    Follow-up as needed.   Medication Adjustments/Labs and Tests Ordered: Current medicines are reviewed at length with the patient today.  Concerns regarding medicines are outlined above.  No orders of the defined types were placed in this encounter.  No orders of the defined types were placed in this encounter.   Patient Instructions  Medication Instructions:   None Ordered  *If you need a refill on your cardiac medications before your next appointment, please call your pharmacy*   Lab Work:  None Ordered  If you have labs (blood work) drawn today and your tests are completely normal, you will receive your results only by: MyChart Message (if you have MyChart) OR A paper copy in the mail If you have any lab test that is abnormal or we  need to change your treatment, we will call you to review the results.   Testing/Procedures:  None Ordered   Follow-Up: At [facility], you and your health needs are our priority.  As part of our continuing mission to provide you with exceptional heart care, we have created designated Provider Care Teams.  These Care Teams include your primary Cardiologist (physician) and Advanced Practice Providers (APPs -  Physician Assistants and Nurse Practitioners) who all work together to provide you with the care you need, when you need it.  We recommend signing up for the patient portal called "MyChart".  Sign up information is provided on this After Visit Summary.  MyChart is used to connect with patients for Virtual Visits (Telemedicine).  Patients are able to view lab/test results, encounter notes, upcoming appointments, etc.  Non-urgent messages can be sent to your provider as well.   To learn more about what you can do with MyChart, go to [email or web address].    Your next appointment:    AS NEEDED   Other Instructions  [facility] Ph:  [phone]  [facility] Phone: [phone]    Signed, Debbe Odea, MD  11/11/2022 9:21 AM    [facility]

## 2022-11-11 NOTE — Patient Instructions (Addendum)
Medication Instructions:   None Ordered  *If you need a refill on your cardiac medications before your next appointment, please call your pharmacy*   Lab Work:  None Ordered  If you have labs (blood work) drawn today and your tests are completely normal, you will receive your results only by: MyChart Message (if you have MyChart) OR A paper copy in the mail If you have any lab test that is abnormal or we need to change your treatment, we will call you to review the results.   Testing/Procedures:  None Ordered   Follow-Up: At [facility], you and your health needs are our priority.  As part of our continuing mission to provide you with exceptional heart care, we have created designated Provider Care Teams.  These Care Teams include your primary Cardiologist (physician) and Advanced Practice Providers (APPs -  Physician Assistants and Nurse Practitioners) who all work together to provide you with the care you need, when you need it.  We recommend signing up for the patient portal called "MyChart".  Sign up information is provided on this After Visit Summary.  MyChart is used to connect with patients for Virtual Visits (Telemedicine).  Patients are able to view lab/test results, encounter notes, upcoming appointments, etc.  Non-urgent messages can be sent to your provider as well.   To learn more about what you can do with MyChart, go to [email or web address].    Your next appointment:    AS NEEDED   Other Instructions  [facility] Ph:  [phone]  [facility] Phone: [phone]

## 2022-12-21 ENCOUNTER — Other Ambulatory Visit: Payer: Self-pay | Admitting: Nephrology

## 2022-12-21 DIAGNOSIS — N1832 Chronic kidney disease, stage 3b: Secondary | ICD-10-CM

## 2023-01-04 ENCOUNTER — Ambulatory Visit
Admission: RE | Admit: 2023-01-04 | Discharge: 2023-01-04 | Disposition: A | Discharge status: Home / Self Care | Payer: Medicare HMO | Source: Ambulatory Visit | Attending: Nephrology | Admitting: Nephrology

## 2023-01-04 DIAGNOSIS — N1832 Chronic kidney disease, stage 3b: Secondary | ICD-10-CM | POA: Insufficient documentation

## 2023-01-19 ENCOUNTER — Encounter: Payer: Self-pay | Admitting: Family Medicine

## 2023-02-28 NOTE — Progress Notes (Unsigned)
Donald Foster, male    DOB: 1945-08-13   MRN: [id number]   Brief patient profile:  77 yobm  quit smoking 2020 with onset of cough/ wheeze  referred to pulmonary clinic in [facility]  09/17/2022 by [name] for copd eval    History of Present Illness  09/17/2022  Pulmonary/ 1st office eval/ [employer] / [employer]  Office symbicort/ spiriva not helping  Chief Complaint  Patient presents with   Consult    DOE for a year. Some wheezing. Cough with white sputum. Has been told he has COPD from PCP.   Dyspnea:  slower pace [employer] might be too much for him  Cough: esp in am/ slt rattle > white mucus only   Sleep: bed is flat / a couple of pillows s resp cc  SABA use: not using Rec Plan A = Automatic = Always=    Breztri Take 2 puffs first thing in am and then another 2 puffs about 12 hours later.  Work on inhaler technique:  Plan B = Backup (to supplement plan A, not to replace it) Only use your albuterol inhaler as a rescue medication  Also  Ok to try albuterol 15 min before an activity (on alternating days)  that you know would usually make you short of breath   Please schedule a follow up office visit in 6 weeks, call sooner if needed with all medications /inhalers/ solutions in hand    10/29/2022  f/u ov/Donald Foster/ [facility] re: AB/ presumed copd  maint on Breztri  though hfa not optimal technique Chief Complaint  Patient presents with   Follow-up    Breathing is better! SOB with exertion. Some wheezing. Cough with white sputum.  Dyspnea:  work easier as Automotive engineer Cough: variable white  with subjective wheeze  Sleeping: flat bed, pillows x 2 - no noct resp cc  SABA use: not using  02: none  Lung cancer sreening  referred today  Rec My office will be contacting you by phone for referral to lung cancer screening program and PFTs - if you don't hear back from my office within one week please call us back or notify us thru MyChart and we'll address it right  away.  Work on inhaler technique:    Please schedule a follow up visit in 6 months but call sooner if needed     03/01/2023  f/u ov/[place] office/Donald Foster re: AB maint on breztri  pfts/ ldsct not done as rec  Chief Complaint  Patient presents with   COPD  Dyspnea:  one aisle grocery store, still ok sitting on forklift Cough: variable but esp noct > yellowish esp ina m  Sleeping: flat bed 2 pillows cough wakes him up once a week  SABA use: none  02: none  Cp midline new with neg myoview 09/02/22 and not reproducible with exertion almost always at rest x up to 5 min   Lung cancer screening: referred   No obvious day to day or daytime variability or assoc excess/ purulent sputum or mucus plugs or hemoptysis or  chest tightness, subjective wheeze or overt sinus or hb symptoms.    Also denies any obvious fluctuation of symptoms with weather or environmental changes or other aggravating or alleviating factors except as outlined above   No unusual exposure hx or h/o childhood pna/ asthma or knowledge of premature birth.  Current Allergies, Complete Past Medical History, Past Surgical History, Family History, and Social History were reviewed in [facility] electronic medical record.  ROS  The following are not active complaints unless bolded Hoarseness, sore throat, dysphagia, dental problems, itching, sneezing,  nasal congestion or discharge of excess mucus or purulent secretions, ear ache,   fever, chills, sweats, unintended wt loss or wt gain, classically pleuritic or exertional cp,  orthopnea pnd or arm/hand swelling  or leg swelling, presyncope, palpitations, abdominal pain, anorexia, nausea, vomiting, diarrhea  or change in bowel habits or change in bladder habits, change in stools or change in urine, dysuria, hematuria,  rash, arthralgias, visual complaints, headache, numbness, weakness or ataxia or problems with walking or coordination,  change in mood or  memory.        Current  Meds  Medication Sig   albuterol (VENTOLIN HFA) 108 (90 Base) MCG/ACT inhaler Inhale 2 puffs into the lungs every 4 (four) hours as needed for wheezing or shortness of breath. Every 4-6 hrs prn   aluminum hydroxide-magnesium carbonate (GAVISCON) 95-358 MG/15ML SUSP Take 15 mLs by mouth 4 (four) times daily - after meals and at bedtime.   amLODipine (NORVASC) 10 MG tablet Take 10 mg by mouth daily.    atorvastatin (LIPITOR) 20 MG tablet Take 20 mg by mouth at bedtime.    Budeson-Glycopyrrol-Formoterol (BREZTRI AEROSPHERE) 160-9-4.8 MCG/ACT AERO Inhale 2 puffs into the lungs in the morning and at bedtime.   carvedilol (COREG) 6.25 MG tablet Take 1 tablet (6.25 mg total) by mouth 2 (two) times daily.   cetirizine (ZYRTEC) 10 MG tablet Take 10 mg by mouth at bedtime.   Cholecalciferol (VITAMIN D3) 125 MCG (5000 UT) CAPS Take 1 capsule by mouth daily.   losartan (COZAAR) 25 MG tablet Take 1 tablet (25 mg) by mouth once daily   magnesium oxide (MAG-OX) 400 MG tablet Take 400 mg by mouth daily.   omeprazole (PRILOSEC) 40 MG capsule Take 1 tablet (40 mg) by mouth once daily   SPIRIVA HANDIHALER 18 MCG inhalation capsule Place 1 capsule into inhaler and inhale daily as needed. TAKES PRN FOR SHOB/WHEEZE          Past Medical History:  Diagnosis Date   AKI (acute kidney injury) (HCC) 01/14/2020   BPH (benign prostatic hyperplasia)    COPD (chronic obstructive pulmonary disease) (HCC)    Family history of cancer    Genetic testing 09/22/2021   GERD (gastroesophageal reflux disease)    Hypercholesteremia    Hypertension    Personal history of colonic polyps       Objective:    wts  03/01/2023        134  10/29/2022       138  09/17/22 143 lb (64.9 kg)  09/10/22 142 lb 6.4 oz (64.6 kg)  08/16/22 140 lb 2 oz (63.6 kg)     Vital signs reviewed  03/01/2023  - Note at rest 02 sats  91% on RA   General appearance:    amb pleasant bm nad     HEENT : Oropharynx  clear / edentulous     NECK :   without  apparent JVD/ palpable Nodes/TM    LUNGS: no acc muscle use,  Min barrel  contour chest wall with bilateral  slightly decreased bs s audible wheeze and  without cough on insp or exp maneuvers and min  Hyperresonant  to  percussion bilaterally    CV:  RRR  no s3 or murmur or increase in P2, and no edema   ABD:  soft and nontender with pos end  insp Hoover's  in the supine position.  No bruits or organomegaly appreciated   MS:  Nl gait/ ext warm without deformities Or obvious joint restrictions  calf tenderness, cyanosis - Mild clubbing     SKIN: warm and dry without lesions    NEURO:  alert, approp, nl sensorium with  no motor or cerebellar deficits apparent.                          Assessment

## 2023-03-01 ENCOUNTER — Encounter: Payer: Self-pay | Admitting: Internal Medicine

## 2023-03-01 ENCOUNTER — Ambulatory Visit (INDEPENDENT_AMBULATORY_CARE_PROVIDER_SITE_OTHER): Payer: 59 | Admitting: Internal Medicine

## 2023-03-01 ENCOUNTER — Other Ambulatory Visit: Payer: Self-pay | Admitting: Internal Medicine

## 2023-03-01 VITALS — BP 127/77 | HR 77 | Ht 67.0 in | Wt 134.0 lb

## 2023-03-01 DIAGNOSIS — Z87891 Personal history of nicotine dependence: Secondary | ICD-10-CM | POA: Diagnosis not present

## 2023-03-01 DIAGNOSIS — J4489 Other specified chronic obstructive pulmonary disease: Secondary | ICD-10-CM

## 2023-03-01 MED ORDER — FAMOTIDINE 20 MG PO TABS: ORAL_TABLET | ORAL | 11 refills | Status: AC

## 2023-03-01 NOTE — Assessment & Plan Note (Addendum)
Quit smoking 2020 with mild/mod clubbing on exam 09/17/2022  - LDSCT  04/17/2019 Lungs/Pleura: Mild interlobular and paraseptal emphysematouschanges, upper lung predominant. Lower lobe bronchiectasis. Scarring/atelectasis in the bilateral lower lobes. Lingular scarring/atelectasis. - 09/17/2022    try change spiriva dpi/symbicort to breztri 2bid and approp saba - 09/17/2022   Walked on RA  x  3  lap(s) =  approx 525  ft  @ avg pace, stopped due to end of study s sob  with lowest 02 sats 95%  - 03/01/2023  After extensive coaching inhaler device,  effectiveness =  75% (short ti)   breztri 2bid and approp saba  - 03/01/2023   Walked on RA2 x  3  lap(s) =  approx 300  ft  @ mod pace, stopped due to end of study  with lowest 02 sats 93% and no sob or cp     Group D (now reclassified as E) in terms of symptom/risk and laba/lama/ICS  therefore appropriate rx at this point >>>  breztri pending pfts and saba prn     Re SABA :  I spent extra time with pt today reviewing appropriate use of albuterol for prn use on exertion with the following points: 1) saba is for relief of sob that does not improve by walking a slower pace or resting but rather if the pt does not improve after trying this first. 2) If the pt is convinced, as many are, that saba helps recover from activity faster then it's easy to tell if this is the case by re-challenging : ie stop, take the inhaler, then p 5 minutes try the exact same activity (intensity of workload) that just caused the symptoms and see if they are substantially diminished or not after saba 3) if there is an activity that reproducibly causes the symptoms, try the saba 15 min before the activity on alternate days   If in fact the saba really does help, then fine to continue to use it prn but advised may need to look closer at the maintenance regimen being used to achieve better control of airways disease with exertion.   Also for noct cough and atyical cp rec  Mucinex dm prn   Max gerd rx F/u Pulmonary in [place]  see avs for instructions unique to this ov

## 2023-03-01 NOTE — Assessment & Plan Note (Addendum)
Low-dose CT lung cancer screening is recommended for patients who are 31-75 years of age with a 20+ pack-year history of smoking and who are currently smoking or quit <=15 years ago. No coughing up blood  No unintentional weight loss of > 15 pounds in the last 6 months - pt is eligible for scanning yearly until age 33 depending on insurance coverage > referred back to [facility] hospital and pulmonary clinic there at his request.   Each maintenance medication was reviewed in detail including emphasizing most importantly the difference between maintenance and prns and under what circumstances the prns are to be triggered using an action plan format where appropriate.  Total time for H and P, chart review, counseling, reviewing hfa device(s) , directly observing portions of ambulatory 02 saturation study/ and generating customized AVS unique to this office visit / same day charting  > 30 min summary f/u ov

## 2023-03-01 NOTE — Patient Instructions (Addendum)
My office will be contacting you by phone for referral to PFTs and lung cancer screening CT to be done at [facility]  - if you don't hear back from my office within one week please call us back or notify us thru MyChart and we'll address it right away.    Omeprazole 40 mg Take 30-60 min before first meal of the day and ADD PEPCID 20 mg after supper or bedtime    Plan A = Automatic = Always= Breztri Take 2 puffs first thing in am and then another 2 puffs about 12 hours later.    Work on inhaler technique:  relax and gently blow all the way out then take a nice smooth full deep breath back in, triggering the inhaler at same time you start breathing in.  Hold breath in for at least  5 seconds if you can. Blow out [employer]  thru nose. Rinse and gargle with water when done.  If mouth or throat bother you at all,  try brushing teeth/gums/tongue with arm and hammer toothpaste/ make a slurry and gargle and spit out.   >>>  Remember how golfers warm up by taking practice swings - do this with an empty inhaler      Plan B = Backup (to supplement plan A, not to replace it) Only use your albuterol inhaler as a rescue medication to be used if you can't catch your breath by resting or doing a relaxed purse lip breathing pattern.  - The less you use it, the better it will work when you need it. - Ok to use the inhaler up to 2 puffs  every 4 hours if you must but call for appointment if use goes up over your usual need - Don't leave home without it !!  (think of it like the spare tire for your car)   For cough/ congestion >  mucinex dm  up to maximum of  1200 mg every 12 hours as needed  We will set you up with a [facility] doctor for your next visit in 6 weeks

## 2023-04-05 ENCOUNTER — Ambulatory Visit: Payer: Medicare HMO | Admitting: Gastroenterology

## 2023-04-12 ENCOUNTER — Ambulatory Visit (INDEPENDENT_AMBULATORY_CARE_PROVIDER_SITE_OTHER): Payer: 59 | Admitting: Pulmonary Disease

## 2023-04-12 ENCOUNTER — Encounter: Payer: Self-pay | Admitting: Pulmonary Disease

## 2023-04-12 VITALS — BP 110/60 | HR 69 | Temp 98.1°F | Ht 67.0 in | Wt 137.6 lb

## 2023-04-12 DIAGNOSIS — R0602 Shortness of breath: Secondary | ICD-10-CM | POA: Diagnosis not present

## 2023-04-12 MED ORDER — ALBUTEROL SULFATE HFA 108 (90 BASE) MCG/ACT IN AERS
2.0000 | INHALATION_SPRAY | Freq: Four times a day (QID) | RESPIRATORY_TRACT | 2 refills | Status: DC | PRN
Start: 2023-04-12 — End: 2023-09-07

## 2023-04-12 MED ORDER — BREZTRI AEROSPHERE 160-9-4.8 MCG/ACT IN AERO: 2.0000 | INHALATION_SPRAY | Freq: Two times a day (BID) | RESPIRATORY_TRACT | 6 refills | Status: DC

## 2023-04-12 NOTE — Progress Notes (Addendum)
Synopsis: Referred in by Lorn Junes, FNP   Subjective:   PATIENT ID: Donald Foster GENDER: male DOB: Mar 21, 1946, MRN: [id number]  Chief Complaint  Patient presents with   Follow-up    Shortness of breath on exertion. Cough and occasional wheezing.     HPI Donald Foster is a 77 year old male patient with a past medical history of tobacco use disorder presenting to the pulmonary clinic today to establish care.  He saw Dr. Sherene Sires in August for a clinical diagnosis of COPD and was started on Breztri.  He says he feels slightly better on Breztri however still has some shortness of breath on exertion coughing spells that make him dizzy slight wheezing and tightness.  He is on Zyrtec for possible allergic rhinitis.  He was never hospitalized for COPD or asthma exacerbation.  He does not carry the diagnosis of asthma.  Family history he has no family history of lung diseases.  Social history he quit smoking 6 years ago smoked 2 pack/day and started at 77 years old.  No alcohol use and no illicit drug use.  He occasionally smokes marijuana.  He works in loading and unloading trucks.  He does not have any pets at home.  ROS All systems were reviewed and are negative except for the above. Objective:   Vitals:   04/12/23 1021  BP: 110/60  Pulse: 69  Temp: 98.1 F (36.7 C)  TempSrc: Temporal  SpO2: 95%  Weight: 137 lb 9.6 oz (62.4 kg)  Height: 5\' 7"  (1.702 m)   95% on RA BMI Readings from Last 3 Encounters:  04/12/23 21.55 kg/m  03/01/23 20.99 kg/m  11/11/22 21.49 kg/m   Wt Readings from Last 3 Encounters:  04/12/23 137 lb 9.6 oz (62.4 kg)  03/01/23 134 lb (60.8 kg)  11/11/22 137 lb 3.2 oz (62.2 kg)    Physical Exam GEN: NAD, Healthy Appearing HEENT: Supple Neck, Reactive Pupils, EOMI  CVS: Normal S1, Normal S2, RRR, No murmurs or ES appreciated  Lungs: Diminished over the left hemithorax Abdomen: Soft, non tender, non distended, + BS  Extremities: Warm and well  perfused, No edema  Skin: No suspicious lesions appreciated  Psych: Normal Affect  Ancillary Information   CBC    Component Value Date/Time   WBC 4.1 04/26/2020 0751   RBC 4.75 04/26/2020 0751   HGB 11.9 (L) 04/26/2020 0751   HCT 36.3 (L) 04/26/2020 0751   PLT 191 04/26/2020 0751   MCV 76.4 (L) 04/26/2020 0751   MCH 25.1 (L) 04/26/2020 0751   MCHC 32.8 04/26/2020 0751   RDW 17.3 (H) 04/26/2020 0751   LYMPHSABS 1.6 01/16/2020 0409   MONOABS 0.4 01/16/2020 0409   EOSABS 0.1 01/16/2020 0409   BASOSABS 0.0 01/16/2020 0409       No data to display           Assessment & Plan:  Donald Foster is a 77 year old male patient with a past medical history of tobacco use disorder presenting to the pulmonary clinic today to establish care.  #Shortness of breath secondary to presumed COPD  CAT 11 []  C/w Budesonide-glycopyrrolate-formoterol fumarate [Breztri] 160-9-4.8 2 puffs twice a day.  []  Albuterol 2puffs Q6H as needed  []  PFTs  []  CXR for today  #History of heavy tobacco use Quit 6 or 7 years ago. Smoked 2 ppd for 55 years.  []  Refer to lung cancer screening.   Return in about 3 months (around 07/13/2023).  I spent 60 minutes caring for  this patient today, including preparing to see the patient, obtaining a medical history , reviewing a separately obtained history, performing a medically appropriate examination and/or evaluation, counseling and educating the patient/family/caregiver, ordering medications, tests, or procedures, documenting clinical information in the electronic health record, and independently interpreting results (not separately reported/billed) and communicating results to the patient/family/caregiver  Janann Colonel, MD [facility] 04/12/2023 10:45 AM

## 2023-05-03 ENCOUNTER — Other Ambulatory Visit: Payer: Self-pay | Admitting: *Deleted

## 2023-05-03 DIAGNOSIS — Z122 Encounter for screening for malignant neoplasm of respiratory organs: Secondary | ICD-10-CM

## 2023-05-03 DIAGNOSIS — Z87891 Personal history of nicotine dependence: Secondary | ICD-10-CM

## 2023-05-10 ENCOUNTER — Telehealth: Payer: Self-pay

## 2023-05-10 ENCOUNTER — Ambulatory Visit: Payer: 59

## 2023-05-10 NOTE — Telephone Encounter (Signed)
Donald Foster 10-20-2045 Patient called and left a voicemail wanting to reschedule his appointment I called patient back to let him know we received his message, and the patient stated his appointment has already reschedule his appointment.

## 2023-05-17 ENCOUNTER — Ambulatory Visit: Payer: 59 | Admitting: Physician Assistant

## 2023-06-13 NOTE — Progress Notes (Unsigned)
Celso Amy, PA-C [address]  [address]  [place], [place] [address]  Main: [phone]  Fax: [phone]   Gastroenterology Consultation  Referring Provider:     Lorn Junes, FNP Primary Care Physician:  Lorn Junes, FNP (Inactive) Primary Gastroenterologist:  Celso Amy, PA-C / Dr. Lannette Donath   Reason for Consultation:     Colon polyp, duodenal ulcer, external hemorrhoids        HPI:   Donald Foster is a 77 y.o. y/o male referred for consultation & management  by Lorn Junes, FNP (Inactive).  Established patient Dr. Allegra Lai.  07/2021 colonoscopy by Dr. Allegra Lai: 1 diminutive polyp from the cecum, 5 (3 mm to 5 mm) tubular adenoma polyps removed from descending and ascending colon, diverticulosis, external hemorrhoids.  Good prep.  3-year repeat (due 07/2024).  Colonoscopy 08/2018 showed hemorrhoids, large external hemorrhoid, 10 small (6 mm to 9 mm) tubular adenoma polyps removed throughout the entire colon.    EGD 07/2018 showed a single 6 mm angioectasia with bleeding in the second part of the duodenum.  Coagulated for hemostasis with argon plasma.  2 clips placed.  1 cm hiatal hernia.  Widely patent Schatzki's ring, otherwise normal.  EGD 08/2018 showed 1 nonbleeding 6 mm cratered duodenal ulcer in the second portion of duodenum.  Gastritis.  1 cm hiatal hernia.  H. pylori negative.  He has history of iron deficiency anemia.  Last lab 12/2022 showed mild microcytic anemia with hemoglobin 11.5, MCV 72, platelet 178.  Currently taking iron?  NSAIDs? Past Medical History:  Diagnosis Date   AKI (acute kidney injury) (HCC) 01/14/2020   BPH (benign prostatic hyperplasia)    COPD (chronic obstructive pulmonary disease) (HCC)    Family history of cancer    Genetic testing 09/22/2021   GERD (gastroesophageal reflux disease)    Hypercholesteremia    Hypertension    Personal history of colonic polyps     Past Surgical History:  Procedure Laterality Date    CHOLECYSTECTOMY     COLONOSCOPY WITH PROPOFOL N/A 08/17/2018   Procedure: COLONOSCOPY WITH PROPOFOL;  Surgeon: Toney Reil, MD;  Location: [facility] ENDOSCOPY;  Service: Gastroenterology;  Laterality: N/A;   COLONOSCOPY WITH PROPOFOL N/A 08/04/2021   Procedure: COLONOSCOPY WITH PROPOFOL;  Surgeon: Toney Reil, MD;  Location: [facility] ENDOSCOPY;  Service: Gastroenterology;  Laterality: N/A;   ESOPHAGOGASTRODUODENOSCOPY (EGD) WITH PROPOFOL N/A 08/01/2018   Procedure: ESOPHAGOGASTRODUODENOSCOPY (EGD) WITH PROPOFOL;  Surgeon: Toney Reil, MD;  Location: [facility] ENDOSCOPY;  Service: Gastroenterology;  Laterality: N/A;   HEMORRHOID SURGERY N/A 02/22/2019   Procedure: HEMORRHOIDECTOMY;  Surgeon: Leafy Ro, MD;  Location: [facility] ORS;  Service: General;  Laterality: N/A;   HOLEP-LASER ENUCLEATION OF THE PROSTATE WITH MORCELLATION N/A 02/18/2020   Procedure: HOLEP-LASER ENUCLEATION OF THE PROSTATE WITH MORCELLATION;  Surgeon: Vanna Scotland, MD;  Location: [facility] ORS;  Service: Urology;  Laterality: N/A;   UPPER GI ENDOSCOPY  08/17/2018   Procedure: UPPER GI ENDOSCOPY;  Surgeon: Toney Reil, MD;  Location: [facility] ENDOSCOPY;  Service: Gastroenterology;;    Prior to Admission medications   Medication Sig Start Date End Date Taking? Authorizing Provider  albuterol (VENTOLIN HFA) 108 (90 Base) MCG/ACT inhaler Inhale 2 puffs into the lungs every 4 (four) hours as needed for wheezing or shortness of breath. Every 4-6 hrs prn    [provider]  albuterol (VENTOLIN HFA) 108 (90 Base) MCG/ACT inhaler Inhale 2 puffs into the lungs every 6 (six) hours  as needed for wheezing or shortness of breath. 04/12/23   Assaker, West Bali, MD  aluminum hydroxide-magnesium carbonate (GAVISCON) 95-358 MG/15ML SUSP Take 15 mLs by mouth 4 (four) times daily - after meals and at bedtime. 05/19/18   Arnaldo Natal, MD  amLODipine (NORVASC) 10 MG tablet Take 10 mg by mouth daily.  05/03/18   [provider]  atorvastatin (LIPITOR) 20 MG tablet Take 20 mg by mouth at bedtime.  05/03/18   [provider]  Budeson-Glycopyrrol-Formoterol (BREZTRI AEROSPHERE) 160-9-4.8 MCG/ACT AERO Inhale 2 puffs into the lungs in the morning and at bedtime. 10/29/22   Nyoka Cowden, MD  Budeson-Glycopyrrol-Formoterol (BREZTRI AEROSPHERE) 160-9-4.8 MCG/ACT AERO Inhale 2 puffs into the lungs in the morning and at bedtime. 04/12/23   Assaker, West Bali, MD  carvedilol (COREG) 6.25 MG tablet Take 1 tablet (6.25 mg total) by mouth 2 (two) times daily. 09/10/22   Dunn, Raymon Mutton, PA-C  cetirizine (ZYRTEC) 10 MG tablet Take 10 mg by mouth at bedtime. 11/29/22   [provider]  Cholecalciferol (VITAMIN D3) 125 MCG (5000 UT) CAPS Take 1 capsule by mouth daily. 12/28/22   [provider]  famotidine (PEPCID) 20 MG tablet One after supper 03/01/23   Nyoka Cowden, MD  losartan (COZAAR) 25 MG tablet Take 1 tablet (25 mg) by mouth once daily    [provider]  magnesium oxide (MAG-OX) 400 MG tablet Take 400 mg by mouth daily. 08/05/22   [provider]  omeprazole (PRILOSEC) 40 MG capsule Take 1 tablet (40 mg) by mouth once daily    [provider]  SPIRIVA HANDIHALER 18 MCG inhalation capsule Place 1 capsule into inhaler and inhale daily as needed. TAKES PRN FOR SHOB/WHEEZE 02/08/23   [provider]    Family History  Problem Relation Age of Onset   Cancer Father      Social History   Tobacco Use   Smoking status: Former    Current packs/day: 0.00    Average packs/day: 0.8 packs/day for 57.0 years (42.8 ttl pk-yrs)    Types: Cigarettes    Start date: 03/12/2018    Quit date: 2020    Years since quitting: 4.9   Smokeless tobacco: Never  Vaping Use   Vaping status: Former  Substance Use Topics   Alcohol use: Yes    Comment: occasional   Drug use: Not Currently    Allergies as of 06/14/2023   (No Known Allergies)    Review of Systems:    All systems  reviewed and negative except where noted in HPI.   Physical Exam:  There were no vitals taken for this visit. No LMP for male patient.  General:   Alert,  Well-developed, well-nourished, pleasant and cooperative in NAD Lungs:  Respirations even and unlabored.  Clear throughout to auscultation.   No wheezes, crackles, or rhonchi. No acute distress. Heart:  Regular rate and rhythm; no murmurs, clicks, rubs, or gallops. Abdomen:  Normal bowel sounds.  No bruits.  Soft, and non-distended without masses, hepatosplenomegaly or hernias noted.  No Tenderness.  No guarding or rebound tenderness.    Neurologic:  Alert and oriented x3;  grossly normal neurologically. Psych:  Alert and cooperative. Normal mood and affect.  Imaging Studies: No results found.  Assessment and Plan:   Donald Foster is a 77 y.o. y/o male has been referred for   1.  Hemorrhoids  Treat constipation  2.  History of duodenal ulcer  Avoid NSAIDs  3.  GERD  Continue PPI  4.  History of iron deficiency anemia  Labs: CBC, iron panel, ferritin, B12, folate  Iron supplement?  IV iron?  5.  History of multiple adenomatous colon polyps  3-year repeat colonoscopy will be due 07/2024.  Follow up ***  Celso Amy, PA-C    BP check ***

## 2023-06-14 ENCOUNTER — Encounter: Payer: Self-pay | Admitting: Physician Assistant

## 2023-06-14 ENCOUNTER — Ambulatory Visit (INDEPENDENT_AMBULATORY_CARE_PROVIDER_SITE_OTHER): Payer: 59 | Admitting: Physician Assistant

## 2023-06-14 VITALS — BP 128/77 | HR 63 | Temp 97.8°F | Ht 67.0 in | Wt 135.1 lb

## 2023-06-14 DIAGNOSIS — R195 Other fecal abnormalities: Secondary | ICD-10-CM | POA: Diagnosis not present

## 2023-06-14 DIAGNOSIS — Z8711 Personal history of peptic ulcer disease: Secondary | ICD-10-CM

## 2023-06-14 DIAGNOSIS — Z862 Personal history of diseases of the blood and blood-forming organs and certain disorders involving the immune mechanism: Secondary | ICD-10-CM

## 2023-06-14 DIAGNOSIS — Z8719 Personal history of other diseases of the digestive system: Secondary | ICD-10-CM

## 2023-06-14 DIAGNOSIS — Z860101 Personal history of adenomatous and serrated colon polyps: Secondary | ICD-10-CM

## 2023-06-14 DIAGNOSIS — K219 Gastro-esophageal reflux disease without esophagitis: Secondary | ICD-10-CM

## 2023-06-14 DIAGNOSIS — D649 Anemia, unspecified: Secondary | ICD-10-CM

## 2023-06-14 DIAGNOSIS — Z860100 Personal history of colon polyps, unspecified: Secondary | ICD-10-CM

## 2023-06-14 DIAGNOSIS — D509 Iron deficiency anemia, unspecified: Secondary | ICD-10-CM

## 2023-06-14 MED ORDER — OMEPRAZOLE 40 MG PO CPDR: 40.0000 mg | DELAYED_RELEASE_CAPSULE | Freq: Every day | ORAL | 3 refills | Status: AC

## 2023-06-14 NOTE — Patient Instructions (Signed)
Please Start Over the Counter Metamucil Fiber Supplement; Mix 1 packet in a drink once daily every day to help loose stools and prevent constipation.

## 2023-06-15 LAB — B12 AND FOLATE PANEL
Folate: 5.4 ng/mL (ref >3.0)
Vitamin B-12: 337 pg/mL (ref 232–1245)

## 2023-06-15 LAB — CBC WITH DIFFERENTIAL/PLATELET
Basophils Absolute: 0 10*3/uL (ref 0.0–0.2)
Basos: 0 %
EOS (ABSOLUTE): 0.2 10*3/uL (ref 0.0–0.4)
Eos: 4 %
Hematocrit: 36.5 % — ABNORMAL LOW (ref 37.5–51.0)
Hemoglobin: 11 g/dL — ABNORMAL LOW (ref 13.0–17.7)
Immature Grans (Abs): 0 10*3/uL (ref 0.0–0.1)
Immature Granulocytes: 1 %
Lymphocytes Absolute: 1.9 10*3/uL (ref 0.7–3.1)
Lymphs: 33 %
MCH: 21.9 pg — ABNORMAL LOW (ref 26.6–33.0)
MCHC: 30.1 g/dL — ABNORMAL LOW (ref 31.5–35.7)
MCV: 73 fL — ABNORMAL LOW (ref 79–97)
Monocytes Absolute: 1 10*3/uL — ABNORMAL HIGH (ref 0.1–0.9)
Monocytes: 18 %
Neutrophils Absolute: 2.6 10*3/uL (ref 1.4–7.0)
Neutrophils: 44 %
Platelets: 108 10*3/uL — ABNORMAL LOW (ref 150–450)
RBC: 5.03 x10E6/uL (ref 4.14–5.80)
RDW: 22.7 % — ABNORMAL HIGH (ref 11.6–15.4)
WBC: 5.8 10*3/uL (ref 3.4–10.8)

## 2023-06-15 LAB — IRON,TIBC AND FERRITIN PANEL
Ferritin: 24 ng/mL — ABNORMAL LOW (ref 30–400)
Iron Saturation: 6 % — CL (ref 15–55)
Iron: 18 ug/dL — ABNORMAL LOW (ref 38–169)
Total Iron Binding Capacity: 312 ug/dL (ref 250–450)
UIBC: 294 ug/dL (ref 111–343)

## 2023-06-16 ENCOUNTER — Telehealth: Payer: Self-pay

## 2023-06-16 NOTE — Progress Notes (Signed)
Call and notify patient labs show: 1.  Mild iron deficiency anemia.  Hemoglobin and iron are mildly low.  He needs to take OTC ferrous sulfate 325 mg 1 tablet once daily with vitamin C. **Schedule lab visit in 6 weeks to repeat CBC, iron panel, and ferritin. 2.  Vitamin B12 and folate are normal. Celso Amy, PA-C

## 2023-06-16 NOTE — Telephone Encounter (Signed)
-----   Message from Celso Amy sent at 06/16/2023 12:34 PM EST ----- Call and notify patient labs show: 1.  Mild iron deficiency anemia.  Hemoglobin and iron are mildly low.  He needs to take OTC ferrous sulfate 325 mg 1 tablet once daily with vitamin C. ##Schedule lab visit in 6 weeks to repeat CBC, iron panel, and ferritin. 2.  Vitamin B12 and folate are normal. Celso Amy, PA-C

## 2023-06-16 NOTE — Telephone Encounter (Signed)
Patient verbalized understanding of results and he will go back for labs in 6 weeks. Put a reminder in to remind him

## 2023-07-28 ENCOUNTER — Telehealth: Payer: Self-pay

## 2023-07-28 DIAGNOSIS — D649 Anemia, unspecified: Secondary | ICD-10-CM

## 2023-07-28 DIAGNOSIS — D509 Iron deficiency anemia, unspecified: Secondary | ICD-10-CM

## 2023-07-28 NOTE — Telephone Encounter (Signed)
Tried to call primary number and it said call can not be completed as dialed. Called home number and left a message for call back. Order lab work

## 2023-07-28 NOTE — Telephone Encounter (Signed)
Tried to call patient but unable to leave a message because voicemail is not set up

## 2023-07-28 NOTE — Telephone Encounter (Signed)
-----   Message from [facility] Kinta H sent at 06/16/2023 12:38 PM EST ----- CBC, iron panel, and ferritin.

## 2023-07-29 NOTE — Telephone Encounter (Signed)
Tried to call patient but voicemail is not set up  

## 2023-08-01 NOTE — Telephone Encounter (Signed)
Tried to call patient but voicemail is not set up. Mailed letter

## 2023-08-10 ENCOUNTER — Other Ambulatory Visit: Payer: Self-pay | Admitting: Pulmonary Disease

## 2023-08-10 DIAGNOSIS — R0602 Shortness of breath: Secondary | ICD-10-CM

## 2024-04-13 ENCOUNTER — Other Ambulatory Visit: Payer: Self-pay | Admitting: Pulmonary Disease

## 2024-04-13 DIAGNOSIS — R0602 Shortness of breath: Secondary | ICD-10-CM

## 2024-04-17 ENCOUNTER — Ambulatory Visit: Admitting: Pulmonary Disease

## 2024-04-19 ENCOUNTER — Encounter: Payer: Self-pay | Admitting: Pulmonary Disease

## 2024-05-02 ENCOUNTER — Ambulatory Visit
Admission: RE | Admit: 2024-05-02 | Discharge: 2024-05-02 | Disposition: A | Discharge status: Home / Self Care | Source: Ambulatory Visit | Attending: Acute Care | Admitting: Acute Care

## 2024-05-02 DIAGNOSIS — Z87891 Personal history of nicotine dependence: Secondary | ICD-10-CM | POA: Insufficient documentation

## 2024-05-02 DIAGNOSIS — Z122 Encounter for screening for malignant neoplasm of respiratory organs: Secondary | ICD-10-CM | POA: Insufficient documentation

## 2024-05-07 ENCOUNTER — Other Ambulatory Visit: Payer: Self-pay | Admitting: Acute Care

## 2024-05-07 DIAGNOSIS — Z122 Encounter for screening for malignant neoplasm of respiratory organs: Secondary | ICD-10-CM

## 2024-05-07 DIAGNOSIS — Z87891 Personal history of nicotine dependence: Secondary | ICD-10-CM

## 2024-05-22 ENCOUNTER — Telehealth: Payer: Self-pay

## 2024-05-22 ENCOUNTER — Other Ambulatory Visit: Payer: Self-pay

## 2024-05-22 DIAGNOSIS — Z860100 Personal history of colon polyps, unspecified: Secondary | ICD-10-CM

## 2024-05-22 MED ORDER — PEG 3350-KCL-NA BICARB-NACL 420 G PO SOLR: 4000.0000 mL | Freq: Once | ORAL | 0 refills | Status: AC

## 2024-05-22 NOTE — Telephone Encounter (Signed)
 Gastroenterology Pre-Procedure Review  Request Date: 08/22/23 Requesting Physician: Dr. Melany  PATIENT REVIEW QUESTIONS: The patient responded to the following health history questions as indicated:    1. Are you having any GI issues? no 2. Do you have a personal history of Polyps? yes (LAST COLONSOCOPY 08/04/21 RECOMMENDED REPEAT IN 3 YEARS) 3. Do you have a family history of Colon Cancer or Polyps? yes (AUNT COLON CANCER) 4. Diabetes Mellitus? no 5. Joint replacements in the past 12 months?no 6. Major health problems in the past 3 months?no 7. Any artificial heart valves, MVP, or defibrillator?no    MEDICATIONS & ALLERGIES:    Patient reports the following regarding taking any anticoagulation/antiplatelet therapy:   Plavix, Coumadin, Eliquis, Xarelto, Lovenox, Pradaxa, Brilinta, or Effient? no Aspirin? no  Patient confirms/reports the following medications:  Current Outpatient Medications  Medication Sig Dispense Refill   albuterol  (VENTOLIN  HFA) 108 (90 Base) MCG/ACT inhaler Inhale 2 puffs into the lungs every 6 (six) hours as needed for wheezing or shortness of breath. Please schedule office visit before any future refills 18 g 0   aluminum hydroxide-magnesium  carbonate (GAVISCON) 95-358 MG/15ML SUSP Take 15 mLs by mouth 4 (four) times daily - after meals and at bedtime. 1 Bottle 2   amLODipine  (NORVASC ) 10 MG tablet Take 10 mg by mouth daily.      atorvastatin  (LIPITOR) 20 MG tablet Take 20 mg by mouth at bedtime.      budesonide-glycopyrrolate-formoterol (BREZTRI  AEROSPHERE) 160-9-4.8 MCG/ACT AERO inhaler Inhale 2 puffs into the lungs in the morning and at bedtime. 10.7 g 6   carvedilol  (COREG ) 6.25 MG tablet Take 1 tablet (6.25 mg total) by mouth 2 (two) times daily. 180 tablet 3   cetirizine (ZYRTEC) 10 MG tablet Take 10 mg by mouth at bedtime.     Cholecalciferol (VITAMIN D3) 125 MCG (5000 UT) CAPS Take 1 capsule by mouth daily.     famotidine  (PEPCID ) 20 MG tablet One after  supper 30 tablet 11   losartan  (COZAAR ) 25 MG tablet Take 1 tablet (25 mg) by mouth once daily     magnesium  oxide (MAG-OX) 400 MG tablet Take 400 mg by mouth daily.     omeprazole  (PRILOSEC) 40 MG capsule Take 1 capsule (40 mg total) by mouth daily. Take 1 tablet (40 mg) by mouth once daily 90 capsule 3   SPIRIVA  HANDIHALER 18 MCG inhalation capsule Place 1 capsule into inhaler and inhale daily as needed. TAKES PRN FOR SHOB/WHEEZE     No current facility-administered medications for this visit.    Patient confirms/reports the following allergies:  No Known Allergies  No orders of the defined types were placed in this encounter.   AUTHORIZATION INFORMATION Primary Insurance: 1D#: Group #:  Secondary Insurance: 1D#: Group #:  SCHEDULE INFORMATION: Date: 08/21/24 Time: Location: [facility]

## 2024-05-25 ENCOUNTER — Telehealth (HOSPITAL_BASED_OUTPATIENT_CLINIC_OR_DEPARTMENT_OTHER): Payer: Self-pay | Admitting: *Deleted

## 2024-05-25 NOTE — Telephone Encounter (Signed)
   Pre-operative Risk Assessment    Patient Name: Donald Foster  DOB: 11/17/1945 MRN: [id number]   Date of last office visit: 11/11/2022 Date of next office visit: None  Request for Surgical Clearance    Procedure:  Colonoscopy  Date of Surgery:  Clearance 08/21/24                                 Surgeon:  Dr. Clotilda Schaffer Surgeon's Group or Practice Name:  [facility] Phone number:  [phone] Fax number:  [phone]   Type of Clearance Requested:   - Medical    Type of Anesthesia:  General    Additional requests/questions:    Signed, Edsel Grayce Sanders   05/25/2024, 8:41 AM

## 2024-05-25 NOTE — Telephone Encounter (Signed)
   Name: PADRAIG NHAN  DOB: 1946/04/06  MRN: [id number]  Primary Cardiologist: Redell Cave, MD  Chart reviewed as part of pre-operative protocol coverage. Patient has not been seen in over 1 year (since 11/2022). Because of Artavis Cowie Repinski's past medical history and time since last visit, he will require a follow-up in-office visit in order to better assess preoperative cardiovascular risk.  Pre-op covering staff: - Please schedule appointment and call patient to inform them. If patient already had an upcoming appointment within acceptable timeframe, please add pre-op clearance to the appointment notes so provider is aware. - Please contact requesting surgeon's office via preferred method (i.e, phone, fax) to inform them of need for appointment prior to surgery.  Gennett Garcia E Yaffa Seckman, PA-C  05/25/2024, 11:27 AM

## 2024-05-25 NOTE — Telephone Encounter (Signed)
 Called patient to schedule a in-office appointment for a pre-op clearance on 07/30/24 @ 8:20 with Dr. Campbell Cave, MD

## 2024-07-30 ENCOUNTER — Ambulatory Visit: Attending: Cardiology | Admitting: Cardiology

## 2024-07-30 ENCOUNTER — Encounter: Payer: Self-pay | Admitting: Cardiology

## 2024-07-30 VITALS — BP 130/68 | HR 65 | Ht 64.0 in | Wt 132.4 lb

## 2024-07-30 DIAGNOSIS — E7800 Pure hypercholesterolemia, unspecified: Secondary | ICD-10-CM | POA: Diagnosis not present

## 2024-07-30 DIAGNOSIS — I10 Essential (primary) hypertension: Secondary | ICD-10-CM | POA: Diagnosis not present

## 2024-07-30 DIAGNOSIS — Z01810 Encounter for preprocedural cardiovascular examination: Secondary | ICD-10-CM

## 2024-07-30 NOTE — Patient Instructions (Addendum)
 Medication Instructions:  Your physician recommends that you continue on your current medications as directed. Please refer to the Current Medication list given to you today.   *If you need a refill on your cardiac medications before your next appointment, please call your pharmacy*  Lab Work: No labs ordered today  If you have labs (blood work) drawn today and your tests are completely normal, you will receive your results only by: MyChart Message (if you have MyChart) OR A paper copy in the mail If you have any lab test that is abnormal or we need to change your treatment, we will call you to review the results.  Testing/Procedures: No test ordered today   Follow-Up: At [facility], you and your health needs are our priority.  As part of our continuing mission to provide you with exceptional heart care, our providers are all part of one team.  This team includes your primary Cardiologist (physician) and Advanced Practice Providers or APPs (Physician Assistants and Nurse Practitioners) who all work together to provide you with the care you need, when you need it.  Your next appointment:   As needed  Provider:   You may see Redell Cave, MD or one of the following Advanced Practice Providers on your designated Care Team:   Lonni Meager, NP Lesley Maffucci, PA-C Bernardino Bring, PA-C Cadence [place], PA-C Tylene Lunch, NP Barnie Hila, NP    We recommend signing up for the patient portal called MyChart.  Sign up information is provided on this After Visit Summary.  MyChart is used to connect with patients for Virtual Visits (Telemedicine).  Patients are able to view lab/test results, encounter notes, upcoming appointments, etc.  Non-urgent messages can be sent to your provider as well.   To learn more about what you can do with MyChart, go to [email or web address].

## 2024-07-30 NOTE — Progress Notes (Signed)
 " Cardiology Office Note:    Date:  07/30/2024   ID:  Donald Foster, DOB 08-22-1945, MRN [id number]  PCP:  Rollene Therisa BRAVO, FNP (Inactive)   [facility] Providers Cardiologist:  Redell Cave, MD     Referring MD: No ref. provider found   Chief Complaint  Patient presents with   Follow-up    Clearance for coloscopy, pat has been doing well with no complaints of chest pain, chest pressure or SOB, medication reviewed verbally with patient    History of Present Illness:    Donald Foster is a 79 y.o. male with a hx of hypertension, hyperlipidemia, former smoker x 40+ years, CKD, COPD who presents for preop evaluation.  Screening colonoscopy is being planned.  Patient denies chest pain, endorses shortness of breath which is chronic.  Compliant medications as prescribed.  Feels well, has no concerns at this time.  Past Medical History:  Diagnosis Date   AKI (acute kidney injury) 01/14/2020   BPH (benign prostatic hyperplasia)    COPD (chronic obstructive pulmonary disease) (HCC)    Family history of cancer    Genetic testing 09/22/2021   GERD (gastroesophageal reflux disease)    Hypercholesteremia    Hypertension    Personal history of colonic polyps     Past Surgical History:  Procedure Laterality Date   CHOLECYSTECTOMY     COLONOSCOPY WITH PROPOFOL  N/A 08/17/2018   Procedure: COLONOSCOPY WITH PROPOFOL ;  Surgeon: Unk Donald Skiff, MD;  Location: [facility] ENDOSCOPY;  Service: Gastroenterology;  Laterality: N/A;   COLONOSCOPY WITH PROPOFOL  N/A 08/04/2021   Procedure: COLONOSCOPY WITH PROPOFOL ;  Surgeon: Unk Donald Skiff, MD;  Location: [facility] ENDOSCOPY;  Service: Gastroenterology;  Laterality: N/A;   ESOPHAGOGASTRODUODENOSCOPY (EGD) WITH PROPOFOL  N/A 08/01/2018   Procedure: ESOPHAGOGASTRODUODENOSCOPY (EGD) WITH PROPOFOL ;  Surgeon: Unk Donald Skiff, MD;  Location: [facility] ENDOSCOPY;  Service: Gastroenterology;  Laterality: N/A;   HEMORRHOID SURGERY N/A 02/22/2019    Procedure: HEMORRHOIDECTOMY;  Surgeon: Donald Laneta FALCON, MD;  Location: [facility] ORS;  Service: General;  Laterality: N/A;   HOLEP-LASER ENUCLEATION OF THE PROSTATE WITH MORCELLATION N/A 02/18/2020   Procedure: HOLEP-LASER ENUCLEATION OF THE PROSTATE WITH MORCELLATION;  Surgeon: Donald Knee, MD;  Location: [facility] ORS;  Service: Urology;  Laterality: N/A;   UPPER GI ENDOSCOPY  08/17/2018   Procedure: UPPER GI ENDOSCOPY;  Surgeon: Unk Donald Skiff, MD;  Location: [facility] ENDOSCOPY;  Service: Gastroenterology;;    Current Medications: Current Meds  Medication Sig   albuterol  (VENTOLIN  HFA) 108 (90 Base) MCG/ACT inhaler Inhale 2 puffs into the lungs every 6 (six) hours as needed for wheezing or shortness of breath. Please schedule office visit before any future refills   aluminum hydroxide-magnesium  carbonate (GAVISCON) 95-358 MG/15ML SUSP Take 15 mLs by mouth 4 (four) times daily - after meals and at bedtime.   amLODipine  (NORVASC ) 10 MG tablet Take 10 mg by mouth daily.    atorvastatin  (LIPITOR) 20 MG tablet Take 20 mg by mouth at bedtime.    budesonide-glycopyrrolate-formoterol (BREZTRI  AEROSPHERE) 160-9-4.8 MCG/ACT AERO inhaler Inhale 2 puffs into the lungs in the morning and at bedtime.   carvedilol  (COREG ) 6.25 MG tablet Take 1 tablet (6.25 mg total) by mouth 2 (two) times daily.   cetirizine (ZYRTEC) 10 MG tablet Take 10 mg by mouth at bedtime.   Cholecalciferol (VITAMIN D3) 125 MCG (5000 UT) CAPS Take 1 capsule by mouth daily.   famotidine  (PEPCID ) 20 MG tablet One after supper   losartan  (COZAAR ) 25 MG tablet Take  1 tablet (25 mg) by mouth once daily   magnesium  oxide (MAG-OX) 400 MG tablet Take 400 mg by mouth daily.   omeprazole  (PRILOSEC) 40 MG capsule Take 1 capsule (40 mg total) by mouth daily. Take 1 tablet (40 mg) by mouth once daily   SPIRIVA  HANDIHALER 18 MCG inhalation capsule Place 1 capsule into inhaler and inhale daily as needed. TAKES PRN FOR SHOB/WHEEZE     Allergies:    Patient has no known allergies.   Social History   Socioeconomic History   Marital status: Single    Spouse name: sister, Erminio   Number of children: 3   Years of education: Not on file   Highest education level: Not on file  Occupational History   Occupation: lobbyist    Comment: came out of retirement to work  Tobacco Use   Smoking status: Former    Current packs/day: 0.00    Average packs/day: 0.8 packs/day for 57.0 years (42.8 ttl pk-yrs)    Types: Cigarettes    Start date: 03/12/2018    Quit date: 2020    Years since quitting: 6.0   Smokeless tobacco: Never  Vaping Use   Vaping status: Former  Substance and Sexual Activity   Alcohol use: Not Currently    Comment: occasional   Drug use: Not Currently   Sexual activity: Not on file  Other Topics Concern   Not on file  Social History Narrative   Lives with sister, romero   Social Drivers of Health   Tobacco Use: Medium Risk (07/30/2024)   Patient History    Smoking Tobacco Use: Former    Smokeless Tobacco Use: Never    Passive Exposure: Not on Actuary Strain: Not on file  Food Insecurity: Not on file  Transportation Needs: Not on file  Physical Activity: Not on file  Stress: Not on file  Social Connections: Not on file  Depression (EYV7-0): Not on file  Alcohol Screen: Not on file  Housing: Not on file  Utilities: Not on file  Health Literacy: Not on file     Family History: The patient's family history includes Cancer in his father.  ROS:   Please see the history of present illness.     All other systems reviewed and are negative.  EKGs/Labs/Other Studies Reviewed:    The following studies were reviewed today:   EKG Interpretation Date/Time:  Monday July 30 2024 08:23:52 EST Ventricular Rate:  65 PR Interval:  176 QRS Duration:  92 QT Interval:  408 QTC Calculation: 424 R Axis:   65  Text Interpretation: Normal sinus rhythm Normal ECG Confirmed by Darliss Rogue ([id number]) on 07/30/2024 8:41:24 AM    Recent Labs: No results found for requested labs within last 365 days.  Recent Lipid Panel    Component Value Date/Time   CHOL 103 09/10/2022 0921   TRIG 68 09/10/2022 0921   HDL 31 (L) 09/10/2022 0921   CHOLHDL 3.3 09/10/2022 0921   VLDL 14 09/10/2022 0921   LDLCALC 58 09/10/2022 0921   LDLDIRECT 57 09/10/2022 0921     Risk Assessment/Calculations:          Physical Exam:    VS:  BP 130/68 (BP Location: Left Arm, Patient Position: Sitting, Cuff Size: Normal)   Pulse 65   Ht 5' 4 (1.626 m)   Wt 132 lb 6.4 oz (60.1 kg)   SpO2 96%   BMI 22.73 kg/m     Wt Readings from  Last 3 Encounters:  07/30/24 132 lb 6.4 oz (60.1 kg)  06/14/23 135 lb 2 oz (61.3 kg)  04/12/23 137 lb 9.6 oz (62.4 kg)     GEN:  Well nourished, well developed in no acute distress HEENT: Normal NECK: No JVD; No carotid bruits CARDIAC: RRR, no murmurs, rubs, gallops RESPIRATORY: Diminished breath sound bilaterally, no wheezing ABDOMEN: Soft, non-tender, non-distended MUSCULOSKELETAL:  No edema; No deformity  SKIN: Warm and dry NEUROLOGIC:  Alert and oriented x 3 PSYCHIATRIC:  Normal affect   ASSESSMENT:    1. Pre-procedural cardiovascular examination   2. Essential hypertension   3. Pure hypercholesterolemia    PLAN:    In order of problems listed above:  Preprocedural exam, colonoscopy being planned.  Echo 2/24 with normal EF 55 to 60%, impaired relaxation.  Lexiscan  Myoview  2/24 with no significant ischemia, low risk study.  Okay for colonoscopy from a cardiac perspective. Hypertension, BP controlled.  Continue Norvasc  10 mg daily, losartan  25 mg daily, Coreg  6.25 mg twice daily. Hyperlipidemia, cholesterol controlled.  Continue Lipitor 20 mg.  Follow-up as needed.   Medication Adjustments/Labs and Tests Ordered: Current medicines are reviewed at length with the patient today.  Concerns regarding medicines are outlined above.  Orders Placed  This Encounter  Procedures   EKG 12-Lead   No orders of the defined types were placed in this encounter.   Patient Instructions  Medication Instructions:  Your physician recommends that you continue on your current medications as directed. Please refer to the Current Medication list given to you today.   *If you need a refill on your cardiac medications before your next appointment, please call your pharmacy*  Lab Work: No labs ordered today  If you have labs (blood work) drawn today and your tests are completely normal, you will receive your results only by: MyChart Message (if you have MyChart) OR A paper copy in the mail If you have any lab test that is abnormal or we need to change your treatment, we will call you to review the results.  Testing/Procedures: No test ordered today   Follow-Up: At [facility], you and your health needs are our priority.  As part of our continuing mission to provide you with exceptional heart care, our providers are all part of one team.  This team includes your primary Cardiologist (physician) and Advanced Practice Providers or APPs (Physician Assistants and Nurse Practitioners) who all work together to provide you with the care you need, when you need it.  Your next appointment:   As needed  Provider:   You may see Redell Cave, MD or one of the following Advanced Practice Providers on your designated Care Team:   Lonni Meager, NP Lesley Maffucci, PA-C Bernardino Bring, PA-C Cadence Se[place]rook, PA-C Tylene Lunch, NP Barnie Hila, NP    We recommend signing up for the patient portal called MyChart.  Sign up information is provided on this After Visit Summary.  MyChart is used to connect with patients for Virtual Visits (Telemedicine).  Patients are able to view lab/test results, encounter notes, upcoming appointments, etc.  Non-urgent messages can be sent to your provider as well.   To learn more about what you can do with  MyChart, go to [email or web address].               Signed, Redell Cave, MD  07/30/2024 9:28 AM    [facility] "

## 2024-08-15 NOTE — Telephone Encounter (Signed)
 Per patient's office visit with Dr. Budd dated 07/30/24    Preprocedural exam, colonoscopy being planned.  Echo 2/24 with normal EF 55 to 60%, impaired relaxation.  Lexiscan  Myoview  2/24 with no significant ischemia, low risk study.  Okay for colonoscopy from a cardiac perspective. Hypertension, BP controlled.  Continue Norvasc  10 mg daily, losartan  25 mg daily, Coreg  6.25 mg twice daily. Hyperlipidemia, cholesterol controlled.  Continue Lipitor 20 mg.   Follow-up as needed.

## 2024-08-21 ENCOUNTER — Ambulatory Visit: Admission: RE | Admit: 2024-08-21 | Source: Home / Self Care | Admitting: Gastroenterology

## 2024-08-21 ENCOUNTER — Encounter: Admission: RE | Payer: Self-pay | Source: Home / Self Care

## 2024-08-21 SURGERY — COLONOSCOPY
Anesthesia: General
